# Patient Record
Sex: Male | Born: 1975 | Race: White | Hispanic: No | Marital: Married | Smoking: Former smoker
Health system: Southern US, Community
[De-identification: ages and names within clinical notes are randomized; demographics above are authoritative.]

## PROBLEM LIST (undated history)

## (undated) DIAGNOSIS — M7540 Impingement syndrome of unspecified shoulder: Secondary | ICD-10-CM

## (undated) DIAGNOSIS — K219 Gastro-esophageal reflux disease without esophagitis: Secondary | ICD-10-CM

## (undated) DIAGNOSIS — R519 Headache, unspecified: Secondary | ICD-10-CM

## (undated) DIAGNOSIS — Z8711 Personal history of peptic ulcer disease: Secondary | ICD-10-CM

## (undated) DIAGNOSIS — J342 Deviated nasal septum: Secondary | ICD-10-CM

## (undated) DIAGNOSIS — I10 Essential (primary) hypertension: Secondary | ICD-10-CM

## (undated) DIAGNOSIS — M19019 Primary osteoarthritis, unspecified shoulder: Secondary | ICD-10-CM

## (undated) DIAGNOSIS — M7521 Bicipital tendinitis, right shoulder: Secondary | ICD-10-CM

## (undated) DIAGNOSIS — Z8719 Personal history of other diseases of the digestive system: Secondary | ICD-10-CM

## (undated) DIAGNOSIS — J343 Hypertrophy of nasal turbinates: Secondary | ICD-10-CM

## (undated) HISTORY — PX: CHOLECYSTECTOMY: SHX55

---

## 2008-11-15 ENCOUNTER — Emergency Department: Payer: Self-pay | Admitting: Emergency Medicine

## 2009-08-21 ENCOUNTER — Emergency Department: Payer: Self-pay | Admitting: Emergency Medicine

## 2009-08-22 ENCOUNTER — Ambulatory Visit: Payer: Self-pay | Admitting: Emergency Medicine

## 2009-09-12 ENCOUNTER — Ambulatory Visit: Payer: Self-pay | Admitting: Surgery

## 2009-09-14 LAB — PATHOLOGY REPORT

## 2011-01-26 ENCOUNTER — Emergency Department: Payer: Self-pay | Admitting: Internal Medicine

## 2011-02-27 ENCOUNTER — Ambulatory Visit: Payer: Self-pay | Admitting: Orthopedic Surgery

## 2011-03-14 ENCOUNTER — Ambulatory Visit: Payer: Self-pay | Admitting: Neurology

## 2011-04-25 ENCOUNTER — Ambulatory Visit: Payer: Self-pay | Admitting: Rheumatology

## 2011-06-05 ENCOUNTER — Ambulatory Visit: Payer: Self-pay | Admitting: Neurology

## 2011-06-27 ENCOUNTER — Ambulatory Visit: Payer: Self-pay | Admitting: Physical Medicine and Rehabilitation

## 2011-09-04 ENCOUNTER — Encounter: Payer: Self-pay | Admitting: Physical Medicine and Rehabilitation

## 2011-09-19 ENCOUNTER — Encounter: Payer: Self-pay | Admitting: Physical Medicine and Rehabilitation

## 2011-10-28 ENCOUNTER — Ambulatory Visit: Payer: Self-pay | Admitting: Physical Medicine and Rehabilitation

## 2012-03-27 ENCOUNTER — Ambulatory Visit: Payer: Self-pay | Admitting: Family Medicine

## 2012-04-01 ENCOUNTER — Ambulatory Visit: Payer: Self-pay | Admitting: Family Medicine

## 2012-05-25 ENCOUNTER — Encounter: Payer: Self-pay | Admitting: Family Medicine

## 2012-05-25 ENCOUNTER — Other Ambulatory Visit: Payer: Self-pay | Admitting: *Deleted

## 2012-05-25 ENCOUNTER — Ambulatory Visit (INDEPENDENT_AMBULATORY_CARE_PROVIDER_SITE_OTHER): Payer: BC Managed Care – PPO | Admitting: Family Medicine

## 2012-05-25 VITALS — BP 140/98 | HR 83 | Temp 98.2°F | Ht 68.0 in | Wt 170.5 lb

## 2012-05-25 DIAGNOSIS — M542 Cervicalgia: Secondary | ICD-10-CM

## 2012-05-25 DIAGNOSIS — M797 Fibromyalgia: Secondary | ICD-10-CM

## 2012-05-25 DIAGNOSIS — M549 Dorsalgia, unspecified: Secondary | ICD-10-CM

## 2012-05-25 DIAGNOSIS — G43009 Migraine without aura, not intractable, without status migrainosus: Secondary | ICD-10-CM

## 2012-05-25 DIAGNOSIS — IMO0001 Reserved for inherently not codable concepts without codable children: Secondary | ICD-10-CM

## 2012-05-25 MED ORDER — AMITRIPTYLINE HCL 25 MG PO TABS: 25.0000 mg | ORAL_TABLET | Freq: Every day | ORAL | Status: DC

## 2012-05-25 MED ORDER — DICLOFENAC SODIUM 75 MG PO TBEC: 75.0000 mg | DELAYED_RELEASE_TABLET | Freq: Two times a day (BID) | ORAL | Status: DC

## 2012-05-25 NOTE — Patient Instructions (Addendum)
F/u 6 weeks 

## 2012-05-25 NOTE — Progress Notes (Signed)
Nature conservation officer at [facility] [address] [place] [address] Phone: [phone] Fax: [phone]  Date:  05/25/2012   Name:  Raymond [name]   DOB:  01-Apr-1975   MRN:  [id number] Gender: male Age: 37 y.o.  Primary Physician:  Hannah Beat, MD  Evaluating MD: Hannah Beat, MD   Chief Complaint: Establish Care   History of Present Illness:  Raymond [name] is a 37 y.o. pleasant patient who presents with the following:  Has been going to [facility].   Mom was in hospital with long QT syndrome, then heart in A Fib. Sister has long QT syndrome 1st cousin: long QT syndrome  He has an upcoming appointment with Dr. Graciela Husbands  Fibromyalgia / neck / back / diffuse muscle pains:  He has been through what sounds like a large workup of this. He said many x-rays, and actually has had cervical, thoracic, lumbar, and her RIGHT shoulder MRI. All these he reports to me are normal. He has not been given a formal diagnosis to his knowledge, but he is taking Naprosyn as well as gabapentin 1200 mg total daily. Gabapentin 300 mg 4 times a day --- But does not help  Naproxen -- does not help.   Neck, thoracic, lumbar, and R shoulder MRI - all ok.  Migraines --- gets one about every week. He is not on any prophylactic medicine.  He also has problems with insomnia.  Trigger point inj did not help. Dr. Burnett Sheng -- in [place].  There is no problem list on file for this patient.   Past Medical History  Diagnosis Date  . Migraine   . History of chicken pox     Past Surgical History  Procedure Laterality Date  . Cholecystectomy      History   Social History  . Marital Status: Married    Spouse Name: N/A    Number of Children: N/A  . Years of Education: N/A   Occupational History  . Not on file.   Social History Main Topics  . Smoking status: Current Every Day Smoker  . Smokeless tobacco: Not on file  . Alcohol Use: No  . Drug Use: No  . Sexually Active: Not  on file   Other Topics Concern  . Not on file   Social History Narrative  . No narrative on file    No family history on file.  Allergies  Allergen Reactions  . Azithromycin   . Percocet (Oxycodone-Acetaminophen)   . Vicodin (Hydrocodone-Acetaminophen)     Medication list has been reviewed and updated.  No outpatient prescriptions prior to visit.   No facility-administered medications prior to visit.    Review of Systems:   GEN: No acute illnesses, no fevers, chills. GI: No n/v/d, eating normally Pulm: No SOB Interactive and getting along well at home.  Otherwise, ROS is as per the HPI.   Physical Examination: BP 140/98  Pulse 83  Temp(Src) 98.2 F (36.8 C) (Oral)  Ht 5\' 8"  (1.727 m)  Wt 170 lb 8 oz (77.338 kg)  BMI 25.93 kg/m2  SpO2 97%  Ideal Body Weight: Weight in (lb) to have BMI = 25: 164.1   GEN: WDWN, NAD, Non-toxic, Alert & Oriented x 3 HEENT: Atraumatic, Normocephalic.  Ears and Nose: No external deformity. EXTR: No clubbing/cyanosis/edema NEURO: Normal gait.  PSYCH: Normally interactive. Conversant. Not depressed or anxious appearing.  Calm demeanor.   Shoulder: B Inspection: No muscle wasting or winging Ecchymosis/edema: neg  AC joint, scapula,  clavicle: NT Cervical spine: NT, full ROM Spurling's: neg Abduction: full, 5/5 Flexion: full, 5/5 IR, full, lift-off: 5/5 ER at neutral: full, 5/5 AC crossover and compression: neg Neer: neg Hawkins: neg Drop Test: neg Empty Can: neg Supraspinatus insertion: NT Bicipital groove: NT Speed's: neg Yergason's: neg Sulcus sign: neg Scapular dyskinesis: none C5-T1 intact Sensation intact Grip 5/5  CERVICAL SPINE EXAM Range of motion: Flexion, extension, lateral bending, and rotation: no significant limitation Pain with terminal motion: yes Spinous Processes: NT SCM: NT Upper paracervical muscles: ttp diffusely Upper traps: NT C5-T1 intact, sensation and motor   Assessment and  Plan:  Fibromyalgia  Back pain  Neck pain  Common migraine  Trial of Elavil for both fibromyalgia, migraine, and insomnia. Change from Naprosyn to Voltaren.  Obtain records from his prior physicians. Recheck in 6 weeks.  Orders Today:  No orders of the defined types were placed in this encounter.    Updated Medication List: (Includes new medications, updates to list, dose adjustments) Meds ordered this encounter  Medications  . DISCONTD: NAPROXEN DR 500 MG EC tablet    Sig: Take 1 tablet by mouth daily.  Marland Kitchen DISCONTD: gabapentin (NEURONTIN) 300 MG capsule    Sig: Take 300 mg by mouth 2 (two) times daily.  . diclofenac (VOLTAREN) 75 MG EC tablet    Sig: Take 1 tablet (75 mg total) by mouth 2 (two) times daily.    Dispense:  60 tablet    Refill:  3  . amitriptyline (ELAVIL) 25 MG tablet    Sig: Take 1 tablet (25 mg total) by mouth at bedtime.    Dispense:  30 tablet    Refill:  3    Medications Discontinued: Medications Discontinued During This Encounter  Medication Reason  . NAPROXEN DR 500 MG EC tablet   . gabapentin (NEURONTIN) 300 MG capsule       Signed, Cythia Bachtel T. Derion Kreiter, MD 05/25/2012 2:13 PM

## 2012-05-26 ENCOUNTER — Encounter: Payer: Self-pay | Admitting: Family Medicine

## 2012-05-26 DIAGNOSIS — G43009 Migraine without aura, not intractable, without status migrainosus: Secondary | ICD-10-CM | POA: Insufficient documentation

## 2012-05-26 DIAGNOSIS — M797 Fibromyalgia: Secondary | ICD-10-CM | POA: Insufficient documentation

## 2012-05-26 DIAGNOSIS — M542 Cervicalgia: Secondary | ICD-10-CM | POA: Insufficient documentation

## 2012-05-26 DIAGNOSIS — M549 Dorsalgia, unspecified: Secondary | ICD-10-CM | POA: Insufficient documentation

## 2012-05-28 ENCOUNTER — Telehealth: Payer: Self-pay

## 2012-05-28 NOTE — Telephone Encounter (Signed)
Raymond [name] pts wife said within the last year pt used voltaren gel for elbow pain and took voltaren tabs for back pain with no relief. [name] request substitution med be sent to [facility] instead of voltaren tab for back pain.Please advise.[name] request call back after med sent in.

## 2012-05-29 MED ORDER — MELOXICAM 15 MG PO TABS: 15.0000 mg | ORAL_TABLET | Freq: Every day | ORAL | Status: DC

## 2012-05-29 NOTE — Telephone Encounter (Signed)
PATIENT WIFE ADVISED AND MEDICATION SENT TO PHARMACY

## 2012-05-29 NOTE — Telephone Encounter (Signed)
Call  The treatment of fibromyalgia is complex and there is never 1 medication that will completely work.  Any help is of benefit.  D/c voltaren.  Start mobic 15 mg, 1 po daily, #30, 3 refills

## 2012-06-03 ENCOUNTER — Telehealth: Payer: Self-pay

## 2012-06-03 MED ORDER — TRAMADOL HCL 50 MG PO TABS: 50.0000 mg | ORAL_TABLET | Freq: Four times a day (QID) | ORAL | Status: DC | PRN

## 2012-06-03 MED ORDER — TIZANIDINE HCL 4 MG PO TABS: ORAL_TABLET | ORAL | Status: DC

## 2012-06-03 NOTE — Telephone Encounter (Signed)
Advised patient's wife, meds sent to [facility].

## 2012-06-03 NOTE — Telephone Encounter (Signed)
Call and call in  Zanaflex 4 mg tab, 1 po qhs, #30, 3 refills (muscle relaxant) Tramadol 50 mg, 1 po qid prn pain. #50, 3 refills

## 2012-06-03 NOTE — Telephone Encounter (Signed)
Pt's wife said Mobic is not helping pts upper back pain; tonya rubbed pts' upper back last night and felt thousands of knots in upper back; ? Muscle tension or spasms. Request pain med called to [facility].Please advise.

## 2012-06-12 ENCOUNTER — Ambulatory Visit (INDEPENDENT_AMBULATORY_CARE_PROVIDER_SITE_OTHER): Payer: BC Managed Care – PPO | Admitting: Internal Medicine

## 2012-06-12 ENCOUNTER — Encounter: Payer: Self-pay | Admitting: Internal Medicine

## 2012-06-12 VITALS — BP 150/98 | HR 74 | Ht 68.0 in | Wt 167.8 lb

## 2012-06-12 DIAGNOSIS — I469 Cardiac arrest, cause unspecified: Secondary | ICD-10-CM

## 2012-06-12 DIAGNOSIS — R002 Palpitations: Secondary | ICD-10-CM

## 2012-06-12 DIAGNOSIS — Z8249 Family history of ischemic heart disease and other diseases of the circulatory system: Secondary | ICD-10-CM

## 2012-06-12 DIAGNOSIS — F17200 Nicotine dependence, unspecified, uncomplicated: Secondary | ICD-10-CM

## 2012-06-12 DIAGNOSIS — R55 Syncope and collapse: Secondary | ICD-10-CM

## 2012-06-12 NOTE — Progress Notes (Signed)
ELECTROPHYSIOLOGY CONSULT NOTE  Patient ID: Raymond [name], MRN: [id number], DOB/AGE: 1975/03/08 37 y.o. Admit date: (Not on file) Date of Consult: 06/12/2012  Primary Physician: Hannah Beat, MD Primary Cardiologist: new  Chief Complaint: LONGqt   HPI Raymond [name] is a 37 y.o. male  A seen as part of the evaluation of [facility] with known long QT syndrome . Specifically his mother had polymorphic ventricular tachycardia and with his mother and his sister have QT intervals of greater than 500 ms. His mother is status post ICD implantation.  He had aborted sudden death as an infan t the details of which are unavailable  He also has a history of recurrence syncopeThese episodes occurred at work they're associated with diaphoresis nausea and flushing with residual orthostatic intoleranceshe   Past Medical History  Diagnosis Date  . Migraine   . Fibromyalgia       Surgical History:  Past Surgical History  Procedure Laterality Date  . Cholecystectomy       Home Meds: Prior to Admission medications   Medication Sig Start Date End Date Taking? Authorizing Provider  amitriptyline (ELAVIL) 25 MG tablet Take 1 tablet (25 mg total) by mouth at bedtime. 05/25/12  Yes Hannah Beat, MD  Cholecalciferol (VITAMIN D PO) Take 5,000 mg by mouth daily.   Yes Historical Provider, MD  meloxicam (MOBIC) 15 MG tablet Take 1 tablet (15 mg total) by mouth daily. 05/29/12  Yes [name], MD  tiZANidine (ZANAFLEX) 4 MG tablet Take one by mouth at bedtime. 06/03/12  Yes Hannah Beat, MD  traMADol (ULTRAM) 50 MG tablet Take 1 tablet (50 mg total) by mouth every 6 (six) hours as needed for pain. 06/03/12  Yes Hannah Beat, MD      Allergies:  Allergies  Allergen Reactions  . Azithromycin   . Percocet (Oxycodone-Acetaminophen)   . Vicodin (Hydrocodone-Acetaminophen)     History   Social History  . Marital Status: Married    Spouse Name: N/A    Number of Children: N/A  . Years  of Education: N/A   Occupational History  . Not on file.   Social History Main Topics  . Smoking status: Current Every Day Smoker    Types: Cigarettes  . Smokeless tobacco: Never Used  . Alcohol Use: Yes  . Drug Use: No  . Sexually Active: Yes -- Male partner(s)   Other Topics Concern  . Not on file   Social History Narrative   Mom was in hospital with long QT syndrome, then heart in A Fib.   Sister has long QT syndrome   1st cousin: long QT syndrome           No family history on file.   ROS:  Please see the history of present illness.   Negative except fibormalgia  All other systems reviewed and negative.    Physical Exam:   Blood pressure 150/98, pulse 74, height 5\' 8"  (1.727 m), weight 167 lb 12.8 oz (76.114 kg). General: Well developed, well nourished male in no acute distress. Head: Normocephalic, atraumatic, sclera non-icteric, no xanthomas, nares are without discharge. EENT: normal Lymph Nodes:  none Back: without scoliosis/kyphosis , no CVA tendersness Neck: Negative for carotid bruits. JVD not elevated. Lungs: Clear bilaterally to auscultation without wheezes, rales, or rhonchi. Breathing is unlabored. Heart: RRR with S1 S2. No murmur , rubs, or gallops appreciated. Abdomen: Soft, non-tender, non-distended with normoactive bowel sounds. No hepatomegaly. No rebound/guarding. No obvious abdominal masses. Msk:  Strength and  tone appear normal for age. Extremities: No clubbing or cyanosis. No edema.  Distal pedal pulses are 2+ and equal bilaterally. Skin: Warm and Dry Neuro: Alert and oriented X 3. CN III-XII intact Grossly normal sensory and motor function . Psych:  Responds to questions appropriately with a normal affect.       EKG: *   Assessment and Plan:    Sherryl Manges

## 2012-06-12 NOTE — Patient Instructions (Addendum)
Your physician recommends that you schedule a follow-up appointment in: 6 weeks with Dr Graciela Husbands to go over event monitor  Your physician has recommended that you wear an event monitor. Event monitors are medical devices that record the heart's electrical activity. Doctors most often [place] these monitors to diagnose arrhythmias. Arrhythmias are problems with the speed or rhythm of the heartbeat. The monitor is a small, portable device. You can wear one while you do your normal daily activities. This is usually used to diagnose what is causing palpitations/syncope (passing out).

## 2012-06-12 NOTE — Assessment & Plan Note (Addendum)
The patient apparently has a history of aborted cardiac arrest occurring one month. 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 I do not think his syncopal episodes represent polymorphic ventricular tachycardia given her duration. genetic testing is a delay in the [facility]. In the event that we identify  A probe, we will explore the rest of the family including this patient  Notably, they have 4 children none of whom has had syncope

## 2012-06-12 NOTE — Assessment & Plan Note (Signed)
As above.

## 2012-06-12 NOTE — Assessment & Plan Note (Signed)
We have discussed strategies to try to discontinue smoking including lozenges and patches. I've also given the number for the state smoking cessation program

## 2012-06-17 ENCOUNTER — Telehealth: Payer: Self-pay

## 2012-06-17 NOTE — Telephone Encounter (Signed)
pts wife, Archie Patten left v/m pt saw Dr Graciela Husbands and the Elavil was discontinued. Pts mother has long QT syndrome. Pt to stop Elavil until decide if pt has QT syndrome. Pt has f/u with Dr Patsy Lager on 07/08/12. Archie Patten does not require call back.

## 2012-06-17 NOTE — Telephone Encounter (Signed)
Noted   Hannah Beat, MD 06/17/2012, 5:05 PM

## 2012-06-19 ENCOUNTER — Ambulatory Visit (INDEPENDENT_AMBULATORY_CARE_PROVIDER_SITE_OTHER): Payer: BC Managed Care – PPO

## 2012-06-19 DIAGNOSIS — R002 Palpitations: Secondary | ICD-10-CM

## 2012-06-19 NOTE — Progress Notes (Signed)
Placed a 30 day event monitor on patient 

## 2012-06-22 ENCOUNTER — Telehealth: Payer: Self-pay

## 2012-06-22 NOTE — Telephone Encounter (Signed)
Call  This was used more for pain management and headaches - with added se of making sleepy.  Until given ok by Dr. Graciela Husbands, short term Remus Loffler use is reasonable.  Ambien 10 mg, 1/2-1 tab po 30 minutes before bed., #30, 1 refill

## 2012-06-22 NOTE — Telephone Encounter (Signed)
pts wife request med to help pt sleep that is not on Long QT list(per Dr Graciela Husbands). Pt could not take Elavil. [facility].

## 2012-06-23 NOTE — Telephone Encounter (Signed)
rx called to pharmacy. Patients wife advised

## 2012-06-30 ENCOUNTER — Telehealth: Payer: Self-pay | Admitting: Internal Medicine

## 2012-06-30 NOTE — Telephone Encounter (Signed)
New problem    Pt having muscle spasms and wants to know what he can take

## 2012-06-30 NOTE — Telephone Encounter (Signed)
Spoke with pt wife, she reports the muscle in his forearm is jumping and they wonder what he can take. Explained he could take a muscle relaxer but he would need to get that from his PCP. She voiced understanding and states he already has a scheduled appt with them.

## 2012-07-07 ENCOUNTER — Encounter: Payer: Self-pay | Admitting: Radiology

## 2012-07-08 ENCOUNTER — Encounter: Payer: Self-pay | Admitting: Family Medicine

## 2012-07-08 ENCOUNTER — Ambulatory Visit (INDEPENDENT_AMBULATORY_CARE_PROVIDER_SITE_OTHER): Payer: BC Managed Care – PPO | Admitting: Family Medicine

## 2012-07-08 VITALS — BP 130/80 | HR 72 | Temp 98.6°F | Ht 68.0 in | Wt 168.0 lb

## 2012-07-08 DIAGNOSIS — M797 Fibromyalgia: Secondary | ICD-10-CM

## 2012-07-08 DIAGNOSIS — IMO0001 Reserved for inherently not codable concepts without codable children: Secondary | ICD-10-CM

## 2012-07-08 MED ORDER — PREGABALIN 75 MG PO CAPS: 75.0000 mg | ORAL_CAPSULE | Freq: Every day | ORAL | Status: DC

## 2012-07-08 NOTE — Progress Notes (Signed)
Nature conservation officer at [facility] [address] [place] [address] Phone: [phone] Fax: [phone]  Date:  07/08/2012   Name:  Raymond [name]   DOB:  Jul 16, 1975   MRN:  [id number] Gender: male Age: 37 y.o.  Primary Physician:  Hannah Beat, MD  Evaluating MD: Hannah Beat, MD   Chief Complaint: Follow-up   History of Present Illness:  Raymond [name] is a 37 y.o. pleasant patient who presents with the following:  05/25/2012 OV: Extensive record review. The patient has been treated by orthopedics, rheumatology, physical medicine and rehabilitation, chiropractor, physical therapy, and none of these have really worked at all.  MRI reports scanned in the system and reviewed. Prior notes reviewed.  Recently, we tried the patient on some Zanaflex, Mobic, and initially we tried some Elavil, but we discontinued this  On Dr. Odessa Fleming recommendation while he is undergoing a workup for long QT syndrome.  The tramadol does help somewhat. Elavil made him feel agitated and stay up later than normal. He also recently gave him some Ambien, which did help somewhat with some sleep.  He has also previously been on high-dose gabapentin, which did not help at all.   Has been going to [facility].   Mom was in hospital with long QT syndrome, then heart in A Fib. Sister has long QT syndrome 1st cousin: long QT syndrome  He has an upcoming appointment with Dr. Graciela Husbands  Fibromyalgia / neck / back / diffuse muscle pains:   He has been through what sounds like a large workup of this. He said many x-rays, and actually has had cervical, thoracic, lumbar, and her RIGHT shoulder MRI. All these he reports to me are normal. He has not been given a formal diagnosis to his knowledge, but he is taking Naprosyn as well as gabapentin 1200 mg total daily. Gabapentin 300 mg 4 times a day --- But does not help  Naproxen -- does not help.   Neck, thoracic, lumbar, and R shoulder MRI - all  ok.  Migraines --- gets one about every week. He is not on any prophylactic medicine.  He also has problems with insomnia.  Trigger point inj did not help. Dr. Burnett Sheng -- in [place].   Patient Active Problem List   Diagnosis Date Noted  . Syncope 06/12/2012  . Family history of long QT syndrome 06/12/2012  . 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 Smoker 06/12/2012  . Common migraine 05/26/2012  . Neck pain 05/26/2012  . Back pain 05/26/2012  . Fibromyalgia 05/26/2012    Past Medical History  Diagnosis Date  . Migraine   . Fibromyalgia     Past Surgical History  Procedure Laterality Date  . Cholecystectomy      History   Social History  . Marital Status: Married    Spouse Name: N/A    Number of Children: N/A  . Years of Education: N/A   Occupational History  . Not on file.   Social History Main Topics  . Smoking status: Current Every Day Smoker    Types: Cigarettes  . Smokeless tobacco: Never Used  . Alcohol Use: Yes  . Drug Use: No  . Sexually Active: Yes -- Male partner(s)   Other Topics Concern  . Not on file   Social History Narrative   Mom was in hospital with long QT syndrome, then heart in A Fib.   Sister has long QT syndrome   1st cousin: long QT syndrome  No family history on file.  Allergies  Allergen Reactions  . Azithromycin   . Percocet (Oxycodone-Acetaminophen)   . Vicodin (Hydrocodone-Acetaminophen)     Medication list has been reviewed and updated.  Outpatient Prescriptions Prior to Visit  Medication Sig Dispense Refill  . Cholecalciferol (VITAMIN D PO) Take 5,000 mg by mouth daily.      . meloxicam (MOBIC) 15 MG tablet Take 1 tablet (15 mg total) by mouth daily.  30 tablet  3  . tiZANidine (ZANAFLEX) 4 MG tablet Take one by mouth at bedtime.  30 tablet  3  . traMADol (ULTRAM) 50 MG tablet Take 1 tablet (50 mg total) by mouth every 6 (six) hours as needed for pain.  50 tablet  3  . amitriptyline (ELAVIL) 25 MG tablet  Take 1 tablet (25 mg total) by mouth at bedtime.  30 tablet  3   No facility-administered medications prior to visit.    Review of Systems:   GEN: No fevers, chills. Nontoxic. Primarily MSK c/o today. MSK: Detailed in the HPI GI: tolerating PO intake without difficulty Neuro: No numbness, parasthesias, or tingling associated. Otherwise the pertinent positives of the ROS are noted above.    Physical Examination: BP 130/80  Pulse 72  Temp(Src) 98.6 F (37 C) (Oral)  Ht 5\' 8"  (1.727 m)  Wt 168 lb (76.204 kg)  BMI 25.55 kg/m2  SpO2 97%  Ideal Body Weight: Weight in (lb) to have BMI = 25: 164.1   GEN: WDWN, NAD, Non-toxic, Alert & Oriented x 3 HEENT: Atraumatic, Normocephalic.  Ears and Nose: No external deformity. EXTR: No clubbing/cyanosis/edema NEURO: Normal gait.  PSYCH: Normally interactive. Conversant. Not depressed or anxious appearing.  Calm demeanor.   Shoulder: R Inspection: No muscle wasting or winging Ecchymosis/edema: neg  AC joint, scapula, clavicle: NT Cervical spine: NT, full ROM Spurling's: neg Abduction: full, 5/5 Flexion: full, 5/5 IR, full, lift-off: 5/5 ER at neutral: full, 5/5 AC crossover and compression: neg Neer: neg Hawkins: neg Drop Test: neg Empty Can: neg Supraspinatus insertion: NT Bicipital groove: NT Speed's: neg Yergason's: neg Sulcus sign: neg Scapular dyskinesis: none C5-T1 intact Sensation intact Grip 5/5  Cervical spine and lumbar spine with full range of motion. Nontender throughout the spinous processes.  Assessment and Plan:  Fibromyalgia   >25 minutes spent in face to face time with patient, >50% spent in counselling or coordination of care: challenging case. He has undergone an extensive workup radiographically as well as a laboratory workup by multiple specialists. Both shoulders are incredibly strong, and clinically does not seem consistent with rotator cuff pathology based on examination.  Overarching pattern  seems to be most consistent with fibromyalgia. Emphasized importance of sleep, exercise, and we can try various neuropathic agents. I encouraged him the tramadol is safe and has a high success rate in fibromyalgia. Trial of Lyrica 75 mg at night, then increase to b.i.d. On the weekend and assess level of drowsiness. We can titrate this up to affect.  Followup in 6-8 weeks.  Orders Today:  No orders of the defined types were placed in this encounter.    Updated Medication List: (Includes new medications, updates to list, dose adjustments) Meds ordered this encounter  Medications  . zolpidem (AMBIEN) 10 MG tablet    Sig: Take 1 tablet by mouth daily.  . pregabalin (LYRICA) 75 MG capsule    Sig: Take 1 capsule (75 mg total) by mouth at bedtime.    Dispense:  30 capsule  Refill:  5    Medications Discontinued: Medications Discontinued During This Encounter  Medication Reason  . amitriptyline (ELAVIL) 25 MG tablet Error  . tiZANidine (ZANAFLEX) 4 MG tablet      Signed, Salaya Holtrop T. Fuquan Wilson, MD 07/08/2012 3:57 PM

## 2012-07-28 ENCOUNTER — Encounter: Payer: Self-pay | Admitting: Internal Medicine

## 2012-07-28 ENCOUNTER — Ambulatory Visit (INDEPENDENT_AMBULATORY_CARE_PROVIDER_SITE_OTHER): Payer: BC Managed Care – PPO | Admitting: Internal Medicine

## 2012-07-28 VITALS — BP 127/90 | HR 65 | Ht 68.0 in | Wt 166.2 lb

## 2012-07-28 DIAGNOSIS — I469 Cardiac arrest, cause unspecified: Secondary | ICD-10-CM

## 2012-07-28 DIAGNOSIS — Z8249 Family history of ischemic heart disease and other diseases of the circulatory system: Secondary | ICD-10-CM

## 2012-07-28 NOTE — Progress Notes (Signed)
Patient Care Team: Hannah Beat, MD as PCP - General (Family Medicine)   HPI  Raymond [name] is a 37 y.o. male Seen in followup for palpitations. As part of an evaluation of [facility] with known long QT syndrome. Patient himself had an aborted cardiac event as an infant the details of which are not  Await gene testing although phone calll?? Suggested negative  Past Medical History  Diagnosis Date  . Migraine   . Fibromyalgia     Past Surgical History  Procedure Laterality Date  . Cholecystectomy      Current Outpatient Prescriptions  Medication Sig Dispense Refill  . Cholecalciferol (VITAMIN D PO) Take 5,000 mg by mouth daily.      . meloxicam (MOBIC) 15 MG tablet Take 1 tablet (15 mg total) by mouth daily.  30 tablet  3  . pregabalin (LYRICA) 75 MG capsule Take 1 capsule (75 mg total) by mouth at bedtime.  30 capsule  5  . traMADol (ULTRAM) 50 MG tablet Take 1 tablet (50 mg total) by mouth every 6 (six) hours as needed for pain.  50 tablet  3  . zolpidem (AMBIEN) 10 MG tablet Take 1 tablet by mouth daily.       No current facility-administered medications for this visit.    Allergies  Allergen Reactions  . Azithromycin   . Percocet (Oxycodone-Acetaminophen)   . Vicodin (Hydrocodone-Acetaminophen)     Review of Systems negative except from HPI and PMH  Physical Exam BP 127/90  Pulse 65  Ht 5\' 8"  (1.727 m)  Wt 166 lb 3.2 oz (75.388 kg)  BMI 25.28 kg/m2 Well developed and nourished in no acute distress HENT normal Neck supple with JVP-flat Clear Regular rate and rhythm, no murmurs or gallops Abd-soft with active BS No Clubbing cyanosis edema Skin-warm and dry A & Oriented  Grossly normal sensory and motor function  Event recorder reviewed. Rare PVCs. Symptoms of chest pressure and palpitations are associated with sinus rhythm and occasionally with PVCs   Assessment and  Plan

## 2012-07-28 NOTE — Assessment & Plan Note (Signed)
Details still not available.

## 2012-07-28 NOTE — Assessment & Plan Note (Signed)
First word that the gene screen was negative. We'll need to consider the use of exercise testing to evaluate likelihood in the rest of the [facility]

## 2012-08-05 ENCOUNTER — Encounter: Payer: Self-pay | Admitting: Family Medicine

## 2012-08-05 ENCOUNTER — Ambulatory Visit (INDEPENDENT_AMBULATORY_CARE_PROVIDER_SITE_OTHER): Payer: BC Managed Care – PPO | Admitting: Family Medicine

## 2012-08-05 VITALS — BP 112/76 | HR 68 | Temp 98.1°F | Wt 168.5 lb

## 2012-08-05 DIAGNOSIS — M542 Cervicalgia: Secondary | ICD-10-CM

## 2012-08-05 MED ORDER — PREGABALIN 75 MG PO CAPS: 75.0000 mg | ORAL_CAPSULE | Freq: Two times a day (BID) | ORAL | Status: DC

## 2012-08-05 NOTE — Patient Instructions (Addendum)
[email or web address]

## 2012-08-05 NOTE — Progress Notes (Signed)
Nature conservation officer at [facility] [address] [place] [address] Phone: [phone] Fax: [phone]  Date:  08/05/2012   Name:  Raymond [name]   DOB:  Feb 10, 1976   MRN:  [id number] Gender: male Age: 37 y.o.  Primary Physician:  Hannah Beat, MD  Evaluating MD: Hannah Beat, MD   Chief Complaint: Neck Injury   History of Present Illness:  Raymond [name] is a 37 y.o. pleasant patient who presents with the following:  Last Thursday, couple days --- 6 days ago. Has been getting better, still has a burning.  Nice patient with a history of chronic pain, fibromyalgia, wrestling with son, caught in a guillotine, and now has posterior neck pain, mostly on the left. Has been improving. Taking some mobic.  Overall, pain is getting better since starting lyrica.  Cont with tramadol prn  Patient Active Problem List   Diagnosis Date Noted  . Syncope 06/12/2012  . Family history of long QT syndrome 06/12/2012  . 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 Smoker 06/12/2012  . Common migraine 05/26/2012  . Neck pain 05/26/2012  . Back pain 05/26/2012  . Fibromyalgia 05/26/2012    Past Medical History  Diagnosis Date  . Migraine   . Fibromyalgia     Past Surgical History  Procedure Laterality Date  . Cholecystectomy      History   Social History  . Marital Status: Married    Spouse Name: N/A    Number of Children: N/A  . Years of Education: N/A   Occupational History  . Not on file.   Social History Main Topics  . Smoking status: Former Smoker    Types: Cigarettes    Quit date: 06/12/2012  . Smokeless tobacco: Never Used  . Alcohol Use: No  . Drug Use: No  . Sexually Active: Yes -- Male partner(s)   Other Topics Concern  . Not on file   Social History Narrative   Mom was in hospital with long QT syndrome, then heart in A Fib.   Sister has long QT syndrome   1st cousin: long QT syndrome          No family history on file.  Allergies    Allergen Reactions  . Azithromycin   . Percocet (Oxycodone-Acetaminophen)   . Vicodin (Hydrocodone-Acetaminophen)     Medication list has been reviewed and updated.  Outpatient Prescriptions Prior to Visit  Medication Sig Dispense Refill  . Cholecalciferol (VITAMIN D PO) Take 5,000 mg by mouth daily.      . meloxicam (MOBIC) 15 MG tablet Take 1 tablet (15 mg total) by mouth daily.  30 tablet  3  . pregabalin (LYRICA) 75 MG capsule Take 1 capsule (75 mg total) by mouth at bedtime.  30 capsule  5  . traMADol (ULTRAM) 50 MG tablet Take 1 tablet (50 mg total) by mouth every 6 (six) hours as needed for pain.  50 tablet  3  . zolpidem (AMBIEN) 10 MG tablet Take 1 tablet by mouth daily.       No facility-administered medications prior to visit.    Review of Systems:   GEN: No fevers, chills. Nontoxic. Primarily MSK c/o today. MSK: Detailed in the HPI GI: tolerating PO intake without difficulty Neuro: No numbness, parasthesias, or tingling associated. Otherwise the pertinent positives of the ROS are noted above.    Physical Examination: BP 112/76  Pulse 68  Temp(Src) 98.1 F (36.7 C) (Oral)  Wt 168 lb 8 oz (76.431  kg)  BMI 25.63 kg/m2  Ideal Body Weight:     GEN: Well-developed,well-nourished,in no acute distress; alert,appropriate and cooperative throughout examination HEENT: Normocephalic and atraumatic without obvious abnormalities. Ears, externally no deformities PULM: Breathing comfortably in no respiratory distress EXT: No clubbing, cyanosis, or edema PSYCH: Normally interactive. Cooperative during the interview. Pleasant. Friendly and conversant. Not anxious or depressed appearing. Normal, full affect.  CERVICAL SPINE EXAM Range of motion: Flexion, extension, lateral bending, and rotation: mild forward flexion rest only Pain with terminal motion: mild lateral and flexion Spinous Processes: NT SCM: NT Upper paracervical muscles: ttp post Upper traps: NT C5-T1  intact, sensation and motor   Assessment and Plan:  Neck pain on left side  C/w moist heat, massage, mobic, tramadol Increase lyrica to bid  Orders Today:  No orders of the defined types were placed in this encounter.    Updated Medication List: (Includes new medications, updates to list, dose adjustments) Meds ordered this encounter  Medications  . pregabalin (LYRICA) 75 MG capsule    Sig: Take 1 capsule (75 mg total) by mouth 2 (two) times daily.    Dispense:  60 capsule    Refill:  5    Medications Discontinued: Medications Discontinued During This Encounter  Medication Reason  . pregabalin (LYRICA) 75 MG capsule Reorder      Signed, Karleen Hampshire T. Tasheba Henson, MD 08/05/2012 9:59 AM

## 2012-09-18 ENCOUNTER — Telehealth: Payer: Self-pay | Admitting: *Deleted

## 2012-09-18 NOTE — Telephone Encounter (Signed)
Pt's wife states pt is asking if his lyrica dose can be increased to two twice a day.  It seems to be helping him, but pt feels he needs a higher dose, pain is worse around lunch time.  Please advise.  [facility]  Pt has appt to see you on Monday.

## 2012-09-19 NOTE — Telephone Encounter (Signed)
Yes, i will discuss on Monday with him.

## 2012-09-21 ENCOUNTER — Encounter: Payer: Self-pay | Admitting: Family Medicine

## 2012-09-21 ENCOUNTER — Ambulatory Visit (INDEPENDENT_AMBULATORY_CARE_PROVIDER_SITE_OTHER): Payer: BC Managed Care – PPO | Admitting: Family Medicine

## 2012-09-21 VITALS — BP 100/64 | HR 80 | Temp 98.2°F | Ht 68.0 in | Wt 168.8 lb

## 2012-09-21 DIAGNOSIS — IMO0001 Reserved for inherently not codable concepts without codable children: Secondary | ICD-10-CM

## 2012-09-21 DIAGNOSIS — M797 Fibromyalgia: Secondary | ICD-10-CM

## 2012-09-21 DIAGNOSIS — M542 Cervicalgia: Secondary | ICD-10-CM

## 2012-09-21 DIAGNOSIS — M549 Dorsalgia, unspecified: Secondary | ICD-10-CM

## 2012-09-21 MED ORDER — SILDENAFIL CITRATE 100 MG PO TABS: 100.0000 mg | ORAL_TABLET | ORAL | Status: DC | PRN

## 2012-09-21 MED ORDER — PREGABALIN 100 MG PO CAPS: 100.0000 mg | ORAL_CAPSULE | Freq: Three times a day (TID) | ORAL | Status: DC

## 2012-09-21 NOTE — Progress Notes (Signed)
Nature conservation officer at [facility] [address] [place] [address] Phone: [phone] Fax: [phone]  Date:  09/21/2012   Name:  Raymond [name]   DOB:  24-Sep-1975   MRN:  [id number] Gender: male Age: 37 y.o.  Primary Physician:  Hannah Beat, MD  Evaluating MD: Hannah Beat, MD   Chief Complaint: Follow-up   History of Present Illness:  Raymond [name] is a 37 y.o. pleasant patient who presents with the following:  Last couple of days, has had some bad days, some swelling and felt like has been hard to swallow.  His been having more global pain and more neck pain.  He also wanted to Dr. about some erectile dysfunction. He is been having some problems over the last 2 or 3 years. Is the first time he is brought up to me. He is able to achieve erections sometimes, but he does lose his sometimes.   Patient Active Problem List   Diagnosis Date Noted  . Syncope 06/12/2012  . Family history of long QT syndrome 06/12/2012  . 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 Smoker 06/12/2012  . Common migraine 05/26/2012  . Neck pain 05/26/2012  . Back pain 05/26/2012  . Fibromyalgia 05/26/2012    Past Medical History  Diagnosis Date  . Migraine   . Fibromyalgia     Past Surgical History  Procedure Laterality Date  . Cholecystectomy      History   Social History  . Marital Status: Married    Spouse Name: N/A    Number of Children: N/A  . Years of Education: N/A   Occupational History  . Not on file.   Social History Main Topics  . Smoking status: Former Smoker    Types: Cigarettes    Quit date: 06/12/2012  . Smokeless tobacco: Never Used  . Alcohol Use: No  . Drug Use: No  . Sexually Active: Yes -- Male partner(s)   Other Topics Concern  . Not on file   Social History Narrative   Mom was in hospital with long QT syndrome, then heart in A Fib.   Sister has long QT syndrome   1st cousin: long QT syndrome          No family history on  file.  Allergies  Allergen Reactions  . Azithromycin   . Percocet (Oxycodone-Acetaminophen)   . Vicodin (Hydrocodone-Acetaminophen)     Medication list has been reviewed and updated.  Outpatient Prescriptions Prior to Visit  Medication Sig Dispense Refill  . Cholecalciferol (VITAMIN D PO) Take 5,000 mg by mouth daily.      . meloxicam (MOBIC) 15 MG tablet Take 1 tablet (15 mg total) by mouth daily.  30 tablet  3  . pregabalin (LYRICA) 75 MG capsule Take 1 capsule (75 mg total) by mouth 2 (two) times daily.  60 capsule  5  . traMADol (ULTRAM) 50 MG tablet Take 1 tablet (50 mg total) by mouth every 6 (six) hours as needed for pain.  50 tablet  3  . zolpidem (AMBIEN) 10 MG tablet Take 1 tablet by mouth daily.       No facility-administered medications prior to visit.    Review of Systems:  As above. No fevers, chills, sweats. No URI symptoms. No chest pain.  Physical Examination: BP 100/64  Pulse 80  Temp(Src) 98.2 F (36.8 C) (Oral)  Ht 5\' 8"  (1.727 m)  Wt 168 lb 12 oz (76.544 kg)  BMI 25.66 kg/m2  Ideal Body  Weight: Weight in (lb) to have BMI = 25: 164.1   GEN: WDWN, NAD, Non-toxic, Alert & Oriented x 3 HEENT: Atraumatic, Normocephalic.  Ears and Nose: No external deformity. EXTR: No clubbing/cyanosis/edema NEURO: Normal gait.  PSYCH: Normally interactive. Conversant. Not depressed or anxious appearing.  Calm demeanor.  Neck range of motion is full. Nontender no pain. Mild tenderness in the anterior aspect of the sternocleidomastoid. I do not appreciate any significant enlargement in the submental or parotid gland. No significant lymphadenopathy.  Assessment and Plan:  Neck pain  Back pain  Fibromyalgia  Titrate up the patient's dose of Lyrica. Trial of Viagra.  Orders Today:  No orders of the defined types were placed in this encounter.    Updated Medication List: (Includes new medications, updates to list, dose adjustments) Meds ordered this encounter   Medications  . pregabalin (LYRICA) 100 MG capsule    Sig: Take 1 capsule (100 mg total) by mouth 3 (three) times daily.    Dispense:  90 capsule    Refill:  5  . sildenafil (VIAGRA) 100 MG tablet    Sig: Take 1 tablet (100 mg total) by mouth as needed for erectile dysfunction.    Dispense:  10 tablet    Refill:  11    Medications Discontinued: Medications Discontinued During This Encounter  Medication Reason  . pregabalin (LYRICA) 75 MG capsule Reorder      Signed, Karleen Hampshire T. Alante Tolan, MD 09/21/2012 10:38 AM

## 2012-10-09 ENCOUNTER — Other Ambulatory Visit: Payer: Self-pay | Admitting: Family Medicine

## 2012-11-16 ENCOUNTER — Other Ambulatory Visit: Payer: Self-pay | Admitting: Family Medicine

## 2012-11-16 NOTE — Telephone Encounter (Signed)
#  50, 5 ref  ok

## 2012-11-16 NOTE — Telephone Encounter (Signed)
Called to pharmacy 

## 2012-11-16 NOTE — Telephone Encounter (Signed)
Last OV 09/21/2012.  Ok to refill?

## 2012-11-20 ENCOUNTER — Other Ambulatory Visit: Payer: Self-pay | Admitting: Family Medicine

## 2012-11-20 NOTE — Telephone Encounter (Signed)
Last office visit 09/21/2012.  Ok to refill? 

## 2012-11-20 NOTE — Telephone Encounter (Signed)
Called to [facility] 

## 2012-11-20 NOTE — Telephone Encounter (Signed)
Ok to refill #30, 3 refills

## 2012-12-24 ENCOUNTER — Other Ambulatory Visit: Payer: Self-pay

## 2013-01-18 ENCOUNTER — Encounter: Payer: Self-pay | Admitting: Family Medicine

## 2013-01-18 ENCOUNTER — Ambulatory Visit (INDEPENDENT_AMBULATORY_CARE_PROVIDER_SITE_OTHER): Payer: BC Managed Care – PPO | Admitting: Family Medicine

## 2013-01-18 VITALS — BP 140/100 | HR 69 | Temp 98.2°F | Ht 68.0 in | Wt 185.2 lb

## 2013-01-18 DIAGNOSIS — J309 Allergic rhinitis, unspecified: Secondary | ICD-10-CM

## 2013-01-18 DIAGNOSIS — M797 Fibromyalgia: Secondary | ICD-10-CM

## 2013-01-18 DIAGNOSIS — IMO0001 Reserved for inherently not codable concepts without codable children: Secondary | ICD-10-CM

## 2013-01-18 DIAGNOSIS — H6692 Otitis media, unspecified, left ear: Secondary | ICD-10-CM

## 2013-01-18 DIAGNOSIS — H6690 Otitis media, unspecified, unspecified ear: Secondary | ICD-10-CM

## 2013-01-18 MED ORDER — PREGABALIN 150 MG PO CAPS: 150.0000 mg | ORAL_CAPSULE | Freq: Three times a day (TID) | ORAL | Status: DC

## 2013-01-18 MED ORDER — FLUTICASONE PROPIONATE 50 MCG/ACT NA SUSP: 2.0000 | Freq: Every day | NASAL | Status: DC

## 2013-01-18 MED ORDER — AMOXICILLIN-POT CLAVULANATE 875-125 MG PO TABS: 1.0000 | ORAL_TABLET | Freq: Two times a day (BID) | ORAL | Status: DC

## 2013-01-18 NOTE — Progress Notes (Signed)
Date:  01/18/2013   Name:  Raymond [name]   DOB:  May 01, 1975   MRN:  [id number] Gender: male Age: 37 y.o.  Primary Physician:  Hannah Beat, MD   Chief Complaint: Otalgia   Subjective:   History of Present Illness:  Raymond [name] is a 37 y.o. very pleasant male patient who presents with the following:  Ears and sinuses are swollen and left ear is hurting. A lot of congestion, pressure, and pain behind his left ear. Some sinus congestion. Mild coughing.  L OM AR, swollen sinuses  BP Readings from Last 3 Encounters:  01/18/13 140/100  09/21/12 100/64  08/05/12 112/76   More neck pain / pain in general, but the lyrica is working better for him than anything else before.   Past Medical History, Surgical History, Social History, Family History, Problem List, Medications, and Allergies have been reviewed and updated if relevant.  Review of Systems: ROS: GEN: Acute illness details above GI: Tolerating PO intake GU: maintaining adequate hydration and urination Pulm: No SOB Interactive and getting along well at home.  Otherwise, ROS is as per the HPI.   Objective:   Physical Examination: BP 140/100  Pulse 69  Temp(Src) 98.2 F (36.8 C) (Oral)  Ht 5\' 8"  (1.727 m)  Wt 185 lb 4 oz (84.029 kg)  BMI 28.17 kg/m2   Gen: WDWN, NAD; A & O x3, cooperative. Pleasant.Globally Non-toxic HEENT: Normocephalic and atraumatic. Throat clear, w/o exudate, R TM clear, L TM - obscured landmarks, bulging. rhinnorhea.  MMM Frontal sinuses: NT Max sinuses: NT NECK: Anterior cervical  LAD is absent CV: RRR, No M/G/R, cap refill <2 sec PULM: Breathing comfortably in no respiratory distress. no wheezing, crackles, rhonchi EXT: No c/c/e PSYCH: Friendly, good eye contact MSK: Nml gait     Laboratory and Imaging Data:  Assessment & Plan:    Left otitis media  Fibromyalgia  Allergic rhinitis  Restart flonase Start augmentin Increase lyrica  There are no Patient  Instructions on file for this visit.  Orders Today:  No orders of the defined types were placed in this encounter.    New medications, updates to list, dose adjustments: Meds ordered this encounter  Medications  . amoxicillin-clavulanate (AUGMENTIN) 875-125 MG per tablet    Sig: Take 1 tablet by mouth 2 (two) times daily.    Dispense:  20 tablet    Refill:  0  . fluticasone (FLONASE) 50 MCG/ACT nasal spray    Sig: Place 2 sprays into both nostrils daily.    Dispense:  16 g    Refill:  12  . pregabalin (LYRICA) 150 MG capsule    Sig: Take 1 capsule (150 mg total) by mouth 3 (three) times daily.    Dispense:  90 capsule    Refill:  5    Signed,  Nolin Grell T. Ysabel Cowgill, MD, CAQ Sports Medicine  [facility] at [facility] [address] [place] [address] Phone: [phone] Fax: [phone]  Updated Complete Medication List:   Medication List       This list is accurate as of: 01/18/13  2:08 PM.  Always use your most recent med list.               amoxicillin-clavulanate 875-125 MG per tablet  Commonly known as:  AUGMENTIN  Take 1 tablet by mouth 2 (two) times daily.     fluticasone 50 MCG/ACT nasal spray  Commonly known as:  FLONASE  Place 2 sprays into  both nostrils daily.     meloxicam 15 MG tablet  Commonly known as:  MOBIC  TAKE 1 TABLET (15 MG TOTAL) BY MOUTH DAILY.     pregabalin 150 MG capsule  Commonly known as:  LYRICA  Take 1 capsule (150 mg total) by mouth 3 (three) times daily.     sildenafil 100 MG tablet  Commonly known as:  VIAGRA  Take 1 tablet (100 mg total) by mouth as needed for erectile dysfunction.     tiZANidine 4 MG tablet  Commonly known as:  ZANAFLEX  TAKE 1 TABLET AT BEDTIME AS NEEDED     traMADol 50 MG tablet  Commonly known as:  ULTRAM  TAKE 1 TABLET (50 MG TOTAL) BY MOUTH EVERY 6 (SIX) HOURS AS NEEDED FOR PAIN.     VITAMIN D PO  Take 5,000 mg by mouth daily.     zolpidem 10 MG tablet  Commonly known as:   AMBIEN  TAKE 1/2 TO 1 TABLET BY MOUTH 30 MINUTES BEFORE BEDTIME

## 2013-01-18 NOTE — Progress Notes (Signed)
Pre-visit discussion using our clinic review tool. No additional management support is needed unless otherwise documented below in the visit note.  

## 2013-02-18 ENCOUNTER — Other Ambulatory Visit: Payer: Self-pay | Admitting: Family Medicine

## 2013-02-19 NOTE — Telephone Encounter (Signed)
Ok to refill 

## 2013-02-21 ENCOUNTER — Encounter (HOSPITAL_COMMUNITY): Payer: Self-pay | Admitting: Emergency Medicine

## 2013-02-21 ENCOUNTER — Emergency Department (HOSPITAL_COMMUNITY)
Admission: EM | Admit: 2013-02-21 | Discharge: 2013-02-21 | Disposition: A | Discharge status: Home / Self Care | Payer: BC Managed Care – PPO | Source: Home / Self Care | Attending: Family Medicine | Admitting: Family Medicine

## 2013-02-21 DIAGNOSIS — J329 Chronic sinusitis, unspecified: Secondary | ICD-10-CM

## 2013-02-21 MED ORDER — IPRATROPIUM BROMIDE 0.06 % NA SOLN: 2.0000 | Freq: Four times a day (QID) | NASAL | Status: DC

## 2013-02-21 MED ORDER — AMOXICILLIN-POT CLAVULANATE 875-125 MG PO TABS: 1.0000 | ORAL_TABLET | Freq: Two times a day (BID) | ORAL | Status: DC

## 2013-02-21 NOTE — Discharge Instructions (Signed)
Thank you for coming in today. Take Augmentin twice daily. Use Atrovent nasal spray as needed. Followup with your Dr. if not getting better. Use up to 2 Aleve twice daily for pain. Call or go to the emergency room if you get worse, have trouble breathing, have chest pains, or palpitations. '  Sinusitis Sinusitis is redness, soreness, and swelling (inflammation) of the paranasal sinuses. Paranasal sinuses are air pockets within the bones of your face (beneath the eyes, the middle of the forehead, or above the eyes). In healthy paranasal sinuses, mucus is able to drain out, and air is able to circulate through them by way of your nose. However, when your paranasal sinuses are inflamed, mucus and air can become trapped. This can allow bacteria and other germs to grow and cause infection. Sinusitis can develop quickly and last only a short time (acute) or continue over a long period (chronic). Sinusitis that lasts for more than 12 weeks is considered chronic.  CAUSES  Causes of sinusitis include:  Allergies.  Structural abnormalities, such as displacement of the cartilage that separates your nostrils (deviated septum), which can decrease the air flow through your nose and sinuses and affect sinus drainage.  Functional abnormalities, such as when the small hairs (cilia) that line your sinuses and help remove mucus do not work properly or are not present. SYMPTOMS  Symptoms of acute and chronic sinusitis are the same. The primary symptoms are pain and pressure around the affected sinuses. Other symptoms include:  Upper toothache.  Earache.  Headache.  Bad breath.  Decreased sense of smell and taste.  A cough, which worsens when you are lying flat.  Fatigue.  Fever.  Thick drainage from your nose, which often is green and may contain pus (purulent).  Swelling and warmth over the affected sinuses. DIAGNOSIS  Your caregiver will perform a physical exam. During the exam, your caregiver  may:  Look in your nose for signs of abnormal growths in your nostrils (nasal polyps).  Tap over the affected sinus to check for signs of infection.  View the inside of your sinuses (endoscopy) with a special imaging device with a light attached (endoscope), which is inserted into your sinuses. If your caregiver suspects that you have chronic sinusitis, one or more of the following tests may be recommended:  Allergy tests.  Nasal culture A sample of mucus is taken from your nose and sent to a lab and screened for bacteria.  Nasal cytology A sample of mucus is taken from your nose and examined by your caregiver to determine if your sinusitis is related to an allergy. TREATMENT  Most cases of acute sinusitis are related to a viral infection and will resolve on their own within 10 days. Sometimes medicines are prescribed to help relieve symptoms (pain medicine, decongestants, nasal steroid sprays, or saline sprays).  However, for sinusitis related to a bacterial infection, your caregiver will prescribe antibiotic medicines. These are medicines that will help kill the bacteria causing the infection.  Rarely, sinusitis is caused by a fungal infection. In theses cases, your caregiver will prescribe antifungal medicine. For some cases of chronic sinusitis, surgery is needed. Generally, these are cases in which sinusitis recurs more than 3 times per year, despite other treatments. HOME CARE INSTRUCTIONS   Drink plenty of water. Water helps thin the mucus so your sinuses can drain more easily.  Use a humidifier.  Inhale steam 3 to 4 times a day (for example, sit in the bathroom with the shower  running).  Apply a warm, moist washcloth to your face 3 to 4 times a day, or as directed by your caregiver.  Use saline nasal sprays to help moisten and clean your sinuses.  Take over-the-counter or prescription medicines for pain, discomfort, or fever only as directed by your caregiver. SEEK IMMEDIATE  MEDICAL CARE IF:  You have increasing pain or severe headaches.  You have nausea, vomiting, or drowsiness.  You have swelling around your face.  You have vision problems.  You have a stiff neck.  You have difficulty breathing. MAKE SURE YOU:   Understand these instructions.  Will watch your condition.  Will get help right away if you are not doing well or get worse. Document Released: 02/04/2005 Document Revised: 04/29/2011 Document Reviewed: 02/19/2011 [facility] Patient Information 2014 [place], [place].

## 2013-02-21 NOTE — ED Notes (Signed)
C/O runny nose, nasal congestion, sinus pain, slight chest congestion, left earache sinc 12/29.  Denies n/v/d.  Has been taking Alka Seltzer and Advil Cold & Sinus without relief.

## 2013-02-21 NOTE — ED Provider Notes (Signed)
Raymond [name] is a 38 y.o. male who presents to Urgent Care today for 6 days of cough congestion runny nose sore throat and facial pressure. Patient has tried multiple over-the-counter medications which have not been very effective. He denies any significant shortness of breath nausea vomiting or diarrhea. He feels well otherwise.   Past Medical History  Diagnosis Date  . Migraine   . Fibromyalgia   . Heart palpitations    History  Substance Use Topics  . Smoking status: Current Every Day Smoker -- 1.00 packs/day    Types: Cigarettes  . Smokeless tobacco: Never Used  . Alcohol Use: No   ROS as above Medications reviewed. No current facility-administered medications for this encounter.   Current Outpatient Prescriptions  Medication Sig Dispense Refill  . meloxicam (MOBIC) 15 MG tablet TAKE 1 TABLET (15 MG TOTAL) BY MOUTH DAILY.  30 tablet  3  . pregabalin (LYRICA) 150 MG capsule Take 1 capsule (150 mg total) by mouth 3 (three) times daily.  90 capsule  5  . zolpidem (AMBIEN) 10 MG tablet TAKE 1/2 TO 1 TABLET BY MOUTH 30 MINUTES BEFORE BEDTIME  30 tablet  3  . amoxicillin-clavulanate (AUGMENTIN) 875-125 MG per tablet Take 1 tablet by mouth every 12 (twelve) hours.  14 tablet  0  . Cholecalciferol (VITAMIN D PO) Take 5,000 mg by mouth daily.      Marland Kitchen ipratropium (ATROVENT) 0.06 % nasal spray Place 2 sprays into both nostrils 4 (four) times daily.  15 mL  1  . sildenafil (VIAGRA) 100 MG tablet Take 1 tablet (100 mg total) by mouth as needed for erectile dysfunction.  10 tablet  11  . tiZANidine (ZANAFLEX) 4 MG tablet TAKE 1 TABLET AT BEDTIME AS NEEDED  30 tablet  5  . traMADol (ULTRAM) 50 MG tablet TAKE 1 TABLET (50 MG TOTAL) BY MOUTH EVERY 6 (SIX) HOURS AS NEEDED FOR PAIN.  50 tablet  5  . [DISCONTINUED] fluticasone (FLONASE) 50 MCG/ACT nasal spray Place 2 sprays into both nostrils daily.  16 g  12    Exam:  BP 126/87  Pulse 73  Temp(Src) 98 F (36.7 C) (Oral)  Resp 18  SpO2  98% Gen: Well NAD HEENT: EOMI,  MMM tender palpation left maxillary sinus. Posterior pharynx with cobblestoning. Tympanic membranes are normal appearing bilaterally. Lungs: Normal work of breathing. CTABL Heart: RRR no MRG Abd: NABS, Soft. NT, ND Exts: Non edematous BL  LE, warm and well perfused.    Assessment and Plan: 38 y.o. male with sinusitis. Plan to treat with Augmentin and Flonase nasal spray. Will use high-dose NSAIDs for pain control as needed. Followup with primary care provider for improving. Discussed warning signs or symptoms. Please see discharge instructions. Patient expresses understanding.      Gregor Hams, MD 02/21/13 [phone]

## 2013-02-22 ENCOUNTER — Telehealth: Payer: Self-pay | Admitting: Family Medicine

## 2013-02-22 ENCOUNTER — Other Ambulatory Visit: Payer: Self-pay | Admitting: Family Medicine

## 2013-02-22 NOTE — Telephone Encounter (Signed)
Confidential Office Message [address] [place], [place] [address] p. [phone] f. [phone] To: Virgel Manifold (After Hours Triage) Fax: [phone] From: [facility] Date/ Time: 02/20/2013 9:42 PM Taken By: Noemi Chapel, RN Caller: [place]: Not Collected Patient: Raymond [name], Raymond [name] DOB: 1975/10/15 Phone: [phone] Reason for Call: Caller was unable to be reached on callback - Left Message Regarding Appointment: No Appt Date: Appt Time: Unknown Provider: Reason: Details: Outcome: Confidential

## 2013-03-18 ENCOUNTER — Ambulatory Visit (INDEPENDENT_AMBULATORY_CARE_PROVIDER_SITE_OTHER): Payer: BC Managed Care – PPO | Admitting: Family Medicine

## 2013-03-18 ENCOUNTER — Encounter: Payer: Self-pay | Admitting: Family Medicine

## 2013-03-18 VITALS — BP 134/100 | HR 72 | Temp 98.3°F | Ht 68.0 in | Wt 180.5 lb

## 2013-03-18 DIAGNOSIS — G4733 Obstructive sleep apnea (adult) (pediatric): Secondary | ICD-10-CM

## 2013-03-18 DIAGNOSIS — R030 Elevated blood-pressure reading, without diagnosis of hypertension: Secondary | ICD-10-CM | POA: Insufficient documentation

## 2013-03-18 DIAGNOSIS — H6980 Other specified disorders of Eustachian tube, unspecified ear: Secondary | ICD-10-CM | POA: Insufficient documentation

## 2013-03-18 DIAGNOSIS — J309 Allergic rhinitis, unspecified: Secondary | ICD-10-CM

## 2013-03-18 DIAGNOSIS — G478 Other sleep disorders: Secondary | ICD-10-CM | POA: Insufficient documentation

## 2013-03-18 MED ORDER — MONTELUKAST SODIUM 10 MG PO TABS: 10.0000 mg | ORAL_TABLET | Freq: Every day | ORAL | Status: DC

## 2013-03-18 MED ORDER — PREDNISONE 10 MG PO TABS: ORAL_TABLET | ORAL | Status: DC

## 2013-03-18 NOTE — Progress Notes (Signed)
   Subjective:    Patient ID: Raymond [name], male    DOB: 28-Jan-1976, 38 y.o.   MRN: [id number]  HPI 38 year old male pt with fibromyalgia, migrane of Dr. Lillie Fragmin presents with new onset dizziness and sore throat in last 3 weeks. Some cough, congestion won't go away. Treated for sinus infection by urgent care 02/21/18-14... Given Augmentin and flonase. Sinus pressure resolved. Then had new onset dizziness describes as lightheaded.  No syncope. He has also noted pressure on windpipe since he has been sick. occ shortness of breath.  No chest pain with exertion.  Last night wife noted him stop breathing for a few seconds. He always snores. Has headache off and on.  BP Readings from Last 3 Encounters:  03/18/13 134/100  02/21/13 126/87  01/18/13 140/100    OTC meds: no decongestants  Mucinex has not helped in past.  Family history of long Qt syndrome.   Review of Systems  Constitutional: Positive for fatigue. Negative for fever.  HENT: Positive for rhinorrhea and sneezing. Negative for ear pain and sinus pressure.   Eyes: Positive for itching. Negative for pain.  Respiratory: Negative for shortness of breath and wheezing.   Cardiovascular: Negative for chest pain and leg swelling.  Gastrointestinal: Negative for abdominal pain.  Musculoskeletal: Positive for back pain, myalgias, neck pain and neck stiffness.       Objective:   Physical Exam  Constitutional: Vital signs are normal. He appears well-developed and well-nourished.  fatigued appearing in NAD  HENT:  Head: Normocephalic.  Right Ear: Hearing normal. Tympanic membrane is not erythematous. A middle ear effusion is present.  Left Ear: Hearing normal. Tympanic membrane is not erythematous. A middle ear effusion is present.  Nose: Mucosal edema and rhinorrhea present. Right sinus exhibits no maxillary sinus tenderness and no frontal sinus tenderness. Left sinus exhibits no maxillary sinus tenderness and no frontal sinus  tenderness.  Mouth/Throat: Oropharynx is clear and moist and mucous membranes are normal. No oropharyngeal exudate, posterior oropharyngeal edema or posterior oropharyngeal erythema.  Eyes: Right conjunctiva is injected. Left conjunctiva is injected.  Neck: Trachea normal. Carotid bruit is not present. No mass and no thyromegaly present.  Cardiovascular: Normal rate, regular rhythm and normal pulses.  Exam reveals no gallop, no distant heart sounds and no friction rub.   No murmur heard. No peripheral edema  Pulmonary/Chest: Effort normal and breath sounds normal. No respiratory distress.  Skin: Skin is warm, dry and intact. No rash noted.  Psychiatric: He has a normal mood and affect. His speech is normal and behavior is normal. Thought content normal.          Assessment & Plan:

## 2013-03-18 NOTE — Progress Notes (Signed)
Pre-visit discussion using our clinic review tool. No additional management support is needed unless otherwise documented below in the visit note.  

## 2013-03-18 NOTE — Assessment & Plan Note (Signed)
Follow at home on cuff, record and bring measurements in to follow up in 2 weeks.

## 2013-03-18 NOTE — Patient Instructions (Addendum)
Start singulair.  Continue flonase and zyrtec.  Prednisone taper to open eustacian tubes.  Follow BP at home, record, bring to next appt. Stop at front desk to schedule sleep study. Follow up with PCP in 2 weeks.  Go to ER if severe shortness of breath, chest pain.

## 2013-03-18 NOTE — Assessment & Plan Note (Signed)
Add singulair to regimen of flonase and zyrtec.

## 2013-03-18 NOTE — Assessment & Plan Note (Signed)
Nasal steroid not helping. Cannot take decongestant due to ? BP increased  Will try trial of prednsione.

## 2013-03-19 ENCOUNTER — Telehealth: Payer: Self-pay | Admitting: Family Medicine

## 2013-03-19 NOTE — Telephone Encounter (Signed)
Relevant patient education assigned to patient using Emmi. ° °

## 2013-03-22 ENCOUNTER — Telehealth: Payer: Self-pay | Admitting: Family Medicine

## 2013-03-22 NOTE — Telephone Encounter (Signed)
Pt's wife is calling for her husband and wants to know if he could be referred to [facility] sleep study because pt wants ot go to work after appointment. Please advise.

## 2013-04-05 ENCOUNTER — Encounter: Payer: Self-pay | Admitting: Family Medicine

## 2013-04-05 ENCOUNTER — Ambulatory Visit (INDEPENDENT_AMBULATORY_CARE_PROVIDER_SITE_OTHER): Payer: BC Managed Care – PPO | Admitting: Family Medicine

## 2013-04-05 VITALS — BP 136/90 | HR 84 | Temp 98.2°F | Ht 68.0 in | Wt 183.5 lb

## 2013-04-05 DIAGNOSIS — I10 Essential (primary) hypertension: Secondary | ICD-10-CM

## 2013-04-05 MED ORDER — HYDROCHLOROTHIAZIDE 12.5 MG PO TABS: 12.5000 mg | ORAL_TABLET | Freq: Every day | ORAL | Status: DC

## 2013-04-05 NOTE — Progress Notes (Signed)
Pre-visit discussion using our clinic review tool. No additional management support is needed unless otherwise documented below in the visit note.  

## 2013-04-05 NOTE — Progress Notes (Signed)
Date:  04/05/2013   Name:  Raymond [name]   DOB:  1975/04/13   MRN:  [id number] Gender: male Age: 38 y.o.  Primary Physician:  [name], MD   Chief Complaint: Follow-up   Subjective:   History of Present Illness:  Raymond [name] is a 38 y.o. pleasant patient who presents with the following:  New onset HTN, seen by my partner 2 weeks ago with acute illness, also noted continued elevated BP. His wife is in CNA school, so she has been checking his BP and it has been persistently elevated over 140/90. He has had weight gain with Lyrica, but it has been the only thing that has helped with his pain -- he has had extensive work-up by mutiple physicians of different specialties.   Patient Active Problem List   Diagnosis Date Noted  . Hypertension 04/06/2013  . Elevated blood-pressure reading without diagnosis of hypertension 03/18/2013  . Obstructive sleep apnea 03/18/2013  . Allergic rhinitis 01/18/2013  . Family history of long QT syndrome 06/12/2012  . 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 Smoker 06/12/2012  . Common migraine 05/26/2012  . Neck pain 05/26/2012  . Back pain 05/26/2012  . Fibromyalgia 05/26/2012    Past Medical History  Diagnosis Date  . Migraine   . Fibromyalgia   . Heart palpitations   . Hypertension 04/06/2013    Past Surgical History  Procedure Laterality Date  . Cholecystectomy      History   Social History  . Marital Status: Married    Spouse Name: N/A    Number of Children: N/A  . Years of Education: N/A   Occupational History  . Not on file.   Social History Main Topics  . Smoking status: Current Every Day Smoker -- 1.00 packs/day    Types: Cigarettes  . Smokeless tobacco: Never Used  . Alcohol Use: No  . Drug Use: No  . Sexual Activity: Not on file   Other Topics Concern  . Not on file   Social History Narrative   Mom was in hospital with long QT syndrome, then heart in A [facility].   Sister has long QT syndrome   1st cousin:  long QT syndrome          No family history on file.  Allergies  Allergen Reactions  . Azithromycin   . Percocet [Oxycodone-Acetaminophen] Hives  . Vicodin [Hydrocodone-Acetaminophen] Hives    Medication list has been reviewed and updated.  Review of Systems:   GEN: No acute illnesses, no fevers, chills. GI: No n/v/d, eating normally Pulm: No SOB Interactive and getting along well at home.  Otherwise, ROS is as per the HPI.  Objective:   Physical Examination: BP 136/90  Pulse 84  Temp(Src) 98.2 F (36.8 C) (Oral)  Ht 5\' 8"  (1.727 m)  Wt 183 lb 8 oz (83.235 kg)  BMI 27.91 kg/m2  Ideal Body Weight: Weight in (lb) to have BMI = 25: 164.1   GEN: WDWN, NAD, Non-toxic, A & O x 3 HEENT: Atraumatic, Normocephalic. Neck supple. No masses, No LAD. Ears and Nose: No external deformity. CV: RRR, No M/G/R. No JVD. No thrill. No extra heart sounds. PULM: CTA B, no wheezes, crackles, rhonchi. No retractions. No resp. distress. No accessory muscle use. EXTR: No c/c/e NEURO Normal gait.  PSYCH: Normally interactive. Conversant. Not depressed or anxious appearing.  Calm demeanor.   Laboratory and Imaging Data:  Assessment & Plan:    Hypertension  New onset. Start with HCTZ. Titrate up  if needed.  Patient Instructions  Check blood pressure twice a week, and if > 130/80 after a few weeks, call me and I will adjust meds over the phone.   If less than 130/80, everything is fine.    No orders of the defined types were placed in this encounter.    New medications, updates to list, dose adjustments: Meds ordered this encounter  Medications  . hydrochlorothiazide (HYDRODIURIL) 12.5 MG tablet    Sig: Take 1 tablet (12.5 mg total) by mouth daily.    Dispense:  30 tablet    Refill:  5    Signed,  Sueo Cullen T. Skye Plamondon, MD, [place] at [facility] [place] [address] Phone: [phone] Fax: [phone]      Medication List       This list is accurate as of: 04/05/13 11:59 PM.  Always use your most recent med list.               hydrochlorothiazide 12.5 MG tablet  Commonly known as:  HYDRODIURIL  Take 1 tablet (12.5 mg total) by mouth daily.     ipratropium 0.06 % nasal spray  Commonly known as:  ATROVENT  Place 2 sprays into both nostrils 4 (four) times daily.     meloxicam 15 MG tablet  Commonly known as:  MOBIC  TAKE 1 TABLET (15 MG TOTAL) BY MOUTH DAILY.     montelukast 10 MG tablet  Commonly known as:  SINGULAIR  Take 1 tablet (10 mg total) by mouth at bedtime.     pregabalin 150 MG capsule  Commonly known as:  LYRICA  Take 1 capsule (150 mg total) by mouth 3 (three) times daily.     sildenafil 100 MG tablet  Commonly known as:  VIAGRA  Take 1 tablet (100 mg total) by mouth as needed for erectile dysfunction.     tiZANidine 4 MG tablet  Commonly known as:  ZANAFLEX  TAKE 1 TABLET AT BEDTIME AS NEEDED     traMADol 50 MG tablet  Commonly known as:  ULTRAM  TAKE 1 TABLET (50 MG TOTAL) BY MOUTH EVERY 6 (SIX) HOURS AS NEEDED FOR PAIN.     VITAMIN D PO  Take 5,000 mg by mouth daily.     zolpidem 10 MG tablet  Commonly known as:  AMBIEN  TAKE 1/2 TO 1 TABLET BY MOUTH 30 MINUTES BEFORE BEDTIME

## 2013-04-05 NOTE — Patient Instructions (Signed)
Check blood pressure twice a week, and if > 130/80 after a few weeks, call me and I will adjust meds over the phone.   If less than 130/80, everything is fine.

## 2013-04-06 ENCOUNTER — Encounter: Payer: Self-pay | Admitting: Family Medicine

## 2013-04-06 DIAGNOSIS — I10 Essential (primary) hypertension: Secondary | ICD-10-CM | POA: Insufficient documentation

## 2013-04-07 ENCOUNTER — Telehealth: Payer: Self-pay | Admitting: Family Medicine

## 2013-04-07 NOTE — Telephone Encounter (Signed)
Relevant patient education assigned to patient using Emmi. ° °

## 2013-04-14 ENCOUNTER — Institutional Professional Consult (permissible substitution): Payer: BC Managed Care – PPO | Admitting: Pulmonary Disease

## 2013-04-29 ENCOUNTER — Other Ambulatory Visit (HOSPITAL_COMMUNITY): Payer: Self-pay | Admitting: Family Medicine

## 2013-05-10 ENCOUNTER — Encounter: Payer: Self-pay | Admitting: Pulmonary Disease

## 2013-05-10 ENCOUNTER — Ambulatory Visit (INDEPENDENT_AMBULATORY_CARE_PROVIDER_SITE_OTHER): Payer: BC Managed Care – PPO | Admitting: Pulmonary Disease

## 2013-05-10 VITALS — BP 144/96 | HR 81 | Temp 98.2°F | Ht 68.0 in | Wt 182.8 lb

## 2013-05-10 DIAGNOSIS — G4733 Obstructive sleep apnea (adult) (pediatric): Secondary | ICD-10-CM

## 2013-05-10 NOTE — Assessment & Plan Note (Signed)
He has snoring, sleep disruption, witnessed apnea, and daytime sleepiness.  He has history of hypertension and fibromyalgia.  I am concerned he could have sleep apnea.  We discussed how sleep apnea can affect various health problems including risks for hypertension, cardiovascular disease, and diabetes.  We also discussed how sleep disruption can increase risks for accident, such as while driving.  Weight loss as a means of improving sleep apnea was also reviewed.  Additional treatment options discussed were CPAP therapy, oral appliance, and surgical intervention.  To further assess will arrange for in lab sleep study.

## 2013-05-10 NOTE — Patient Instructions (Signed)
Will arrange for sleep study Will call to arrange for follow up after sleep study reviewed 

## 2013-05-10 NOTE — Progress Notes (Deleted)
   Subjective:    Patient ID: Raymond [name], male    DOB: Apr 12, 1975, 38 y.o.   MRN: [id number]  HPI    Review of Systems  Constitutional: Positive for unexpected weight change. Negative for fever.  HENT: Positive for congestion, ear pain and sinus pressure. Negative for dental problem, nosebleeds, postnasal drip, rhinorrhea, sneezing, sore throat and trouble swallowing.   Eyes: Positive for redness and itching.  Respiratory: Positive for chest tightness. Negative for cough, shortness of breath and wheezing.   Cardiovascular: Positive for palpitations. Negative for leg swelling.  Gastrointestinal: Negative for nausea and vomiting.  Genitourinary: Negative for dysuria.  Musculoskeletal: Positive for joint swelling.  Skin: Negative for rash.  Neurological: Negative for headaches.  Hematological: Does not bruise/bleed easily.  Psychiatric/Behavioral: Negative for dysphoric mood. The patient is not nervous/anxious.        Objective:   Physical Exam        Assessment & Plan:

## 2013-05-10 NOTE — Progress Notes (Signed)
Chief Complaint  Patient presents with  . Sleep Consult    Referred by Dr. Diona Browner for sleep apnea. Epworth= 10    History of Present Illness: Raymond [name] is a 38 y.o. male for evaluation of sleep problems.  His wife has been worried about his breathing while asleep.  He is a loud snorer, and will stop breathing while asleep.  He wakes up feeling choked.  His mouth gets dry at night also.  He goes to sleep at 11 pm.  He falls asleep after watching TV for 1 to 2 hours.  He wakes up 1 or 2 times to use the bathroom.  He gets out of bed at 630 am.  He feels tire in the morning.  He gets occasional morning headache.  He will sometimes use ambien to help sleep.  He takes lyrica and zanaflex at night for fibromyalgia and back spasms.  He sometimes drinks red bull >> caffeine doesn't help keep him awake.  He will fall asleep when watching TV or reading.  He will sometimes talk in his sleep and gets leg cramps at night.  He has gained 25 lbs over the past year.  He denies sleep walking, sleep talking, bruxism, or nightmares.  There is no history of restless legs.  He denies sleep hallucinations, sleep paralysis, or cataplexy.  The Epworth score is 10 out of 24.  Raymond [name]  has a past medical history of Migraine; Fibromyalgia; Heart palpitations; and Hypertension (04/06/2013).  Raymond [name]  has past surgical history that includes Cholecystectomy.  Prior to Admission medications   Medication Sig Start Date End Date Taking? Authorizing Provider  Cholecalciferol (VITAMIN D PO) Take 5,000 mg by mouth daily.   Yes Historical Provider, MD  hydrochlorothiazide (HYDRODIURIL) 12.5 MG tablet Take 1 tablet (12.5 mg total) by mouth daily. 04/05/13  Yes [name], MD  ipratropium (ATROVENT) 0.06 % nasal spray PLACE 2 SPRAYS INTO BOTH NOSTRILS 4 (FOUR) TIMES DAILY. 04/29/13  Yes [name], MD  meloxicam (MOBIC) 15 MG tablet TAKE 1 TABLET (15 MG TOTAL) BY MOUTH DAILY. 02/18/13  Yes [name],  MD  montelukast (SINGULAIR) 10 MG tablet Take 1 tablet (10 mg total) by mouth at bedtime. 03/18/13  Yes Amy Cletis Athens, MD  pregabalin (LYRICA) 150 MG capsule Take 1 capsule (150 mg total) by mouth 3 (three) times daily. 01/18/13  Yes [name], MD  sildenafil (VIAGRA) 100 MG tablet Take 1 tablet (100 mg total) by mouth as needed for erectile dysfunction. 09/21/12  Yes [name], MD  tiZANidine (ZANAFLEX) 4 MG tablet TAKE 1 TABLET AT BEDTIME AS NEEDED 02/18/13  Yes [name], MD  traMADol (ULTRAM) 50 MG tablet TAKE 1 TABLET (50 MG TOTAL) BY MOUTH EVERY 6 (SIX) HOURS AS NEEDED FOR PAIN. 11/16/12  Yes [name], MD  zolpidem (AMBIEN) 10 MG tablet TAKE 1/2 TO 1 TABLET BY MOUTH 30 MINUTES BEFORE BEDTIME 11/20/12  Yes [name], MD    Allergies  Allergen Reactions  . Azithromycin   . Percocet [Oxycodone-Acetaminophen] Hives  . Vicodin [Hydrocodone-Acetaminophen] Hives    His family history includes Diabetes in his father and mother; Heart disease in his mother; Lung disease in his father.  He  reports that he has been smoking Cigarettes.  He has been smoking about 1.00 pack per day. He has never used smokeless tobacco. He reports that he does not drink alcohol or use illicit drugs.  Review of Systems  Constitutional: Positive for unexpected weight change. Negative  for fever.  HENT: Positive for congestion, ear pain and sinus pressure. Negative for dental problem, nosebleeds, postnasal drip, rhinorrhea, sneezing, sore throat and trouble swallowing.   Eyes: Positive for redness and itching.  Respiratory: Positive for chest tightness. Negative for cough, shortness of breath and wheezing.   Cardiovascular: Positive for palpitations. Negative for leg swelling.  Gastrointestinal: Negative for nausea and vomiting.  Genitourinary: Negative for dysuria.  Musculoskeletal: Positive for joint swelling.  Skin: Negative for rash.  Neurological: Negative for headaches.  Hematological:  Does not bruise/bleed easily.  Psychiatric/Behavioral: Negative for dysphoric mood. The patient is not nervous/anxious.    Physical Exam:  General - No distress ENT - No sinus tenderness, no oral exudate, no LAN, no thyromegaly, TM clear, pupils equal/reactive, high arched palate, 2+ tonsils, enlarged tongue Cardiac - s1s2 regular, no murmur, pulses symmetric Chest - No wheeze/rales/dullness, good air entry, normal respiratory excursion Back - No focal tenderness Abd - Soft, non-tender, no organomegaly, + bowel sounds Ext - No edema Neuro - Normal strength, cranial nerves intact Skin - No rashes Psych - Normal mood, and behavior  Assessment/plan:  Chesley Mires, M.D. Pager [phone]

## 2013-05-11 ENCOUNTER — Telehealth: Payer: Self-pay

## 2013-05-11 NOTE — Telephone Encounter (Signed)
Left message for Raymond [name] to return my call.

## 2013-05-11 NOTE — Telephone Encounter (Signed)
Please call:  The patient doesn't take flexeril anymore. He cannot take this and zanaflex at the same time.   Dr. Llana Aliment thinks he likely has sleep apnea. I would not change any HTN meds until after sleep apnea is treated. If OSA is treated, very likely his BP will go down.  After gets CPAP or appliance for OSA, check BP once a week for a few weeks, then f/u with me.

## 2013-05-11 NOTE — Telephone Encounter (Signed)
Raymond [name] left v/m; pt was seen at Dr Cephus Slater office on 05/10/13 and BP was 144/96. Also request new prescription for flexeril for pain medication [facility].Please advise.

## 2013-05-11 NOTE — Telephone Encounter (Signed)
[name] (wife) notified as instructed by telephone. 

## 2013-05-13 ENCOUNTER — Other Ambulatory Visit: Payer: Self-pay | Admitting: Family Medicine

## 2013-05-14 NOTE — Telephone Encounter (Signed)
Last office visit 04/05/2013.  Ok to refill?

## 2013-05-20 ENCOUNTER — Telehealth: Payer: Self-pay | Admitting: Family Medicine

## 2013-05-20 NOTE — Telephone Encounter (Signed)
Patient Information:  Caller Name: Kenney Houseman  Phone: [phone]  Patient: Raymond [name], Raymond [name]  Gender: Male  DOB: 1975-06-10  Age: 38 Years  PCP: [name] (Family Practice)  Office Follow Up:  Does the office need to follow up with this patient?: No  Instructions For The Office: N/A  RN Note:  Instructions given for the pt. to try Cetaphil Cleanser and repeat the process twice in the next 2 weeks at one week and 2 weeks. Care Advice per Guidelines.  Symptoms  Reason For Call & Symptoms: Has head lice. Used Rid OTC and still seeing evidence of them. Has washed everything in hot water. Other things have been bagged.  Reviewed Health History In EMR: Yes  Reviewed Medications In EMR: Yes  Reviewed Allergies In EMR: Yes  Reviewed Surgeries / Procedures: Yes  Date of Onset of Symptoms: 05/15/2013  Treatments Tried: RID  Treatments Tried Worked: No  Guideline(s) Used:  Lice  Disposition Per Guideline:   Home Care  Reason For Disposition Reached:   Head lice  Advice Given:  Reassurance:  Head lice can be treated at home.  With careful treatment, all lice and nits (lice eggs) are usually killed.  There are no lasting problems from having head lice.  They do not carry any diseases.  They do not make your child feel sick.  Cleaning the House:   Lice that are off the body rarely cause reinfection. (Reason: lice can't live for over 24 hours off the human body.) Just vacuum your child's room.  Soak hair brushes for 1 hour in a solution containing some anti-lice shampoo.  Wash your child's sheets, blankets, pillow cases, and any clothes worn in the past 2 days in hot water (517 F kills lice and nits).  Optional step (probably not necessary): Items that can't be washed (e.g., hats or scarves) should be set aside in sealed plastic bags for 2 weeks (the longest period that nits can survive).  Call Back If:  New lice or nits appear in the hair  Extra Advice - Cetaphil Cleanser for Nix  Treatment Failures:  Go to your drugstore and buy Cetaphil cleanser (OTC) in the soap department.  It works by coating the lice and suffocating them.  Apply the Cetaphil cleanser throughout the scalp to dry hair.  After all the hair is wet, wait 2 minutes for Cetaphil to soak in.  Comb out as much excess cleanser as possible.  Blow dry your child's hair. It has to be thoroughly dry down to the scalp to suffocate the lice. Expect this to take 3 times longer than it would if the hair was just wet with water.  The dried Cetaphil will smother the lice. Leave it on your child's hair for at least 8 hours.  In the morning, wash off the Cetaphil with a regular shampoo.  To cure your child of lice, REPEAT this process twice in 1 and 2 weeks.  The cure rate can be 97%. Rip Harbour 2004)  Detailed instructions are available online: www.nuvoforheadlice.com  Patient Will Follow Care Advice:  YES

## 2013-06-03 ENCOUNTER — Telehealth: Payer: Self-pay | Admitting: Family Medicine

## 2013-06-03 MED ORDER — MALATHION 0.5 % EX LOTN: TOPICAL_LOTION | Freq: Once | CUTANEOUS | Status: DC

## 2013-06-03 NOTE — Telephone Encounter (Signed)
Pt's wife called and would like a stronger prescription to kill lice, her husband is continuing to have problems with lice.  She mentioned the prescription OVIDE or the generic brand.  Would also like to have 2 refills in case they are needed.  [facility] [address]  Best number to call Wife Raymond [name] is [phone], select "0" . /lt

## 2013-06-03 NOTE — Telephone Encounter (Signed)
Raymond [name] (wife) notified prescription has been sent to [facility] [address]   Durward Fortes to have [name] do a treatment and then repeat it in one week.

## 2013-06-03 NOTE — Telephone Encounter (Signed)
Sent in. i would do a treatment and then repeat it in 1 week.

## 2013-06-13 ENCOUNTER — Ambulatory Visit (HOSPITAL_BASED_OUTPATIENT_CLINIC_OR_DEPARTMENT_OTHER): Payer: BC Managed Care – PPO | Attending: Pulmonary Disease | Admitting: Radiology

## 2013-06-13 VITALS — Ht 69.0 in | Wt 182.0 lb

## 2013-06-13 DIAGNOSIS — G4733 Obstructive sleep apnea (adult) (pediatric): Secondary | ICD-10-CM

## 2013-06-17 ENCOUNTER — Telehealth: Payer: Self-pay | Admitting: Pulmonary Disease

## 2013-06-17 DIAGNOSIS — G4733 Obstructive sleep apnea (adult) (pediatric): Secondary | ICD-10-CM

## 2013-06-17 NOTE — Telephone Encounter (Signed)
PSG 06/13/13 >> AHI 1, SaO2 low 83%, UARS.  Will have my nurse schedule ROV to review sleep study results.

## 2013-06-17 NOTE — Sleep Study (Signed)
[place]  NAME: Kaston Faughn DATE OF BIRTH:  05-08-1975 MEDICAL RECORD NUMBER [id number]  LOCATION: [facility]  PHYSICIAN: Chesley Mires, M.D. DATE OF STUDY: 06/13/2013  SLEEP STUDY TYPE: Polysomnogram               REFERRING PHYSICIAN: Chesley Mires, MD  INDICATION FOR STUDY:  Raymond [name] is a 38 y.o. male who presents to the sleep lab for evaluation of hypersomnia with obstructive sleep apnea.  He reports snoring, sleep disruption, apnea, and daytime sleepiness.  He has a history of hypertension and fibromyalgia.  EPWORTH SLEEPINESS SCORE: 12. HEIGHT: 5\' 9"  (175.3 cm)  WEIGHT: 182 lb (82.555 kg)    Body mass index is 26.86 kg/(m^2).  NECK SIZE: 15 in.  MEDICATIONS:  Current Outpatient Prescriptions on File Prior to Visit  Medication Sig Dispense Refill  . Cholecalciferol (VITAMIN D PO) Take 5,000 mg by mouth daily.      . hydrochlorothiazide (HYDRODIURIL) 12.5 MG tablet Take 1 tablet (12.5 mg total) by mouth daily.  30 tablet  5  . ipratropium (ATROVENT) 0.06 % nasal spray PLACE 2 SPRAYS INTO BOTH NOSTRILS 4 (FOUR) TIMES DAILY.  15 mL  5  . malathion (OVIDE) 0.5 % lotion Apply topically once. Sprinkle lotion on dry hair and rub gently until the scalp is thoroughly moistened. Allow to dry naturally and leave uncovered.  59 mL  2  . meloxicam (MOBIC) 15 MG tablet TAKE 1 TABLET (15 MG TOTAL) BY MOUTH DAILY.  30 tablet  3  . meloxicam (MOBIC) 15 MG tablet TAKE 1 TABLET (15 MG TOTAL) BY MOUTH DAILY.  30 tablet  11  . montelukast (SINGULAIR) 10 MG tablet Take 1 tablet (10 mg total) by mouth at bedtime.  30 tablet  3  . pregabalin (LYRICA) 150 MG capsule Take 1 capsule (150 mg total) by mouth 3 (three) times daily.  90 capsule  5  . sildenafil (VIAGRA) 100 MG tablet Take 1 tablet (100 mg total) by mouth as needed for erectile dysfunction.  10 tablet  11  . tiZANidine (ZANAFLEX) 4 MG tablet TAKE 1 TABLET AT BEDTIME AS NEEDED  30 tablet  5  .  tiZANidine (ZANAFLEX) 4 MG tablet TAKE 1 TABLET AT BEDTIME AS NEEDED  30 tablet  11  . traMADol (ULTRAM) 50 MG tablet TAKE 1 TABLET (50 MG TOTAL) BY MOUTH EVERY 6 (SIX) HOURS AS NEEDED FOR PAIN.  50 tablet  5  . zolpidem (AMBIEN) 10 MG tablet TAKE 1/2 TO 1 TABLET BY MOUTH 30 MINUTES BEFORE BEDTIME  30 tablet  3  . [DISCONTINUED] fluticasone (FLONASE) 50 MCG/ACT nasal spray Place 2 sprays into both nostrils daily.  16 g  12   No current facility-administered medications on file prior to visit.    SLEEP ARCHITECTURE:  Total recording time: 414.5 minutes.  Total sleep time was: 369 minutes.  Sleep efficiency: 89%.  Sleep latency: 33.5 minutes.  REM latency: 132.5 minutes.  Stage N1: 6.1%.  Stage N2: 73.3%.  Stage N3: 0%.  Stage R:  20.6%.  Supine sleep: 152 minutes.  Non-supine sleep: 217 minutes.  CARDIAC DATA:  Average heart rate: 63 beats per minute. Rhythm strip: sinus rhythm with PAC's.  RESPIRATORY DATA: Average respiratory rate: 13. Snoring: moderate. Average AHI: 1.   Apnea index: 0.3.  Hypopnea index: 0.7. Obstructive apnea index: 0.2.  Central apnea index: 0.  Mixed apnea index: 0.2. REM AHI: 1.6.  NREM AHI: 0.8. Supine AHI:  1.2. Non-supine AHI: 0.5.  He demonstrated truncation of airflow.  MOVEMENT/PARASOMNIA:  Periodic limb movement: 0.  Period limb movements with arousals: 0. Restroom trips: none.  OXYGEN DATA:  Baseline oxygenation: 92%. Lowest SaO2: 83%. Time spent below SaO2 90%: 8.4 minutes. Supplemental oxygen used: none.  IMPRESSION/ RECOMMENDATION:   While he did have a few respiratory events the severity was not enough to qualify for diagnosis of obstructive sleep apnea.  He did however have truncation of airflow associated with arousals and this is suggestive of upper airway resistance syndrome.   Chesley Mires, M.D. Diplomate, [employer]  ELECTRONICALLY SIGNED ON:  06/17/2013, 7:57 AM [place] PH: [phone]  [phone]   FX: [phone] [place]

## 2013-06-17 NOTE — Telephone Encounter (Signed)
lmtcb x1 

## 2013-06-18 NOTE — Telephone Encounter (Signed)
lmtcb x2 

## 2013-06-18 NOTE — Telephone Encounter (Signed)
OV has been made for 06/23/13 at 3pm.

## 2013-06-23 ENCOUNTER — Encounter: Payer: Self-pay | Admitting: Family Medicine

## 2013-06-23 ENCOUNTER — Ambulatory Visit (INDEPENDENT_AMBULATORY_CARE_PROVIDER_SITE_OTHER): Payer: BC Managed Care – PPO | Admitting: Family Medicine

## 2013-06-23 ENCOUNTER — Ambulatory Visit (INDEPENDENT_AMBULATORY_CARE_PROVIDER_SITE_OTHER): Payer: BC Managed Care – PPO | Admitting: Pulmonary Disease

## 2013-06-23 ENCOUNTER — Encounter: Payer: Self-pay | Admitting: Pulmonary Disease

## 2013-06-23 ENCOUNTER — Ambulatory Visit (INDEPENDENT_AMBULATORY_CARE_PROVIDER_SITE_OTHER)
Admission: RE | Admit: 2013-06-23 | Discharge: 2013-06-23 | Disposition: A | Discharge status: Home / Self Care | Payer: BC Managed Care – PPO | Source: Ambulatory Visit | Attending: Family Medicine | Admitting: Family Medicine

## 2013-06-23 VITALS — BP 120/90 | HR 96 | Temp 98.2°F | Ht 68.0 in | Wt 174.0 lb

## 2013-06-23 VITALS — BP 122/90 | HR 92 | Ht 68.0 in | Wt 177.0 lb

## 2013-06-23 DIAGNOSIS — M542 Cervicalgia: Secondary | ICD-10-CM

## 2013-06-23 DIAGNOSIS — G478 Other sleep disorders: Secondary | ICD-10-CM

## 2013-06-23 MED ORDER — CELECOXIB 200 MG PO CAPS: 200.0000 mg | ORAL_CAPSULE | Freq: Every day | ORAL | Status: DC

## 2013-06-23 NOTE — Patient Instructions (Signed)
Follow up as needed

## 2013-06-23 NOTE — Progress Notes (Signed)
Pre visit review using our clinic review tool, if applicable. No additional management support is needed unless otherwise documented below in the visit note. 

## 2013-06-23 NOTE — Patient Instructions (Addendum)
Stop meloxicam and ibuprofen We will call with neck X-ray results.  Start celebrex for pain and inflammation.  Increase tramadol temporarily to 3 times a day  Follow up next 1-2 week with Dr. Lorelei Pont.

## 2013-06-23 NOTE — Progress Notes (Signed)
   Subjective:    Patient ID: Raymond [name], male    DOB: 18-May-1975, 38 y.o.   MRN: [id number]  HPI  38 year old male pt of Dr. Lillie Fragmin presents with new onset   Worsening ight shoulder pain after picking up heavy load. Always has some amount of pain in right shoulder. He has pain laterally in shoulder, radiates down right arm. Also pain in right neck and shoulder blade.  Pain  10/10 on pain scale.  No numbness in hand. No weakness.  He has history of fibromyalgia, neck and back pain: on chronic tramadol, zanaflex and lyrica ( MAX). THESE DID NOT HELP AT ALL WITH CURRENT PAIN. 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 No past surgery on neck or shoulder.   Has not tolerated prednisone in past ( caused joint swelling in past) Allergy to percocet and vicodin.  HAs had steroid inj in neck and shoulder without relief in apst.    Review of Systems  Constitutional: Positive for fatigue.  HENT: Negative for ear pain.   Eyes: Negative for pain.  Respiratory: Positive for cough and wheezing. Negative for shortness of breath.   Cardiovascular: Negative for chest pain, palpitations and leg swelling.       Objective:   Physical Exam  Constitutional: Vital signs are normal. He appears well-developed and well-nourished.  Non-toxic appearance. He does not appear ill. No distress.  HENT:  Head: Normocephalic and atraumatic.  Right Ear: Hearing, tympanic membrane, external ear and ear canal normal. No tenderness. No foreign bodies. Tympanic membrane is not retracted and not bulging.  Left Ear: Hearing, tympanic membrane, external ear and ear canal normal. No tenderness. No foreign bodies. Tympanic membrane is not retracted and not bulging.  Nose: Nose normal. No mucosal edema or rhinorrhea. Right sinus exhibits no maxillary sinus tenderness and no frontal sinus tenderness. Left sinus exhibits no maxillary sinus  tenderness and no frontal sinus tenderness.  Mouth/Throat: Uvula is midline, oropharynx is clear and moist and mucous membranes are normal. Normal dentition. No dental caries. No oropharyngeal exudate or tonsillar abscesses.  Eyes: Conjunctivae, EOM and lids are normal. Pupils are equal, round, and reactive to light. Lids are everted and swept, no foreign bodies found.  Neck: Trachea normal, normal range of motion and phonation normal. Neck supple. Carotid bruit is not present. No mass and no thyromegaly present.  Cardiovascular: Normal rate, regular rhythm, S1 normal, S2 normal, normal heart sounds, intact distal pulses and normal pulses.  Exam reveals no gallop.   No murmur heard. Pulmonary/Chest: Effort normal. No respiratory distress. He has wheezes. He has no rhonchi. He has no rales.  Scattered wheeze.  Abdominal: Soft. Normal appearance and bowel sounds are normal. There is no hepatosplenomegaly. There is no tenderness. There is no rebound, no guarding and no CVA tenderness. No hernia.  Neurological: He is alert. He has normal reflexes.  Skin: Skin is warm, dry and intact. No rash noted.  Psychiatric: He has a normal mood and affect. His speech is normal and behavior is normal. Judgment normal.          Assessment & Plan:

## 2013-06-23 NOTE — Assessment & Plan Note (Signed)
Sleep study showed UARS, but not OSA.  Reviewed results with him.  Explained difficulty with insurance coverage for therapy for UARS.  Advised him to try positional therapy.  Also advised that he could speak with his dentist about getting mouth guard for snoring.  Also discussed the importance of maintaining his weight.  He can return to pulmonary/sleep as needed if his sleep pattern gets worse.

## 2013-06-23 NOTE — Progress Notes (Signed)
No chief complaint on file.   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  He is here to review his sleep study.  This did not show sleep apnea, but did show UARS.  TESTS: PSG 06/13/13 >> AHI 1, SaO2 low 83%, UARS.  Raymond [name]  has a past medical history of Migraine; Fibromyalgia; Heart palpitations; and Hypertension (04/06/2013).  Raymond [name]  has past surgical history that includes Cholecystectomy.  Prior to Admission medications   Medication Sig Start Date End Date Taking? Authorizing Provider  Cholecalciferol (VITAMIN D PO) Take 5,000 mg by mouth daily.   Yes Historical Provider, MD  hydrochlorothiazide (HYDRODIURIL) 12.5 MG tablet Take 1 tablet (12.5 mg total) by mouth daily. 04/05/13  Yes [name], MD  ipratropium (ATROVENT) 0.06 % nasal spray PLACE 2 SPRAYS INTO BOTH NOSTRILS 4 (FOUR) TIMES DAILY. 04/29/13  Yes [name], MD  meloxicam (MOBIC) 15 MG tablet TAKE 1 TABLET (15 MG TOTAL) BY MOUTH DAILY. 02/18/13  Yes [name], MD  montelukast (SINGULAIR) 10 MG tablet Take 1 tablet (10 mg total) by mouth at bedtime. 03/18/13  Yes Amy Cletis [place], MD  pregabalin (LYRICA) 150 MG capsule Take 1 capsule (150 mg total) by mouth 3 (three) times daily. 01/18/13  Yes [name], MD  sildenafil (VIAGRA) 100 MG tablet Take 1 tablet (100 mg total) by mouth as needed for erectile dysfunction. 09/21/12  Yes [name], MD  tiZANidine (ZANAFLEX) 4 MG tablet Take 4 mg by mouth every 6 (six) hours as needed for muscle spasms.   Yes Historical Provider, MD  traMADol (ULTRAM) 50 MG tablet TAKE 1 TABLET (50 MG TOTAL) BY MOUTH EVERY 6 (SIX) HOURS AS NEEDED FOR PAIN. 11/16/12  Yes [name], MD  zolpidem (AMBIEN) 10 MG tablet TAKE 1/2 TO 1 TABLET BY MOUTH 30 MINUTES BEFORE BEDTIME 11/20/12  Yes [name], MD    Allergies  Allergen Reactions  . Azithromycin   . Percocet [Oxycodone-Acetaminophen] Hives  . Vicodin  [Hydrocodone-Acetaminophen] Hives     Physical Exam:  General - No distress ENT - No sinus tenderness, no oral exudate, no LAN, high arched palate, 2+ tonsils, enlarged tongue Cardiac - s1s2 regular, no murmur Chest - No wheeze/rales/dullness Back - No focal tenderness Abd - Soft, non-tender Ext - No edema Neuro - Normal strength Skin - No rashes Psych - normal mood, and behavior   Assessment/Plan:  Chesley Mires, MD [place] [facility] Pager:  [phone]

## 2013-06-24 ENCOUNTER — Telehealth: Payer: Self-pay | Admitting: *Deleted

## 2013-06-24 NOTE — Telephone Encounter (Signed)
See result note.  

## 2013-06-24 NOTE — Telephone Encounter (Signed)
Kenney Houseman (wife) left voicemail requesting x-ray results.  Please Advise.

## 2013-06-25 NOTE — Telephone Encounter (Signed)
[name] notified no fracture or arthritis seen on x-ray.  Continue as planned.  If not improving in the next 1-2 weeks then Raymond [name] will need to follow up with Dr. Lorelei Pont.

## 2013-07-29 ENCOUNTER — Other Ambulatory Visit: Payer: Self-pay | Admitting: Family Medicine

## 2013-07-29 NOTE — Telephone Encounter (Signed)
Last office visit  06/23/2013 with Dr. Diona Browner.  Celebrex not on current medication list.  Ok to refill?

## 2013-07-29 NOTE — Telephone Encounter (Signed)
Last office visit 06/23/2013 with Dr. Diona Browner.  Last refilled 11/16/2012 for #50 with 5 refills. Ok to refill?

## 2013-07-29 NOTE — Telephone Encounter (Signed)
Called to [facility] [address]

## 2013-07-29 NOTE — Telephone Encounter (Signed)
Ok, 50, 5 ref 

## 2013-07-30 ENCOUNTER — Telehealth: Payer: Self-pay

## 2013-07-30 NOTE — Telephone Encounter (Signed)
It appears to be he is on 150 mg tid.  This is a high dose.   Max is 600 total daily.  I would not put anyone on 300 mg tid.

## 2013-07-30 NOTE — Telephone Encounter (Signed)
Pt left v/m requesting status of tramadol. Spoke with Langley Gauss at [facility] and she did not receive refill call on 07/29/13. Medication phoned to [place] as instructed. Left v/m as instructed by Kenney Houseman that rx was called in and to ck with pharmacy.

## 2013-07-30 NOTE — Telephone Encounter (Signed)
Left message for Raymond [name] to return my call. 

## 2013-07-30 NOTE — Telephone Encounter (Signed)
[name] pts wife said pt thought Lyrica was going to be increased at some point to 300 mg taking three times a day. Med list has Lyrica 150 mg taking three times a day and that is how pt is presently taking med.Kenney Houseman said pt is doing well on that dosage but pt thought dosage was supposed to be 300 mg tid.Please advise.

## 2013-07-30 NOTE — Telephone Encounter (Signed)
[name] notified that [facility] should only be taking 150 mg three times a day per Dr. Lorelei Pont.

## 2013-09-11 ENCOUNTER — Encounter: Payer: Self-pay | Admitting: Internal Medicine

## 2013-09-11 ENCOUNTER — Ambulatory Visit (INDEPENDENT_AMBULATORY_CARE_PROVIDER_SITE_OTHER): Payer: BC Managed Care – PPO | Admitting: Internal Medicine

## 2013-09-11 VITALS — BP 120/100 | Temp 97.7°F | Wt 170.0 lb

## 2013-09-11 DIAGNOSIS — R1013 Epigastric pain: Secondary | ICD-10-CM | POA: Insufficient documentation

## 2013-09-11 MED ORDER — OMEPRAZOLE 20 MG PO CPDR: 20.0000 mg | DELAYED_RELEASE_CAPSULE | Freq: Every day | ORAL | Status: DC

## 2013-09-11 NOTE — Assessment & Plan Note (Addendum)
Fairly classic story for gastric ulcer Will have him stop the celebrex (use acetaminophen instead for now) Start omeprazole bid  Check labs for H pylori  Follow up Dr Lorelei Pont in 2 weeks or so

## 2013-09-11 NOTE — Patient Instructions (Addendum)
Please stop the celebrex. Use the omeprazole prescription twice a day on an empty stomach. Please set up blood work in the next couple of days. (CBC, met C, H pylori) Set up an appointment to see Dr Lorelei Pont in about 2 weeks  Peptic Ulcer A peptic ulcer is a sore in the lining of your esophagus (esophageal ulcer), stomach (gastric ulcer), or in the first part of your small intestine (duodenal ulcer). The ulcer causes erosion into the deeper tissue. CAUSES  Normally, the lining of the stomach and the small intestine protects itself from the acid that digests food. The protective lining can be damaged by:  An infection caused by a bacterium called Helicobacter pylori (H. pylori).  Regular use of nonsteroidal anti-inflammatory drugs (NSAIDs), such as ibuprofen or aspirin.  Smoking tobacco. Other risk factors include being older than 61, drinking alcohol excessively, and having a family history of ulcer disease.  SYMPTOMS   Burning pain or gnawing in the area between the chest and the belly button.  Heartburn.  Nausea and vomiting.  Bloating. The pain can be worse on an empty stomach and at night. If the ulcer results in bleeding, it can cause:  Black, tarry stools.  Vomiting of bright red blood.  Vomiting of coffee-ground-looking materials. DIAGNOSIS  A diagnosis is usually made based upon your history and an exam. Other tests and procedures may be performed to find the cause of the ulcer. Finding a cause will help determine the best treatment. Tests and procedures may include:  Blood tests, stool tests, or breath tests to check for the bacterium H. pylori.  An upper gastrointestinal (GI) series of the esophagus, stomach, and small intestine.  An endoscopy to examine the esophagus, stomach, and small intestine.  A biopsy. TREATMENT  Treatment may include:  Eliminating the cause of the ulcer, such as smoking, NSAIDs, or alcohol.  Medicines to reduce the amount of acid in  your digestive tract.  Antibiotic medicines if the ulcer is caused by the H. pylori bacterium.  An upper endoscopy to treat a bleeding ulcer.  Surgery if the bleeding is severe or if the ulcer created a hole somewhere in the digestive system. HOME CARE INSTRUCTIONS   Avoid tobacco, alcohol, and caffeine. Smoking can increase the acid in the stomach, and continued smoking will impair the healing of ulcers.  Avoid foods and drinks that seem to cause discomfort or aggravate your ulcer.  Only take medicines as directed by your caregiver. Do not substitute over-the-counter medicines for prescription medicines without talking to your caregiver.  Keep any follow-up appointments and tests as directed. SEEK MEDICAL CARE IF:   Your do not improve within 7 days of starting treatment.  You have ongoing indigestion or heartburn. SEEK IMMEDIATE MEDICAL CARE IF:   You have sudden, sharp, or persistent abdominal pain.  You have bloody or dark black, tarry stools.  You vomit blood or vomit that looks like coffee grounds.  You become light-headed, weak, or feel faint.  You become sweaty or clammy. MAKE SURE YOU:   Understand these instructions.  Will watch your condition.  Will get help right away if you are not doing well or get worse. Document Released: 02/02/2000 Document Revised: 06/21/2013 Document Reviewed: 09/04/2011 [facility] Patient Information 2015 [place], [place]. This information is not intended to replace advice given to you by your health care provider. Make sure you discuss any questions you have with your health care provider.

## 2013-09-11 NOTE — Progress Notes (Signed)
Subjective:    Patient ID: Raymond [name], male    DOB: Jul 05, 1975, 38 y.o.   MRN: [id number]  HPI Here with wife and kids  Having epigastric stabbing pain all this week Starts with "sticking" sensation--which then worsens to burning sensation that radiates down  Some better with eating  Appetite is off some No vomiting--though some nausea Loose stools since gallbladder surgery-- 3-4 times a day. No recent change  On celecoxib daily No OTC NSAIDs Rare alcohol No coffee--- usually water, may have as much as 2 sodas a day  Current Outpatient Prescriptions on File Prior to Visit  Medication Sig Dispense Refill  . celecoxib (CELEBREX) 200 MG capsule TAKE 1 CAPSULE BY MOUTH DAILY.  30 capsule  5  . Cholecalciferol (VITAMIN D PO) Take 5,000 mg by mouth daily.      . hydrochlorothiazide (HYDRODIURIL) 12.5 MG tablet Take 1 tablet (12.5 mg total) by mouth daily.  30 tablet  5  . ipratropium (ATROVENT) 0.06 % nasal spray PLACE 2 SPRAYS INTO BOTH NOSTRILS 4 (FOUR) TIMES DAILY.  15 mL  5  . meloxicam (MOBIC) 15 MG tablet TAKE 1 TABLET (15 MG TOTAL) BY MOUTH DAILY.  30 tablet  3  . montelukast (SINGULAIR) 10 MG tablet Take 1 tablet (10 mg total) by mouth at bedtime.  30 tablet  3  . pregabalin (LYRICA) 150 MG capsule Take 1 capsule (150 mg total) by mouth 3 (three) times daily.  90 capsule  5  . sildenafil (VIAGRA) 100 MG tablet Take 1 tablet (100 mg total) by mouth as needed for erectile dysfunction.  10 tablet  11  . tiZANidine (ZANAFLEX) 4 MG tablet Take 4 mg by mouth every 6 (six) hours as needed for muscle spasms.      . traMADol (ULTRAM) 50 MG tablet TAKE 1 TABLET (50 MG TOTAL) BY MOUTH EVERY 6 (SIX) HOURS AS NEEDED FOR PAIN.  50 tablet  5  . zolpidem (AMBIEN) 10 MG tablet TAKE 1/2 TO 1 TABLET BY MOUTH 30 MINUTES BEFORE BEDTIME  30 tablet  3  . [DISCONTINUED] fluticasone (FLONASE) 50 MCG/ACT nasal spray Place 2 sprays into both nostrils daily.  16 g  12   No current  facility-administered medications on file prior to visit.    Allergies  Allergen Reactions  . Azithromycin   . Percocet [Oxycodone-Acetaminophen] Hives  . Vicodin [Hydrocodone-Acetaminophen] Hives    Past Medical History  Diagnosis Date  . Migraine   . Fibromyalgia   . Heart palpitations   . Hypertension 04/06/2013    Past Surgical History  Procedure Laterality Date  . Cholecystectomy      Family History  Problem Relation Age of Onset  . Lung disease Father   . Diabetes Father   . Diabetes Mother   . Heart disease Mother     History   Social History  . Marital Status: Married    Spouse Name: N/A    Number of Children: N/A  . Years of Education: N/A   Occupational History  . electrician    Social History Main Topics  . Smoking status: Current Every Day Smoker -- 1.00 packs/day    Types: Cigarettes  . Smokeless tobacco: Never Used  . Alcohol Use: No  . Drug Use: No  . Sexual Activity: Not on file   Other Topics Concern  . Not on file   Social History Narrative   Mom was in hospital with long QT syndrome, then heart in A Fib.  Sister has long QT syndrome   1st cousin: long QT syndrome         Review of Systems No fever No cough but will have sensation of chest tightness when the pain is bad Weight is stable or up some----stomach seems bloated No blood in stool but has been dark for some time    Objective:   Physical Exam  Constitutional: He appears well-developed and well-nourished. No distress.  Neck: Normal range of motion. Neck supple. No thyromegaly present.  Pulmonary/Chest: Effort normal and breath sounds normal. No respiratory distress. He has no wheezes. He has no rales.  Abdominal: Soft. Bowel sounds are normal. He exhibits no distension and no mass. There is no rebound and no guarding.  Moderate epigastric tenderness  Musculoskeletal: He exhibits no edema and no tenderness.  Lymphadenopathy:    He has no cervical adenopathy.           Assessment & Plan:

## 2013-09-11 NOTE — Progress Notes (Signed)
Pre visit review using our clinic review tool, if applicable. No additional management support is needed unless otherwise documented below in the visit note. 

## 2013-09-13 ENCOUNTER — Telehealth: Payer: Self-pay | Admitting: Radiology

## 2013-09-13 DIAGNOSIS — R1013 Epigastric pain: Secondary | ICD-10-CM

## 2013-09-13 NOTE — Addendum Note (Signed)
Addended by: Ellamae Sia on: 09/13/2013 10:36 AM   Modules accepted: Orders

## 2013-09-13 NOTE — Telephone Encounter (Signed)
lmom for patient to call and schedule a lab appt

## 2013-09-13 NOTE — Telephone Encounter (Signed)
Message copied by Ellamae Sia on Mon Sep 13, 2013 10:33 AM ------      Message from: Viviana Simpler I      Created: Sat Sep 11, 2013 11:59 AM       Please set him up for labs--- H pylori, met C, CBC (epigastric pain) ------

## 2013-09-15 ENCOUNTER — Other Ambulatory Visit (INDEPENDENT_AMBULATORY_CARE_PROVIDER_SITE_OTHER): Payer: BC Managed Care – PPO

## 2013-09-15 DIAGNOSIS — R1013 Epigastric pain: Secondary | ICD-10-CM

## 2013-09-15 LAB — COMPREHENSIVE METABOLIC PANEL
ALT: 42 U/L (ref 0–53)
AST: 26 U/L (ref 0–37)
Albumin: 4.6 g/dL (ref 3.5–5.2)
Alkaline Phosphatase: 62 U/L (ref 39–117)
BUN: 14 mg/dL (ref 6–23)
CO2: 28 mEq/L (ref 19–32)
Calcium: 9.6 mg/dL (ref 8.4–10.5)
Chloride: 102 mEq/L (ref 96–112)
Creatinine, Ser: 1.1 mg/dL (ref 0.4–1.5)
GFR: 84.99 mL/min (ref >60.00)
Glucose, Bld: 97 mg/dL (ref 70–99)
Potassium: 3.8 mEq/L (ref 3.5–5.1)
Sodium: 137 mEq/L (ref 135–145)
Total Bilirubin: 0.8 mg/dL (ref 0.2–1.2)
Total Protein: 7.9 g/dL (ref 6.0–8.3)

## 2013-09-15 LAB — H. PYLORI ANTIBODY, IGG: H Pylori IgG: NEGATIVE

## 2013-09-20 ENCOUNTER — Ambulatory Visit: Payer: BC Managed Care – PPO | Admitting: Family Medicine

## 2013-09-20 DIAGNOSIS — Z0289 Encounter for other administrative examinations: Secondary | ICD-10-CM

## 2013-09-23 ENCOUNTER — Telehealth: Payer: Self-pay | Admitting: Family Medicine

## 2013-09-23 NOTE — Telephone Encounter (Signed)
tonya called wanting to know if Raymond [name] can be referred dr Joya Salm @ [facility].

## 2013-09-24 ENCOUNTER — Other Ambulatory Visit: Payer: Self-pay | Admitting: Family Medicine

## 2013-09-24 DIAGNOSIS — M542 Cervicalgia: Principal | ICD-10-CM

## 2013-09-24 DIAGNOSIS — G8929 Other chronic pain: Secondary | ICD-10-CM

## 2013-09-24 DIAGNOSIS — M549 Dorsalgia, unspecified: Secondary | ICD-10-CM

## 2013-09-24 NOTE — Telephone Encounter (Signed)
done

## 2013-09-29 ENCOUNTER — Ambulatory Visit (INDEPENDENT_AMBULATORY_CARE_PROVIDER_SITE_OTHER)
Admission: RE | Admit: 2013-09-29 | Discharge: 2013-09-29 | Disposition: A | Discharge status: Home / Self Care | Payer: BC Managed Care – PPO | Source: Ambulatory Visit | Attending: Family Medicine | Admitting: Family Medicine

## 2013-09-29 ENCOUNTER — Encounter: Payer: Self-pay | Admitting: Family Medicine

## 2013-09-29 ENCOUNTER — Ambulatory Visit (INDEPENDENT_AMBULATORY_CARE_PROVIDER_SITE_OTHER): Payer: BC Managed Care – PPO | Admitting: Family Medicine

## 2013-09-29 VITALS — BP 110/78 | HR 70 | Temp 97.8°F | Ht 68.0 in | Wt 170.8 lb

## 2013-09-29 DIAGNOSIS — M5412 Radiculopathy, cervical region: Secondary | ICD-10-CM

## 2013-09-29 DIAGNOSIS — M5010 Cervical disc disorder with radiculopathy, unspecified cervical region: Secondary | ICD-10-CM

## 2013-09-29 DIAGNOSIS — R1013 Epigastric pain: Secondary | ICD-10-CM

## 2013-09-29 DIAGNOSIS — R1084 Generalized abdominal pain: Secondary | ICD-10-CM

## 2013-09-29 LAB — CBC WITH DIFFERENTIAL/PLATELET
Basophils Absolute: 0 10*3/uL (ref 0.0–0.1)
Basophils Relative: 0.3 % (ref 0.0–3.0)
Eosinophils Absolute: 0.1 10*3/uL (ref 0.0–0.7)
Eosinophils Relative: 0.9 % (ref 0.0–5.0)
HCT: 46.5 % (ref 39.0–52.0)
Hemoglobin: 16 g/dL (ref 13.0–17.0)
Lymphocytes Relative: 21.3 % (ref 12.0–46.0)
Lymphs Abs: 2.9 10*3/uL (ref 0.7–4.0)
MCHC: 34.3 g/dL (ref 30.0–36.0)
MCV: 90.3 fl (ref 78.0–100.0)
Monocytes Absolute: 0.7 10*3/uL (ref 0.1–1.0)
Monocytes Relative: 4.8 % (ref 3.0–12.0)
Neutro Abs: 10 10*3/uL — ABNORMAL HIGH (ref 1.4–7.7)
Neutrophils Relative %: 72.7 % (ref 43.0–77.0)
Platelets: 225 10*3/uL (ref 150.0–400.0)
RBC: 5.15 Mil/uL (ref 4.22–5.81)
RDW: 12.8 % (ref 11.5–15.5)
WBC: 13.8 10*3/uL — ABNORMAL HIGH (ref 4.0–10.5)

## 2013-09-29 LAB — LIPASE: Lipase: 24 U/L (ref 11.0–59.0)

## 2013-09-29 MED ORDER — IOHEXOL 300 MG/ML  SOLN
100.0000 mL | Freq: Once | INTRAMUSCULAR | Status: AC | PRN
  Administered 2013-09-29: 100 mL via INTRAVENOUS

## 2013-09-29 NOTE — Progress Notes (Signed)
[place] [address] Phone: [phone] Fax: [phone]  Patient ID: Raymond [name] MRN: [id number], DOB: 1975/10/14, 38 y.o. Date of Encounter: 09/29/2013  Primary Physician:  [name], MD   Chief Complaint: Follow-up and Neck Pain   Subjective:   History of Present Illness:  Raymond [name] is a 38 y.o. very pleasant male patient who presents with the following:  F/u OV with Dr. Silvio Pate for abdominal pain - he felt like he was having an ulcer and placed on Prilosec and recommended d/c NSAIDS.   Epigastric tenderness. Not always worse with eating. Tender throughout abdomen. His tenderness is the most in the abdomen in the epigastric region, but he is also having some relatively diffuse abdominal pain as well. He has not been found in her having any vomiting with blood in it. He has not had any bloody stool or melanotic stools.Overall he is just tender and uncomfortable in his abdomen. He is not really able to save this isparticularly worse with foods or certain foods.  Neck bothering him a lot. This is a been a chronic ongoing issue with the patient, he is seeing multiple specialists, and has had multiple MRIs. Multiple people and felt that this was nonoperative in nature. At this point he is being managed with pain management taking Lyrica 150 mg 3 times daily, Zanaflex,UltramCelebrex. He had been doing relatively well, but his fibromyalgia pain has been under good control, but he is now having acute pain in the neck and having pain is acute on chronic with radiculopathy.  Past Medical History, Surgical History, Social History, Family History, Problem List, Medications, and Allergies have been reviewed and updated if relevant.  Outpatient Encounter Prescriptions as of 09/29/2013  Medication Sig  . Cholecalciferol (VITAMIN D PO) Take 5,000 mg by mouth daily.  . hydrochlorothiazide (HYDRODIURIL) 12.5 MG tablet Take 1 tablet (12.5 mg total) by mouth daily.  Marland Kitchen  ipratropium (ATROVENT) 0.06 % nasal spray PLACE 2 SPRAYS INTO BOTH NOSTRILS 4 (FOUR) TIMES DAILY.  . montelukast (SINGULAIR) 10 MG tablet Take 1 tablet (10 mg total) by mouth at bedtime.  Marland Kitchen omeprazole (PRILOSEC) 20 MG capsule Take 1 capsule (20 mg total) by mouth daily.  . pregabalin (LYRICA) 150 MG capsule Take 1 capsule (150 mg total) by mouth 3 (three) times daily.  . sildenafil (VIAGRA) 100 MG tablet Take 1 tablet (100 mg total) by mouth as needed for erectile dysfunction.  Marland Kitchen tiZANidine (ZANAFLEX) 4 MG tablet Take 4 mg by mouth every 6 (six) hours as needed for muscle spasms.  . traMADol (ULTRAM) 50 MG tablet TAKE 1 TABLET (50 MG TOTAL) BY MOUTH EVERY 6 (SIX) HOURS AS NEEDED FOR PAIN.  Marland Kitchen zolpidem (AMBIEN) 10 MG tablet TAKE 1/2 TO 1 TABLET BY MOUTH 30 MINUTES BEFORE BEDTIME  . celecoxib (CELEBREX) 200 MG capsule TAKE 1 CAPSULE BY MOUTH DAILY.     Review of Systems: ROS: GEN: Acute illness details above GI: Tolerating PO intake GU: maintaining adequate hydration and urination Pulm: No SOB Neuro as above Interactive and getting along well at home.  Otherwise, ROS is as per the HPI.   Objective:   Physical Examination: BP 110/78  Pulse 70  Temp(Src) 97.8 F (36.6 C) (Oral)  Ht 5\' 8"  (1.727 m)  Wt 170 lb 12 oz (77.452 kg)  BMI 25.97 kg/m2  GEN: WDWN, NAD, Non-toxic, A & O x 3 HEENT: Atraumatic, Normocephalic. Neck supple. No masses, No LAD. Ears and Nose: No external  deformity. CV: RRR, No M/G/R. No JVD. No thrill. No extra heart sounds. PULM: CTA B, no wheezes, crackles, rhonchi. No retractions. No resp. distress. No accessory muscle use. ABD: S, moderate tenderness diffusely, more in the epigastric region, ND, +BS. No rebound. No HSM. EXTR: No c/c/e NEURO Normal gait.  PSYCH: Normally interactive. Conversant. Not depressed or anxious appearing.  Calm demeanor.     Laboratory and Imaging Data: Recent Results (from the past 2160 hour(s))  COMPREHENSIVE METABOLIC PANEL      Status: None   Collection Time    09/15/13  9:24 AM      Result Value Ref Range   Sodium 137  135 - 145 mEq/L   Potassium 3.8  3.5 - 5.1 mEq/L   Chloride 102  96 - 112 mEq/L   CO2 28  19 - 32 mEq/L   Glucose, Bld 97  70 - 99 mg/dL   BUN 14  6 - 23 mg/dL   Creatinine, Ser 1.1  0.4 - 1.5 mg/dL   Total Bilirubin 0.8  0.2 - 1.2 mg/dL   Alkaline Phosphatase 62  39 - 117 U/L   AST 26  0 - 37 U/L   ALT 42  0 - 53 U/L   Total Protein 7.9  6.0 - 8.3 g/dL   Albumin 4.6  3.5 - 5.2 g/dL   Calcium 9.6  8.4 - 10.5 mg/dL   GFR 84.99  >60.00 mL/min  H. PYLORI ANTIBODY, IGG     Status: None   Collection Time    09/15/13  9:24 AM      Result Value Ref Range   H Pylori IgG Negative  Negative  CBC WITH DIFFERENTIAL     Status: Abnormal   Collection Time    09/29/13 11:36 AM      Result Value Ref Range   WBC 13.8 (*) 4.0 - 10.5 K/uL   RBC 5.15  4.22 - 5.81 Mil/uL   Hemoglobin 16.0  13.0 - 17.0 g/dL   HCT 46.5  39.0 - 52.0 %   MCV 90.3  78.0 - 100.0 fl   MCHC 34.3  30.0 - 36.0 g/dL   RDW 12.8  11.5 - 15.5 %   Platelets 225.0  150.0 - 400.0 K/uL   Neutrophils Relative % 72.7  43.0 - 77.0 %   Lymphocytes Relative 21.3  12.0 - 46.0 %   Monocytes Relative 4.8  3.0 - 12.0 %   Eosinophils Relative 0.9  0.0 - 5.0 %   Basophils Relative 0.3  0.0 - 3.0 %   Neutro Abs 10.0 (*) 1.4 - 7.7 K/uL   Lymphs Abs 2.9  0.7 - 4.0 K/uL   Monocytes Absolute 0.7  0.1 - 1.0 K/uL   Eosinophils Absolute 0.1  0.0 - 0.7 K/uL   Basophils Absolute 0.0  0.0 - 0.1 K/uL  LIPASE     Status: None   Collection Time    09/29/13 11:36 AM      Result Value Ref Range   Lipase 24.0  11.0 - 59.0 U/L   Ct Abdomen Pelvis W Contrast  09/29/2013   CLINICAL DATA:  Persistent right lower quadrant pain for several weeks.  EXAM: CT ABDOMEN AND PELVIS WITH CONTRAST  TECHNIQUE: Multidetector CT imaging of the abdomen and pelvis was performed using the standard protocol following bolus administration of intravenous contrast.  CONTRAST:   176mL OMNIPAQUE IOHEXOL 300 MG/ML  SOLN  COMPARISON:  None.  FINDINGS: Lung bases are clear. No pleural  or pericardial fluid. The liver has a normal appearance without focal lesions or biliary ductal dilatation. Previous cholecystectomy. The spleen is normal. The pancreas is normal. The adrenal glands are normal. The kidneys are normal. The aorta and IVC ear normal. No retroperitoneal mass or adenopathy. The appendix is normal. No other bowel pathology. No hernia. The bladder is normal. Prostate gland and seminal vesicles appear unremarkable. No bony abnormality seen.  IMPRESSION: Normal study. No abnormality seen to explain right lower quadrant pain. Normal appendix.   Electronically Signed   By: Nelson Chimes M.D.   On: 09/29/2013 16:19    Assessment & Plan:   Generalized abdominal pain - Plan: CBC with Differential, Lipase, CT Abdomen Pelvis W Contrast  Abdominal pain, epigastric  Cervical disc disorder with radiculopathy of cervical region  Level of Medical Decision-Making in this case is Moderate.  Generalized abdominal pain, more in the epigastric region, obtain a CT of the abdomen and pelvis to rule out potential appendicitis, necrotic pancreatitis, diverticulitis, or other process it could explain this patient's ongoing abdominal pain.  At this time the patient CT of his abdomen and pelvis does come back and is normal. He has a normal lipase. His Helicobacter pylori is negative, and all of his other laboratories are stable with the exception of a mildly elevated white count. At this point it is very likely the patient does have peptic ulcer disease. I think keeping him on his PPI is the best choice.  Follow-up: No Follow-up on file.  New Prescriptions   No medications on file   Orders Placed This Encounter  Procedures  . CT Abdomen Pelvis W Contrast  . CBC with Differential  . Lipase    Signed,  Romel Dumond T. Katlen Seyer, MD, [facility] [place], [address] Phone: [phone] Fax: [phone]

## 2013-09-29 NOTE — Patient Instructions (Signed)

## 2013-09-29 NOTE — Progress Notes (Signed)
Pre visit review using our clinic review tool, if applicable. No additional management support is needed unless otherwise documented below in the visit note. 

## 2013-10-06 ENCOUNTER — Telehealth: Payer: Self-pay

## 2013-10-06 DIAGNOSIS — R1084 Generalized abdominal pain: Secondary | ICD-10-CM

## 2013-10-06 NOTE — Telephone Encounter (Signed)
He needs to stop NSAIDS.  Omeprazole if ulcer will not work in a week. Take 1 po bid. Expect 4 weeks plus for effect.   I am going to get him in to see GI for their opinion about his stomach.

## 2013-10-06 NOTE — Telephone Encounter (Signed)
Mrs Cannata said pt still having sharp pain on and off at belly button especially when stands up. omeprazole not effective and wants substitute med for omeprazole.Celebrex was helping neck pain but pt was advised to stop Celebrex; wants to know if different med could prescribe for neck pain. [facility]. Mrs Casebeer request cb.

## 2013-10-06 NOTE — Telephone Encounter (Signed)
Left message for [name] to return my call. 

## 2013-10-07 NOTE — Telephone Encounter (Signed)
Raymond [name] (wife) notified as instructed by telephone.  She states Raymond [name] is already taking the omeprazole two times a day.  Advised to expect 4 weeks plus for it to take effect.  Also advised that Raymond [name] or Raymond [name] would be calling them once they have his appointment scheduled with the GI doctor.

## 2013-10-11 ENCOUNTER — Encounter: Payer: Self-pay | Admitting: Internal Medicine

## 2013-11-05 ENCOUNTER — Telehealth: Payer: Self-pay

## 2013-11-05 DIAGNOSIS — R41840 Attention and concentration deficit: Secondary | ICD-10-CM

## 2013-11-05 NOTE — Telephone Encounter (Signed)
[name] pts wife left v/m; pt is going to be starting back to school and pt wants to start med for concentration.Nicole Kindred request cb.

## 2013-11-07 NOTE — Telephone Encounter (Signed)
i don't start anyone on stimulants without an extensive psychological evaluation. I don't recall discussing with him in the past. 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 If they have concerns, we can get psychiatry or psychology involved for further testing.  Electronically Signed  By: [name], MD On: 11/07/2013 4:48 PM

## 2013-11-08 NOTE — Telephone Encounter (Signed)
Left message for [name] to return my call. 

## 2013-11-08 NOTE — Telephone Encounter (Addendum)
Raymond [name] notified as instructed by telephone.  She wants to check with [employer] to find out his work schedule and will call us back to let us know when they are ready for the referral.

## 2013-11-23 ENCOUNTER — Telehealth: Payer: Self-pay

## 2013-11-23 NOTE — Telephone Encounter (Signed)
Pt's wife, Kenney Houseman said pt is out of town today; started on 11/21/13 with non prod cough,both ears ache and low grade fever. Scheduled appt with Dr Lorelei Pont on 11/24/13 at 9:45 AM.if pt condition changes or worsens prior to appt pt will cb.

## 2013-11-24 ENCOUNTER — Encounter: Payer: Self-pay | Admitting: Family Medicine

## 2013-11-24 ENCOUNTER — Ambulatory Visit (INDEPENDENT_AMBULATORY_CARE_PROVIDER_SITE_OTHER): Payer: BC Managed Care – PPO | Admitting: Family Medicine

## 2013-11-24 VITALS — BP 124/88 | HR 83 | Temp 98.1°F | Ht 68.0 in | Wt 174.8 lb

## 2013-11-24 DIAGNOSIS — H65111 Acute and subacute allergic otitis media (mucoid) (sanguinous) (serous), right ear: Secondary | ICD-10-CM

## 2013-11-24 DIAGNOSIS — J0100 Acute maxillary sinusitis, unspecified: Secondary | ICD-10-CM

## 2013-11-24 MED ORDER — AMOXICILLIN-POT CLAVULANATE 875-125 MG PO TABS: 1.0000 | ORAL_TABLET | Freq: Two times a day (BID) | ORAL | Status: DC

## 2013-11-24 NOTE — Progress Notes (Signed)
Dr. Frederico Hamman T. Temple Sporer, MD, [facility] [place], [address] Phone: [phone] Fax: [phone]  11/24/2013  Patient: Raymond [name], MRN: [id number], DOB: 04-23-1975, 38 y.o.  Primary Physician:  [name], MD  Chief Complaint: Cough, Otalgia and Fever  Subjective:   Raymond [name] is a 38 y.o. very pleasant male patient who presents with the following:  Bodyaches, throat has been itchy and scratchy. Fever to 100. Has a lot of facial pain in max sinuses.  He has been feeling bad all week. Right ear started to feel worse and worse and now it has a lot of pain.  Past Medical History, Surgical History, Social History, Family History, Problem List, Medications, and Allergies have been reviewed and updated if relevant.  ROS: GEN: Acute illness details above GI: Tolerating PO intake GU: maintaining adequate hydration and urination Pulm: No SOB Interactive and getting along well at home.  Otherwise, ROS is as per the HPI.   Objective:   BP 124/88  Pulse 83  Temp(Src) 98.1 F (36.7 C) (Oral)  Ht 5\' 8"  (1.727 m)  Wt 174 lb 12 oz (79.266 kg)  BMI 26.58 kg/m2  SpO2 97%   Gen: WDWN, NAD; A & O x3, cooperative. Pleasant.Globally Non-toxic HEENT: Normocephalic and atraumatic. Throat clear, w/o exudate, R TM bulging and red, L TM - good landmarks, + fluid present. rhinnorhea.  MMM Frontal sinuses: NT Max sinuses: mild t NECK: Anterior cervical  LAD is absent CV: RRR, No M/G/R, cap refill <2 sec PULM: Breathing comfortably in no respiratory distress. no wheezing, crackles, rhonchi EXT: No c/c/e PSYCH: Friendly, good eye contact MSK: Nml gait     Laboratory and Imaging Data:  Assessment and Plan:   Acute mucoid otitis media of right ear  Acute maxillary sinusitis, recurrence not specified  OM + URI vs. Early sinusitis  Follow-up: No Follow-up on file.  New Prescriptions   AMOXICILLIN-CLAVULANATE (AUGMENTIN) 875-125 MG PER TABLET    Take 1 tablet by mouth 2 (two) times daily.   No orders of the defined types were placed in this encounter.    Signed,  Maud Deed. Levester Waldridge, MD   Patient's Medications  New Prescriptions   AMOXICILLIN-CLAVULANATE (AUGMENTIN) 875-125 MG PER TABLET    Take 1 tablet by mouth 2 (two) times daily.  Previous Medications   CELECOXIB (CELEBREX) 200 MG CAPSULE    TAKE 1 CAPSULE BY MOUTH DAILY.   CHOLECALCIFEROL (VITAMIN D PO)    Take 5,000 mg by mouth daily.   HYDROCHLOROTHIAZIDE (HYDRODIURIL) 12.5 MG TABLET    Take 1 tablet (12.5 mg total) by mouth daily.   IPRATROPIUM (ATROVENT) 0.06 % NASAL SPRAY    PLACE 2 SPRAYS INTO BOTH NOSTRILS 4 (FOUR) TIMES DAILY.   MONTELUKAST (SINGULAIR) 10 MG TABLET    Take 1 tablet (10 mg total) by mouth at bedtime.   OMEPRAZOLE (PRILOSEC) 20 MG CAPSULE    Take 1 capsule (20 mg total) by mouth daily.   PREGABALIN (LYRICA) 150 MG CAPSULE    Take 1 capsule (150 mg total) by mouth 3 (three) times daily.   SILDENAFIL (VIAGRA) 100 MG TABLET    Take 1 tablet (100 mg total) by mouth as needed for erectile dysfunction.   TIZANIDINE (ZANAFLEX) 4 MG TABLET    Take 4 mg by mouth every 6 (six) hours as needed for muscle spasms.   TRAMADOL (ULTRAM) 50 MG TABLET    TAKE 1 TABLET (  50 MG TOTAL) BY MOUTH EVERY 6 (SIX) HOURS AS NEEDED FOR PAIN.   ZOLPIDEM (AMBIEN) 10 MG TABLET    TAKE 1/2 TO 1 TABLET BY MOUTH 30 MINUTES BEFORE BEDTIME  Modified Medications   No medications on file  Discontinued Medications   No medications on file

## 2013-11-24 NOTE — Progress Notes (Signed)
Pre visit review using our clinic review tool, if applicable. No additional management support is needed unless otherwise documented below in the visit note. 

## 2013-12-01 ENCOUNTER — Ambulatory Visit: Payer: BC Managed Care – PPO | Admitting: Internal Medicine

## 2013-12-03 ENCOUNTER — Telehealth: Payer: Self-pay

## 2013-12-03 MED ORDER — MOXIFLOXACIN HCL 400 MG PO TABS: 400.0000 mg | ORAL_TABLET | Freq: Every day | ORAL | Status: DC

## 2013-12-03 NOTE — Telephone Encounter (Signed)
Raymond [name] called back and pt does want to get referral for testing or psychological evaluation. Raymond [name] request cb.

## 2013-12-03 NOTE — Telephone Encounter (Signed)
Kenney Houseman (wife) notified as instructed by telephone.

## 2013-12-03 NOTE — Telephone Encounter (Signed)
[name] pts wife said pt is presently taking augmentin and pt cannot see improvement in lt earache and pressure; now pain level 8-9. Pt was seen 11/24/13; no fever now but still has non productive cough and scratchy throat.pt using Zyrtec and nasal rinse.pt does not want to schedule appt but does want different antibiotic.[facility]. [name] request cb.

## 2013-12-03 NOTE — Telephone Encounter (Signed)
done

## 2013-12-03 NOTE — Addendum Note (Signed)
Addended by: [name] on: 12/03/2013 01:46 PM   Modules accepted: Orders

## 2013-12-03 NOTE — Telephone Encounter (Signed)
Stop augmentin , change to  avelox x 7 days.

## 2014-01-18 DIAGNOSIS — M19019 Primary osteoarthritis, unspecified shoulder: Secondary | ICD-10-CM

## 2014-01-18 DIAGNOSIS — M7521 Bicipital tendinitis, right shoulder: Secondary | ICD-10-CM

## 2014-01-18 DIAGNOSIS — M7540 Impingement syndrome of unspecified shoulder: Secondary | ICD-10-CM

## 2014-01-18 HISTORY — DX: Impingement syndrome of unspecified shoulder: M75.40

## 2014-01-18 HISTORY — DX: Primary osteoarthritis, unspecified shoulder: M19.019

## 2014-01-18 HISTORY — DX: Bicipital tendinitis, right shoulder: M75.21

## 2014-01-26 ENCOUNTER — Ambulatory Visit (INDEPENDENT_AMBULATORY_CARE_PROVIDER_SITE_OTHER): Payer: BC Managed Care – PPO | Admitting: Family Medicine

## 2014-01-26 ENCOUNTER — Encounter: Payer: Self-pay | Admitting: Family Medicine

## 2014-01-26 VITALS — BP 130/82 | HR 65 | Temp 97.6°F | Ht 68.0 in | Wt 169.0 lb

## 2014-01-26 DIAGNOSIS — M2391 Unspecified internal derangement of right knee: Secondary | ICD-10-CM

## 2014-01-26 DIAGNOSIS — R5383 Other fatigue: Secondary | ICD-10-CM

## 2014-01-26 DIAGNOSIS — R6882 Decreased libido: Secondary | ICD-10-CM

## 2014-01-26 LAB — TSH: TSH: 1.09 u[IU]/mL (ref 0.35–4.50)

## 2014-01-26 NOTE — Progress Notes (Signed)
Dr. Frederico Hamman T. Sherisa Gilvin, MD, [facility] [place], [address] Phone: [phone] Fax: [phone]  01/26/2014  Patient: Raymond [name], MRN: [id number], DOB: 10-Oct-1975, 38 y.o.  Primary Physician:  [name], MD  Chief Complaint: Knee Pain and Libido  Subjective:   Raymond [name] is a 38 y.o. very pleasant male patient who presents with the following:  Not wanting to have sex at all now. He was able to get an erection. He is not having any libido at all right now, and is not having intercourse at all right now.  Testosterone. ? tsh?  Gone back teaching   Patient presents with 1 month h/o R sided knee pain after no clear injury. No audible pop was heard. The patient has not had an effusion. + occ symptomatic giving-way. No mechanical clicking. Joint has not locked up. Patient has been able to walk but is limping. The patient does not have pain going up and down stairs or rising from a seated position.   Pain location: medial Current physical activity: teaching martial arts Prior Knee Surgery: none Current pain meds: per ehr Bracing: none  Past Medical History, Surgical History, Social History, Family History, Problem List, Medications, and Allergies have been reviewed and updated if relevant.   GEN: No acute illnesses, no fevers, chills. GI: No n/v/d, eating normally Pulm: No SOB Interactive and getting along well at home.  Otherwise, ROS is as per the HPI.  Objective:   BP 130/82 mmHg  Pulse 65  Temp(Src) 97.6 F (36.4 C) (Oral)  Ht 5\' 8"  (1.727 m)  Wt 169 lb (76.658 kg)  BMI 25.70 kg/m2  SpO2 99%  GEN: WDWN, NAD, Non-toxic, A & O x 3 HEENT: Atraumatic, Normocephalic. Neck supple. No masses, No LAD. Ears and Nose: No external deformity. CV: RRR, No M/G/R. No JVD. No thrill. No extra heart sounds. PULM: CTA B, no wheezes, crackles, rhonchi. No retractions. No resp. distress. No accessory  muscle use. EXTR: No c/c/e NEURO Normal gait.  PSYCH: Normally interactive. Conversant. Not depressed or anxious appearing.  Calm demeanor.   Knee:  RIGHT Gait: Normal heel toe pattern ROM: 0-130 Effusion: neg Echymosis or edema: none Patellar tendon NT Painful PLICA: neg Patellar grind: negative Medial and lateral patellar facet loading: negative medial and lateral joint lines: medially Mcmurray's pain Flexion-pinch neg Varus and valgus stress: stable Lachman: neg Ant and Post drawer: neg Hip abduction, IR, ER: WNL Hip flexion str: 5/5 Hip abd: 5/5 Quad: 5/5 VMO atrophy:No Hamstring concentric and eccentric: 5/5   Laboratory and Imaging Data:  Assessment and Plan:   Derangement, knee internal, right  Decreased libido - Plan: Testosterone, free, total, TSH  Other fatigue - Plan: Testosterone, free, total, TSH  I suspicious that the patient has a medial meniscal tear. Discussed options including an MRI if positive surgical consultation, but right now he wants to be conservative and try taking some anti-inflammatories and icing his knee for the next 2 or 3 months.  Libido is always challenging. Will check his testosterone level and a thyroid level. We discussed that if his testosterone is low, we will get a repeat a.m. Testosterone.  Follow-up: No Follow-up on file.  New Prescriptions   No medications on file   Orders Placed This Encounter  Procedures  . Testosterone, free, total  . TSH    Signed,  Andersen Iorio T. Farhiya Rosten, MD   Patient's Medications  New Prescriptions  No medications on file  Previous Medications   CHOLECALCIFEROL (VITAMIN D PO)    Take 5,000 mg by mouth daily.   CYCLOBENZAPRINE (FLEXERIL) 5 MG TABLET       FLUTICASONE (FLONASE) 50 MCG/ACT NASAL SPRAY       HYDROCHLOROTHIAZIDE (HYDRODIURIL) 12.5 MG TABLET    Take 1 tablet (12.5 mg total) by mouth daily.   ZOLPIDEM (AMBIEN) 10 MG TABLET    TAKE 1/2 TO 1 TABLET BY MOUTH 30 MINUTES BEFORE  BEDTIME  Modified Medications   No medications on file  Discontinued Medications   CELECOXIB (CELEBREX) 200 MG CAPSULE    TAKE 1 CAPSULE BY MOUTH DAILY.   IPRATROPIUM (ATROVENT) 0.06 % NASAL SPRAY    PLACE 2 SPRAYS INTO BOTH NOSTRILS 4 (FOUR) TIMES DAILY.   MONTELUKAST (SINGULAIR) 10 MG TABLET    Take 1 tablet (10 mg total) by mouth at bedtime.   MOXIFLOXACIN (AVELOX) 400 MG TABLET    Take 1 tablet (400 mg total) by mouth daily.   OMEPRAZOLE (PRILOSEC) 20 MG CAPSULE    Take 1 capsule (20 mg total) by mouth daily.   PREGABALIN (LYRICA) 150 MG CAPSULE    Take 1 capsule (150 mg total) by mouth 3 (three) times daily.   SILDENAFIL (VIAGRA) 100 MG TABLET    Take 1 tablet (100 mg total) by mouth as needed for erectile dysfunction.   TIZANIDINE (ZANAFLEX) 4 MG TABLET    Take 4 mg by mouth every 6 (six) hours as needed for muscle spasms.   TRAMADOL (ULTRAM) 50 MG TABLET    TAKE 1 TABLET (50 MG TOTAL) BY MOUTH EVERY 6 (SIX) HOURS AS NEEDED FOR PAIN.

## 2014-01-26 NOTE — Progress Notes (Signed)
Pre visit review using our clinic review tool, if applicable. No additional management support is needed unless otherwise documented below in the visit note. 

## 2014-01-27 LAB — TESTOSTERONE, FREE, TOTAL, SHBG
Sex Hormone Binding: 34 nmol/L (ref 13–71)
Testosterone, Free: 67 pg/mL (ref 47.0–244.0)
Testosterone-% Free: 2 % (ref 1.6–2.9)
Testosterone: 342 ng/dL (ref 300–890)

## 2014-02-17 ENCOUNTER — Other Ambulatory Visit: Payer: Self-pay | Admitting: Family Medicine

## 2014-02-17 ENCOUNTER — Other Ambulatory Visit: Payer: Self-pay | Admitting: Orthopedic Surgery

## 2014-02-17 ENCOUNTER — Encounter (HOSPITAL_BASED_OUTPATIENT_CLINIC_OR_DEPARTMENT_OTHER)
Admission: RE | Admit: 2014-02-17 | Discharge: 2014-02-17 | Disposition: A | Discharge status: Home / Self Care | Payer: BLUE CROSS/BLUE SHIELD | Source: Ambulatory Visit | Attending: Orthopedic Surgery | Admitting: Orthopedic Surgery

## 2014-02-17 ENCOUNTER — Encounter (HOSPITAL_BASED_OUTPATIENT_CLINIC_OR_DEPARTMENT_OTHER): Payer: Self-pay | Admitting: *Deleted

## 2014-02-17 DIAGNOSIS — Z87891 Personal history of nicotine dependence: Secondary | ICD-10-CM | POA: Diagnosis not present

## 2014-02-17 DIAGNOSIS — M25811 Other specified joint disorders, right shoulder: Secondary | ICD-10-CM | POA: Diagnosis not present

## 2014-02-17 DIAGNOSIS — Y999 Unspecified external cause status: Secondary | ICD-10-CM | POA: Diagnosis not present

## 2014-02-17 DIAGNOSIS — Z01818 Encounter for other preprocedural examination: Secondary | ICD-10-CM | POA: Insufficient documentation

## 2014-02-17 DIAGNOSIS — M7541 Impingement syndrome of right shoulder: Secondary | ICD-10-CM | POA: Diagnosis not present

## 2014-02-17 DIAGNOSIS — Y929 Unspecified place or not applicable: Secondary | ICD-10-CM | POA: Diagnosis not present

## 2014-02-17 DIAGNOSIS — M7551 Bursitis of right shoulder: Secondary | ICD-10-CM | POA: Diagnosis not present

## 2014-02-17 DIAGNOSIS — M19011 Primary osteoarthritis, right shoulder: Secondary | ICD-10-CM | POA: Diagnosis not present

## 2014-02-17 DIAGNOSIS — S43431A Superior glenoid labrum lesion of right shoulder, initial encounter: Secondary | ICD-10-CM | POA: Diagnosis not present

## 2014-02-17 DIAGNOSIS — X58XXXA Exposure to other specified factors, initial encounter: Secondary | ICD-10-CM | POA: Diagnosis not present

## 2014-02-17 DIAGNOSIS — Z79899 Other long term (current) drug therapy: Secondary | ICD-10-CM | POA: Diagnosis not present

## 2014-02-17 DIAGNOSIS — M25511 Pain in right shoulder: Secondary | ICD-10-CM | POA: Diagnosis present

## 2014-02-17 DIAGNOSIS — I10 Essential (primary) hypertension: Secondary | ICD-10-CM | POA: Diagnosis not present

## 2014-02-17 DIAGNOSIS — Y939 Activity, unspecified: Secondary | ICD-10-CM | POA: Diagnosis not present

## 2014-02-17 LAB — BASIC METABOLIC PANEL
Anion gap: 8 (ref 5–15)
BUN: 21 mg/dL (ref 6–23)
CO2: 28 mmol/L (ref 19–32)
Calcium: 9.2 mg/dL (ref 8.4–10.5)
Chloride: 103 mEq/L (ref 96–112)
Creatinine, Ser: 1.02 mg/dL (ref 0.50–1.35)
GFR calc Af Amer: >90 mL/min (ref >90)
GFR calc non Af Amer: >90 mL/min (ref >90)
Glucose, Bld: 64 mg/dL — ABNORMAL LOW (ref 70–99)
Potassium: 4 mmol/L (ref 3.5–5.1)
Sodium: 139 mmol/L (ref 135–145)

## 2014-02-17 NOTE — Pre-Procedure Instructions (Signed)
To come for BMET and EKG 

## 2014-02-21 ENCOUNTER — Ambulatory Visit (HOSPITAL_BASED_OUTPATIENT_CLINIC_OR_DEPARTMENT_OTHER)
Admission: RE | Admit: 2014-02-21 | Discharge: 2014-02-21 | Disposition: A | Discharge status: Home / Self Care | Payer: BLUE CROSS/BLUE SHIELD | Source: Ambulatory Visit | Attending: Orthopedic Surgery | Admitting: Orthopedic Surgery

## 2014-02-21 ENCOUNTER — Encounter (HOSPITAL_BASED_OUTPATIENT_CLINIC_OR_DEPARTMENT_OTHER): Payer: Self-pay | Admitting: *Deleted

## 2014-02-21 ENCOUNTER — Ambulatory Visit (HOSPITAL_BASED_OUTPATIENT_CLINIC_OR_DEPARTMENT_OTHER): Payer: BLUE CROSS/BLUE SHIELD | Admitting: Anesthesiology

## 2014-02-21 ENCOUNTER — Encounter (HOSPITAL_BASED_OUTPATIENT_CLINIC_OR_DEPARTMENT_OTHER)
Admission: RE | Disposition: A | Discharge status: Home / Self Care | Payer: Self-pay | Source: Ambulatory Visit | Attending: Orthopedic Surgery

## 2014-02-21 DIAGNOSIS — Z87891 Personal history of nicotine dependence: Secondary | ICD-10-CM | POA: Insufficient documentation

## 2014-02-21 DIAGNOSIS — M7551 Bursitis of right shoulder: Secondary | ICD-10-CM | POA: Insufficient documentation

## 2014-02-21 DIAGNOSIS — M7541 Impingement syndrome of right shoulder: Secondary | ICD-10-CM | POA: Insufficient documentation

## 2014-02-21 DIAGNOSIS — S43431A Superior glenoid labrum lesion of right shoulder, initial encounter: Secondary | ICD-10-CM | POA: Diagnosis not present

## 2014-02-21 DIAGNOSIS — Z79899 Other long term (current) drug therapy: Secondary | ICD-10-CM | POA: Insufficient documentation

## 2014-02-21 DIAGNOSIS — Y999 Unspecified external cause status: Secondary | ICD-10-CM | POA: Insufficient documentation

## 2014-02-21 DIAGNOSIS — I10 Essential (primary) hypertension: Secondary | ICD-10-CM | POA: Insufficient documentation

## 2014-02-21 DIAGNOSIS — M19011 Primary osteoarthritis, right shoulder: Secondary | ICD-10-CM | POA: Insufficient documentation

## 2014-02-21 DIAGNOSIS — Y929 Unspecified place or not applicable: Secondary | ICD-10-CM | POA: Insufficient documentation

## 2014-02-21 DIAGNOSIS — X58XXXA Exposure to other specified factors, initial encounter: Secondary | ICD-10-CM | POA: Insufficient documentation

## 2014-02-21 DIAGNOSIS — Y939 Activity, unspecified: Secondary | ICD-10-CM | POA: Insufficient documentation

## 2014-02-21 DIAGNOSIS — M25811 Other specified joint disorders, right shoulder: Secondary | ICD-10-CM | POA: Insufficient documentation

## 2014-02-21 HISTORY — DX: Primary osteoarthritis, unspecified shoulder: M19.019

## 2014-02-21 HISTORY — DX: Bicipital tendinitis, right shoulder: M75.21

## 2014-02-21 HISTORY — PX: SHOULDER ARTHROSCOPY WITH SUBACROMIAL DECOMPRESSION: SHX5684

## 2014-02-21 HISTORY — DX: Essential (primary) hypertension: I10

## 2014-02-21 HISTORY — PX: RESECTION DISTAL CLAVICAL: SHX5053

## 2014-02-21 HISTORY — DX: Personal history of peptic ulcer disease: Z87.11

## 2014-02-21 HISTORY — DX: Personal history of other diseases of the digestive system: Z87.19

## 2014-02-21 HISTORY — DX: Impingement syndrome of unspecified shoulder: M75.40

## 2014-02-21 LAB — POCT HEMOGLOBIN-HEMACUE: Hemoglobin: 17.4 g/dL — ABNORMAL HIGH (ref 13.0–17.0)

## 2014-02-21 SURGERY — SHOULDER ARTHROSCOPY WITH SUBACROMIAL DECOMPRESSION
Anesthesia: General | Site: Shoulder | Laterality: Right

## 2014-02-21 MED ORDER — TRAMADOL HCL 50 MG PO TABS: 50.0000 mg | ORAL_TABLET | ORAL | Status: DC | PRN

## 2014-02-21 MED ORDER — LACTATED RINGERS IV SOLN
INTRAVENOUS | Status: DC
  Administered 2014-02-21 (×2): via INTRAVENOUS

## 2014-02-21 MED ORDER — FENTANYL CITRATE 0.05 MG/ML IJ SOLN
INTRAMUSCULAR | Status: AC
  Filled 2014-02-21: qty 2

## 2014-02-21 MED ORDER — LIDOCAINE HCL 4 % MT SOLN
OROMUCOSAL | Status: DC | PRN
Start: 2014-02-21 — End: 2014-02-21
  Administered 2014-02-21: 3 mL via TOPICAL

## 2014-02-21 MED ORDER — MIDAZOLAM HCL 2 MG/2ML IJ SOLN
1.0000 mg | INTRAMUSCULAR | Status: DC | PRN
Start: 2014-02-21 — End: 2014-02-21
  Administered 2014-02-21: 2 mg via INTRAVENOUS

## 2014-02-21 MED ORDER — HYDROMORPHONE HCL 1 MG/ML IJ SOLN: 0.2500 mg | INTRAMUSCULAR | Status: DC | PRN

## 2014-02-21 MED ORDER — FENTANYL CITRATE 0.05 MG/ML IJ SOLN
50.0000 ug | INTRAMUSCULAR | Status: DC | PRN
  Administered 2014-02-21: 100 ug via INTRAVENOUS

## 2014-02-21 MED ORDER — PROPOFOL 10 MG/ML IV BOLUS
INTRAVENOUS | Status: DC | PRN
  Administered 2014-02-21: 200 mg via INTRAVENOUS
  Administered 2014-02-21: 30 mg via INTRAVENOUS

## 2014-02-21 MED ORDER — SUCCINYLCHOLINE CHLORIDE 20 MG/ML IJ SOLN
INTRAMUSCULAR | Status: DC | PRN
  Administered 2014-02-21: 100 mg via INTRAVENOUS

## 2014-02-21 MED ORDER — HYDROMORPHONE HCL 2 MG PO TABS: 2.0000 mg | ORAL_TABLET | ORAL | Status: DC | PRN

## 2014-02-21 MED ORDER — CEFAZOLIN SODIUM-DEXTROSE 2-3 GM-% IV SOLR
INTRAVENOUS | Status: DC | PRN
  Administered 2014-02-21: 2 g via INTRAVENOUS

## 2014-02-21 MED ORDER — MIDAZOLAM HCL 2 MG/2ML IJ SOLN
INTRAMUSCULAR | Status: AC
  Filled 2014-02-21: qty 2

## 2014-02-21 MED ORDER — SODIUM CHLORIDE 0.9 % IR SOLN
Status: DC | PRN
  Administered 2014-02-21: 4500 mL

## 2014-02-21 MED ORDER — CEFAZOLIN SODIUM-DEXTROSE 2-3 GM-% IV SOLR
INTRAVENOUS | Status: AC
  Filled 2014-02-21: qty 50

## 2014-02-21 MED ORDER — DEXAMETHASONE SODIUM PHOSPHATE 4 MG/ML IJ SOLN
INTRAMUSCULAR | Status: DC | PRN
  Administered 2014-02-21: 10 mg via INTRAVENOUS

## 2014-02-21 MED ORDER — ONDANSETRON HCL 4 MG/2ML IJ SOLN
INTRAMUSCULAR | Status: DC | PRN
  Administered 2014-02-21: 4 mg via INTRAVENOUS

## 2014-02-21 MED ORDER — MIDAZOLAM HCL 2 MG/ML PO SYRP: 12.0000 mg | ORAL_SOLUTION | Freq: Once | ORAL | Status: DC | PRN

## 2014-02-21 MED ORDER — PROMETHAZINE HCL 25 MG/ML IJ SOLN: 6.2500 mg | INTRAMUSCULAR | Status: DC | PRN

## 2014-02-21 SURGICAL SUPPLY — 82 items
BENZOIN TINCTURE PRP APPL 2/3 (GAUZE/BANDAGES/DRESSINGS) IMPLANT
BLADE CLIPPER SURG (BLADE) IMPLANT
BLADE SURG 15 STRL LF DISP TIS (BLADE) IMPLANT
BLADE SURG 15 STRL SS (BLADE)
BLADE VORTEX 6.0 (BLADE) IMPLANT
BUR OVAL 4.0 (BURR) ×2 IMPLANT
CANISTER SUCT 3000ML (MISCELLANEOUS) IMPLANT
CANNULA 5.75X71 LONG (CANNULA) ×2 IMPLANT
CANNULA TWIST IN 8.25X7CM (CANNULA) IMPLANT
CHLORAPREP W/TINT 26ML (MISCELLANEOUS) ×2 IMPLANT
DECANTER SPIKE VIAL GLASS SM (MISCELLANEOUS) IMPLANT
DRAPE INCISE IOBAN 66X45 STRL (DRAPES) ×2 IMPLANT
DRAPE STERI 35X30 U-POUCH (DRAPES) ×2 IMPLANT
DRAPE SURG 17X23 STRL (DRAPES) ×2 IMPLANT
DRAPE U 20/CS (DRAPES) ×2 IMPLANT
DRAPE U-SHAPE 47X51 STRL (DRAPES) ×2 IMPLANT
DRAPE U-SHAPE 76X120 STRL (DRAPES) ×4 IMPLANT
DRSG PAD ABDOMINAL 8X10 ST (GAUZE/BANDAGES/DRESSINGS) ×2 IMPLANT
ELECT REM PT RETURN 9FT ADLT (ELECTROSURGICAL) ×2
ELECTRODE REM PT RTRN 9FT ADLT (ELECTROSURGICAL) ×1 IMPLANT
GAUZE SPONGE 4X4 12PLY STRL (GAUZE/BANDAGES/DRESSINGS) ×2 IMPLANT
GAUZE SPONGE 4X4 16PLY XRAY LF (GAUZE/BANDAGES/DRESSINGS) IMPLANT
GAUZE XEROFORM 1X8 LF (GAUZE/BANDAGES/DRESSINGS) ×2 IMPLANT
GLOVE BIO SURGEON STRL SZ7 (GLOVE) ×2 IMPLANT
GLOVE BIO SURGEON STRL SZ7.5 (GLOVE) ×2 IMPLANT
GLOVE BIOGEL PI IND STRL 7.0 (GLOVE) ×2 IMPLANT
GLOVE BIOGEL PI IND STRL 8 (GLOVE) ×1 IMPLANT
GLOVE BIOGEL PI INDICATOR 7.0 (GLOVE) ×2
GLOVE BIOGEL PI INDICATOR 8 (GLOVE) ×1
GLOVE ECLIPSE 6.5 STRL STRAW (GLOVE) ×2 IMPLANT
GLOVE EXAM NITRILE LRG STRL (GLOVE) ×2 IMPLANT
GOWN STRL REUS W/ TWL LRG LVL3 (GOWN DISPOSABLE) ×2 IMPLANT
GOWN STRL REUS W/TWL LRG LVL3 (GOWN DISPOSABLE) ×2
GOWN STRL REUS W/TWL XL LVL4 (GOWN DISPOSABLE) ×2 IMPLANT
IV NS IRRIG 3000ML ARTHROMATIC (IV SOLUTION) ×4 IMPLANT
LASSO CRESCENT QUICKPASS (SUTURE) IMPLANT
LIQUID BAND (GAUZE/BANDAGES/DRESSINGS) IMPLANT
MANIFOLD NEPTUNE II (INSTRUMENTS) ×2 IMPLANT
NDL SUT 6 .5 CRC .975X.05 MAYO (NEEDLE) IMPLANT
NEEDLE 1/2 CIR CATGUT .05X1.09 (NEEDLE) IMPLANT
NEEDLE MAYO TAPER (NEEDLE)
NEEDLE SCORPION MULTI FIRE (NEEDLE) IMPLANT
NS IRRIG 1000ML POUR BTL (IV SOLUTION) IMPLANT
PACK ARTHROSCOPY DSU (CUSTOM PROCEDURE TRAY) ×2 IMPLANT
PACK BASIN DAY SURGERY FS (CUSTOM PROCEDURE TRAY) ×2 IMPLANT
PENCIL BUTTON HOLSTER BLD 10FT (ELECTRODE) IMPLANT
RESECTOR FULL RADIUS 4.2MM (BLADE) ×2 IMPLANT
SLEEVE SCD COMPRESS KNEE MED (MISCELLANEOUS) ×2 IMPLANT
SLING ARM IMMOBILIZER MED (SOFTGOODS) IMPLANT
SLING ARM LRG ADULT FOAM STRAP (SOFTGOODS) ×2 IMPLANT
SLING ARM MED ADULT FOAM STRAP (SOFTGOODS) IMPLANT
SLING ARM XL FOAM STRAP (SOFTGOODS) IMPLANT
SPONGE LAP 4X18 X RAY DECT (DISPOSABLE) IMPLANT
STRIP CLOSURE SKIN 1/2X4 (GAUZE/BANDAGES/DRESSINGS) IMPLANT
SUCTION FRAZIER TIP 10 FR DISP (SUCTIONS) IMPLANT
SUPPORT WRAP ARM LG (MISCELLANEOUS) IMPLANT
SUT 2 FIBERLOOP 20 STRT BLUE (SUTURE)
SUT BONE WAX W31G (SUTURE) IMPLANT
SUT ETHIBOND 2 OS 4 DA (SUTURE) IMPLANT
SUT ETHILON 3 0 PS 1 (SUTURE) ×2 IMPLANT
SUT ETHILON 4 0 PS 2 18 (SUTURE) IMPLANT
SUT FIBERWIRE #2 38 T-5 BLUE (SUTURE)
SUT MNCRL AB 3-0 PS2 18 (SUTURE) IMPLANT
SUT MNCRL AB 4-0 PS2 18 (SUTURE) IMPLANT
SUT PDS AB 0 CT 36 (SUTURE) IMPLANT
SUT PROLENE 3 0 PS 2 (SUTURE) IMPLANT
SUT TIGER TAPE 7 IN WHITE (SUTURE) IMPLANT
SUT VIC AB 0 CT1 27 (SUTURE)
SUT VIC AB 0 CT1 27XBRD ANBCTR (SUTURE) IMPLANT
SUT VIC AB 2-0 SH 27 (SUTURE)
SUT VIC AB 2-0 SH 27XBRD (SUTURE) IMPLANT
SUTURE 2 FIBERLOOP 20 STRT BLU (SUTURE) IMPLANT
SUTURE FIBERWR #2 38 T-5 BLUE (SUTURE) IMPLANT
SYR BULB 3OZ (MISCELLANEOUS) IMPLANT
TAPE FIBER 2MM 7IN #2 BLUE (SUTURE) IMPLANT
TOWEL OR 17X24 6PK STRL BLUE (TOWEL DISPOSABLE) ×2 IMPLANT
TOWEL OR NON WOVEN STRL DISP B (DISPOSABLE) ×2 IMPLANT
TUBE CONNECTING 20X1/4 (TUBING) ×2 IMPLANT
TUBING ARTHROSCOPY IRRIG 16FT (MISCELLANEOUS) ×2 IMPLANT
WAND STAR VAC 90 (SURGICAL WAND) ×2 IMPLANT
WATER STERILE IRR 1000ML POUR (IV SOLUTION) ×2 IMPLANT
YANKAUER SUCT BULB TIP NO VENT (SUCTIONS) IMPLANT

## 2014-02-21 NOTE — Op Note (Signed)
Procedure(s): Procedure Note  Raymond [name] male 39 y.o. 02/21/2014  Procedure(s) and Anesthesia Type:   #1 right shoulder arthroscopic debridement of partial-thickness rotator cuff tear, type I SLAP tear and extensive bursitis   #2 right shoulder arthroscopic subacromial decompression   #3 right shoulder arthroscopic distal clavicle excision  Surgeon(s) and Role:    * Nita Sells, MD - Primary     Surgeon: Nita Sells   Assistants: Jeanmarie Hubert PA-C [facility] was present and scrubbed throughout the procedure and was essential in positioning, assisting with the camera and instrumentation,, and closure)  Anesthesia: General endotracheal anesthesia with preoperative interscalene block given by the attending anesthesiologist    Procedure Detail    Estimated Blood Loss: Min         Drains: none  Blood Given: none         Specimens: none        Complications:  * No complications entered in OR log *         Disposition: PACU - hemodynamically stable.         Condition: stable    Procedure:   INDICATIONS FOR SURGERY: The patient is 39 y.o. male who has had a long history of right shoulder pain and failed extensive conservative management. He had MRI and x-ray findings consistent with chronic impingement and bursitis. He also had symptomatically before meals joint disease and possible SLAP pathology. He wished to go forward with surgery to try and decrease pain and restore function.  OPERATIVE FINDINGS: Examination under anesthesia: No stiffness or instability   DESCRIPTION OF PROCEDURE: The patient was identified in preoperative  holding area where I personally marked the operative site after  verifying site, side, and procedure with the patient. An interscalene block was given by the attending anesthesiologist the holding area.  The patient was taken back to the operating room where general anesthesia was induced without complication and  was placed in the beach-chair position with the back  elevated about 60 degrees and all extremities and head and neck carefully padded and  positioned.   The right upper extremity was then prepped and  draped in a standard sterile fashion. The appropriate time-out  procedure was carried out. The patient did receive IV antibiotics  within 30 minutes of incision.   A small posterior portal incision was made and the arthroscope was introduced into the joint. An anterior portal was then established above the subscapularis using needle localization. Small cannula was placed anteriorly. Diagnostic arthroscopy was then carried out.  He was noted to have partial-thickness tearing of the inner surface of the upper border the subscapularis involving about 10% of the thickness of the insertion. This was debrided but the shaver back to good healthy tendon. No repair was necessary. The superior labrum was examined and found to have significant fraying of the inner border which was debrided with shaver back to good healthy labrum. The probe was used to demonstrate that the biceps origin was intact without significant type II SLAP pathology. The biceps tendon itself was intact and had no evidence for significant Tina synovitis or partial tearing. Just posterior to the biceps the supraspinatus was noted to have partial-thickness articular sided tearing involving about 1 cm anterior to posterior involving the inner 10% of the tendon at this level. This was debrided extensively back to good healthy tendon which was bleeding. Infraspinatus was intact. Glenohumeral joint surfaces were intact without chondromalacia. No loose bodies.  The arthroscope was then introduced into  the subacromial space a standard lateral portal was established with needle localization. The shaver was used through the lateral portal to perform extensive bursectomy. He was noted to have extensive hypertrophied bursa.  The bursal surface of the  rotator cuff was carefully examined and found to be completely intact.  The coracoacromial ligament was taken down off the anterior acromion with the ArthroCare exposing a moderate hooked anterior acromial spur. A high-speed bur was then used through the lateral portal to take down the anterior acromial spur from lateral to medial in a standard acromioplasty.  The acromioplasty was also viewed from the lateral portal and the bur was used as necessary to ensure that the acromion was completely flat from posterior to anterior.  The distal clavicle was exposed arthroscopically and the bur was used to take off the undersurface for approximately 8 mm from the lateral portal. The bur was then moved to an anterior portal position to complete the distal clavicle excision resecting about 8 mm of the distal clavicle and a smooth even fashion. This was viewed from anterior and lateral portals and felt to be complete.  The arthroscopic equipment was removed from the joint and the portals were closed with 3-0 nylon in an interrupted fashion. Sterile dressings were then applied including Xeroform 4 x 4's ABDs and tape. The patient was then allowed to awaken from general anesthesia, placed in a sling, transferred to the stretcher and taken to the recovery room in stable condition.   POSTOPERATIVE PLAN: The patient will be discharged home today and will followup in one week for suture removal and wound check.  He can get into therapy right away.

## 2014-02-21 NOTE — H&P (Signed)
Raymond [name] is an 39 y.o. male.   Chief Complaint: R shoulder pain HPI: R shoulder pain, impingement, AC DJD failed nonoperative management.  Past Medical History  Diagnosis Date  . History of gastric ulcer     summer 2015  . Hypertension     has not been taking his medication; advised to start back on med. today  . 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 Shoulder impingement 01/2014    right  . Biceps tendonitis on right 01/2014    Past Surgical History  Procedure Laterality Date  . Cholecystectomy      Family History  Problem Relation Age of Onset  . Lung disease Father   . Diabetes Father   . Diabetes Mother   . Heart disease Mother    Social History:  reports that he quit smoking about 4 months ago. He has never used smokeless tobacco. He reports that he does not drink alcohol or use illicit drugs.  Allergies:  Allergies  Allergen Reactions  . Azithromycin Nausea And Vomiting  . Percocet [Oxycodone-Acetaminophen] Hives  . Vicodin [Hydrocodone-Acetaminophen] Hives  . Adhesive [Tape] Other (See Comments)    SKIN IRRITATION    Medications Prior to Admission  Medication Sig Dispense Refill  . cetirizine-pseudoephedrine (ZYRTEC-D) 5-120 MG per tablet Take 1 tablet by mouth 2 (two) times daily.    . Cholecalciferol (VITAMIN D PO) Take 5,000 mg by mouth daily.    . fluticasone (FLONASE) 50 MCG/ACT nasal spray USE 2 SPRAYS IN EACH NOSTRIL EVERY DAY 16 g 5  . hydrochlorothiazide (HYDRODIURIL) 12.5 MG tablet Take 1 tablet (12.5 mg total) by mouth daily. 30 tablet 5  . traMADol (ULTRAM) 50 MG tablet Take 50 mg by mouth every 6 (six) hours as needed.    . cyclobenzaprine (FLEXERIL) 5 MG tablet   0    Results for orders placed or performed during the hospital encounter of 02/21/14 (from the past 48 hour(s))  Hemoglobin-hemacue, POC     Status: Abnormal   Collection Time: 02/21/14  1:15 PM  Result Value Ref Range   Hemoglobin 17.4 (H) 13.0 - 17.0 g/dL   No  results found.  Review of Systems  All other systems reviewed and are negative.   Blood pressure 129/79, pulse 71, temperature 97.8 F (36.6 C), temperature source Oral, resp. rate 18, height 5\' 8"  (1.727 m), weight 73.483 kg (162 lb), SpO2 100 %. Physical Exam  Constitutional: He is oriented to person, place, and time. He appears well-developed and well-nourished.  HENT:  Head: Atraumatic.  Eyes: EOM are normal.  Cardiovascular: Intact distal pulses.   Respiratory: Effort normal.  Musculoskeletal:  R shoulder pain with impingement testing, TTP at [facility]  Neurological: He is alert and oriented to person, place, and time.  Skin: Skin is warm and dry.  Psychiatric: He has a normal mood and affect.     Assessment/Plan Symptomatic impingement, AC DJD, possible superior labral pathology Plan arth SAD, DCR, possible SLAP debride vs tenodesis. Risks / benefits of surgery discussed Consent on chart  NPO for OR Preop antibiotics   Shawnetta Lein WILLIAM 02/21/2014, 1:58 PM

## 2014-02-21 NOTE — Discharge Instructions (Signed)
Discharge Instructions after Arthroscopic Shoulder Surgery   A sling has been provided for you. You may remove the sling after 72 hours. The sling may be worn for your protection, if you are in a crowd.  Use ice on the shoulder intermittently over the first 48 hours after surgery.  Pain medication has been prescribed for you.  Use your medication liberally over the first 48 hours, and then begin to taper your use. You may take Extra Strength Tylenol or Tylenol only in place of the pain pills. DO NOT take ANY nonsteroidal anti-inflammatory pain medications: Advil, Motrin, Ibuprofen, Aleve, Naproxen, or Naprosyn.  You may remove your dressing after two days.  You may shower 5 days after surgery. The incision CANNOT get wet prior to 5 days. Simply allow the water to wash over the site and then pat dry. Do not rub the incision. Make sure your axilla (armpit) is completely dry after showering.  Take one aspirin a day for 2 weeks after surgery, unless you have an aspirin sensitivity/allergy or asthma.  Three to 5 times each day you should perform assisted overhead reaching and external rotation (outward turning) exercises with the operative arm. Both exercises should be done with the non-operative arm used as the "therapist arm" while the operative arm remains relaxed. Ten of each exercise should be done three to five times each day.    Overhead reach is helping to lift your stiff arm up as high as it will go. To stretch your overhead reach, lie flat on your back, relax, and grasp the wrist of the tight shoulder with your opposite hand. Using the power in your opposite arm, bring the stiff arm up as far as it is comfortable. Start holding it for ten seconds and then work up to where you can hold it for a count of 30. Breathe slowly and deeply while the arm is moved. Repeat this stretch ten times, trying to help the arm up a little higher each time.       External rotation is turning the arm out to  the side while your elbow stays close to your body. External rotation is best stretched while you are lying on your back. Hold a cane, yardstick, broom handle, or dowel in both hands. Bend both elbows to a right angle. Use steady, gentle force from your normal arm to rotate the hand of the stiff shoulder out away from your body. Continue the rotation as far as it will go comfortably, holding it there for a count of 10. Repeat this exercise ten times.     Please call [phone] during normal business hours or [phone] after hours for any problems. Including the following:  - excessive redness of the incisions - drainage for more than 4 days - fever of more than 101.5 F  *Please note that pain medications will not be refilled after hours or on weekends.    Regional Anesthesia Blocks  1. Numbness or the inability to move the "blocked" extremity may last from 3-48 hours after placement. The length of time depends on the medication injected and your individual response to the medication. If the numbness is not going away after 48 hours, call your surgeon.  2. The extremity that is blocked will need to be protected until the numbness is gone and the  Strength has returned. Because you cannot feel it, you will need to take extra care to avoid injury. Because it may be weak, you may have difficulty moving it or  using it. You may not know what position it is in without looking at it while the block is in effect.  3. For blocks in the legs and feet, returning to weight bearing and walking needs to be done carefully. You will need to wait until the numbness is entirely gone and the strength has returned. You should be able to move your leg and foot normally before you try and bear weight or walk. You will need someone to be with you when you first try to ensure you do not fall and possibly risk injury.  4. Bruising and tenderness at the needle site are common side effects and will resolve in a few  days.  5. Persistent numbness or new problems with movement should be communicated to the surgeon or the [place] [phone] [place] [phone]).    Post Anesthesia Home Care Instructions  Activity: Get plenty of rest for the remainder of the day. A responsible adult should stay with you for 24 hours following the procedure.  For the next 24 hours, DO NOT: -Drive a car -Paediatric nurse -Drink alcoholic beverages -Take any medication unless instructed by your physician -Make any legal decisions or sign important papers.  Meals: Start with liquid foods such as gelatin or soup. Progress to regular foods as tolerated. Avoid greasy, spicy, heavy foods. If nausea and/or vomiting occur, drink only clear liquids until the nausea and/or vomiting subsides. Call your physician if vomiting continues.  Special Instructions/Symptoms: Your throat may feel dry or sore from the anesthesia or the breathing tube placed in your throat during surgery. If this causes discomfort, gargle with warm salt water. The discomfort should disappear within 24 hours.

## 2014-02-21 NOTE — Progress Notes (Signed)
Assisted Dr. Singer with right, ultrasound guided, interscalene  block. Side rails up, monitors on throughout procedure. See vital signs in flow sheet. Tolerated Procedure well. 

## 2014-02-21 NOTE — Anesthesia Procedure Notes (Signed)
Procedure Name: Intubation Date/Time: 02/21/2014 2:34 PM Performed by: Maryella Shivers Pre-anesthesia Checklist: Patient identified, Emergency Drugs available, Suction available and Patient being monitored Patient Re-evaluated:Patient Re-evaluated prior to inductionOxygen Delivery Method: Circle System Utilized Preoxygenation: Pre-oxygenation with 100% oxygen Intubation Type: IV induction Ventilation: Mask ventilation without difficulty Laryngoscope Size: Mac and 3 Grade View: Grade I Tube type: Oral Tube size: 8.0 mm Number of attempts: 1 Airway Equipment and Method: stylet Placement Confirmation: ETT inserted through vocal cords under direct vision,  positive ETCO2 and breath sounds checked- equal and bilateral Secured at: 22 cm Tube secured with: Tape Dental Injury: Teeth and Oropharynx as per pre-operative assessment

## 2014-02-21 NOTE — Anesthesia Preprocedure Evaluation (Signed)
Anesthesia Evaluation  Patient identified by MRN, date of birth, ID band Patient awake    Reviewed: Allergy & Precautions, NPO status , Patient's Chart, lab work & pertinent test results  Airway Mallampati: II  TM Distance: >3 FB     Dental  (+) Teeth Intact, Dental Advidsory Given   Pulmonary former smoker,    Pulmonary exam normal       Cardiovascular hypertension,     Neuro/Psych  Headaches,    GI/Hepatic negative GI ROS, Neg liver ROS,   Endo/Other    Renal/GU negative Renal ROS     Musculoskeletal   Abdominal Normal abdominal exam  (+)   Peds  Hematology negative hematology ROS (+)   Anesthesia Other Findings   Reproductive/Obstetrics negative OB ROS                             Anesthesia Physical Anesthesia Plan  ASA: II  Anesthesia Plan: General ETT   Post-op Pain Management: MAC Combined w/ Regional for Post-op pain   Induction:   Airway Management Planned:   Additional Equipment:   Intra-op Plan:   Post-operative Plan:   Informed Consent: I have reviewed the patients History and Physical, chart, labs and discussed the procedure including the risks, benefits and alternatives for the proposed anesthesia with the patient or authorized representative who has indicated his/her understanding and acceptance.   Dental Advisory Given  Plan Discussed with: Anesthesiologist, CRNA and Surgeon  Anesthesia Plan Comments:         Anesthesia Quick Evaluation

## 2014-02-21 NOTE — Transfer of Care (Signed)
Immediate Anesthesia Transfer of Care Note  Patient: Raymond [name]  Procedure(s) Performed: Procedure(s) with comments: SHOULDER ARTHROSCOPY WITH SUBACROMIAL DECOMPRESSION, DISTAL CLAVICAL EXCISION, DEBRIDIMENT OF PARTIAL ROTATOR CUFF TEAR (Right) - Right shoulder arthroscopy subacromial decompression, distal clavical excision RESECTION DISTAL CLAVICAL (Right)  Patient Location: PACU  Anesthesia Type:GA combined with regional for post-op pain  Level of Consciousness: sedated  Airway & Oxygen Therapy: Patient Spontanous Breathing and Patient connected to face mask oxygen  Post-op Assessment: Report given to PACU RN and Post -op Vital signs reviewed and stable  Post vital signs: Reviewed and stable  Complications: No apparent anesthesia complications

## 2014-02-22 ENCOUNTER — Encounter (HOSPITAL_BASED_OUTPATIENT_CLINIC_OR_DEPARTMENT_OTHER): Payer: Self-pay | Admitting: Orthopedic Surgery

## 2014-02-22 NOTE — Anesthesia Postprocedure Evaluation (Signed)
  Anesthesia Post Note  Patient: Raymond [name]  Procedure(s) Performed: Procedure(s) (LRB): SHOULDER ARTHROSCOPY WITH SUBACROMIAL DECOMPRESSION, DISTAL CLAVICAL EXCISION, DEBRIDIMENT OF PARTIAL ROTATOR CUFF TEAR (Right) RESECTION DISTAL CLAVICAL (Right)  Anesthesia type: general  Patient location: PACU  Post pain: Pain level controlled  Post assessment: Patient's Cardiovascular Status Stable  Last Vitals:  Filed Vitals:   02/21/14 1745  BP: 122/86  Pulse: 63  Temp: 36.5 C  Resp: 20    Post vital signs: Reviewed and stable  Level of consciousness: sedated  Complications: No apparent anesthesia complications

## 2014-03-16 ENCOUNTER — Ambulatory Visit (INDEPENDENT_AMBULATORY_CARE_PROVIDER_SITE_OTHER): Payer: BLUE CROSS/BLUE SHIELD | Admitting: Psychology

## 2014-03-16 DIAGNOSIS — F900 Attention-deficit hyperactivity disorder, predominantly inattentive type: Secondary | ICD-10-CM

## 2014-04-01 ENCOUNTER — Ambulatory Visit (INDEPENDENT_AMBULATORY_CARE_PROVIDER_SITE_OTHER): Payer: BLUE CROSS/BLUE SHIELD | Admitting: Psychology

## 2014-04-01 DIAGNOSIS — F900 Attention-deficit hyperactivity disorder, predominantly inattentive type: Secondary | ICD-10-CM

## 2014-04-13 ENCOUNTER — Other Ambulatory Visit: Payer: Self-pay | Admitting: Family Medicine

## 2014-04-13 NOTE — Telephone Encounter (Signed)
Last office visit 01/26/2014 for knee pain.  Do not see where patient has had a physical since establishing care 05/24/2012.  Ok to refill?

## 2014-04-13 NOTE — Telephone Encounter (Signed)
pts wife left v/m requesting refill BP med to [facility]. Please advise.

## 2014-05-11 ENCOUNTER — Ambulatory Visit: Payer: Self-pay | Admitting: Family Medicine

## 2014-05-18 ENCOUNTER — Encounter: Payer: Self-pay | Admitting: Family Medicine

## 2014-05-18 ENCOUNTER — Ambulatory Visit (INDEPENDENT_AMBULATORY_CARE_PROVIDER_SITE_OTHER): Payer: BLUE CROSS/BLUE SHIELD | Admitting: Family Medicine

## 2014-05-18 VITALS — BP 122/82 | HR 74 | Temp 97.6°F | Ht 68.0 in | Wt 170.8 lb

## 2014-05-18 DIAGNOSIS — F900 Attention-deficit hyperactivity disorder, predominantly inattentive type: Secondary | ICD-10-CM | POA: Diagnosis not present

## 2014-05-18 MED ORDER — AMPHETAMINE-DEXTROAMPHETAMINE 10 MG PO TABS: 10.0000 mg | ORAL_TABLET | Freq: Two times a day (BID) | ORAL | Status: DC

## 2014-05-18 NOTE — Progress Notes (Signed)
Pre visit review using our clinic review tool, if applicable. No additional management support is needed unless otherwise documented below in the visit note. 

## 2014-05-18 NOTE — Patient Instructions (Signed)
1. Get a clarification on verbal instructions if needed.  2. Try to get this done ASAP, or write down instructions if needed.  3. Break down longer instructions or piece of information into smaller chunks to help with remembering.  4. Link verbal information with a visual image or mnemonic device to help memory through association.

## 2014-05-18 NOTE — Progress Notes (Signed)
Dr. Frederico Hamman T. Tziporah Knoke, MD, [facility] [place], [address] Phone: [phone] Fax: [phone]  05/18/2014  Patient: Raymond [name], MRN: [id number], DOB: 1975/04/05, 39 y.o.  Primary Physician:  [name], MD  Chief Complaint: Follow-up  Subjective:   Raymond [name] is a 39 y.o. very pleasant male patient who presents with the following:  Wt Readings from Last 3 Encounters:  05/18/14 170 lb 12 oz (77.452 kg)  02/21/14 162 lb (73.483 kg)  01/26/14 169 lb (76.658 kg)     Past Medical History, Surgical History, Social History, Family History, Problem List, Medications, and Allergies have been reviewed and updated if relevant.   GEN: No acute illnesses, no fevers, chills. GI: No n/v/d, eating normally Pulm: No SOB Interactive and getting along well at home.  Otherwise, ROS is as per the HPI.  Objective:   BP 122/82 mmHg  Pulse 74  Temp(Src) 97.6 F (36.4 C) (Oral)  Ht 5\' 8"  (1.727 m)  Wt 170 lb 12 oz (77.452 kg)  BMI 25.97 kg/m2  GEN: WDWN, NAD, Non-toxic, A & O x 3 HEENT: Atraumatic, Normocephalic. Neck supple. No masses, No LAD. Ears and Nose: No external deformity. EXTR: No c/c/e NEURO Normal gait.  PSYCH: Normally interactive. Conversant. Not depressed or anxious appearing.  Calm demeanor.   Laboratory and Imaging Data:  Assessment and Plan:   Attention deficit hyperactivity disorder (ADHD), predominantly inattentive type  >25 minutes spent in face to face time with patient, >50% spent in counselling or coordination of care: I reviewed Dr. Daron Offer evaluation with the patient in detail. We went over everything including his ADD evaluation as well as some of his IQ testing. We discussed various things that he can do at work and that he can do from an educational and testing standpoint to help with his concentration. We are going to start with Adderall at 10 mg twice a day and recheck in one  month.  Also gave him a letter for accommodations in his educational testing.  Follow-up: 1 mo  New Prescriptions   AMPHETAMINE-DEXTROAMPHETAMINE (ADDERALL) 10 MG TABLET    Take 1 tablet (10 mg total) by mouth 2 (two) times daily with a meal.   Patient Instructions  1. Get a clarification on verbal instructions if needed.  2. Try to get this done ASAP, or write down instructions if needed.  3. Break down longer instructions or piece of information into smaller chunks to help with remembering.  4. Link verbal information with a visual image or mnemonic device to help memory through association.      Signed,  Maud Deed. Chrishauna Mee, MD   Patient's Medications  New Prescriptions   AMPHETAMINE-DEXTROAMPHETAMINE (ADDERALL) 10 MG TABLET    Take 1 tablet (10 mg total) by mouth 2 (two) times daily with a meal.  Previous Medications   CETIRIZINE-PSEUDOEPHEDRINE (ZYRTEC-D) 5-120 MG PER TABLET    Take 1 tablet by mouth 2 (two) times daily.   CHOLECALCIFEROL (VITAMIN D PO)    Take 5,000 mg by mouth daily.   CYCLOBENZAPRINE (FLEXERIL) 5 MG TABLET    Take 5 mg by mouth daily as needed.    FLUTICASONE (FLONASE) 50 MCG/ACT NASAL SPRAY    USE 2 SPRAYS IN EACH NOSTRIL EVERY DAY   HYDROCHLOROTHIAZIDE (MICROZIDE) 12.5 MG CAPSULE    TAKE ONE CAPSULE BY MOUTH EVERY DAY   IBUPROFEN (ADVIL,MOTRIN) 800 MG TABLET    Take 800 mg by mouth  3 (three) times daily.  Modified Medications   No medications on file  Discontinued Medications   HYDROMORPHONE (DILAUDID) 2 MG TABLET    Take 1 tablet (2 mg total) by mouth every 4 (four) hours as needed for severe pain.

## 2014-05-19 DIAGNOSIS — F900 Attention-deficit hyperactivity disorder, predominantly inattentive type: Secondary | ICD-10-CM | POA: Insufficient documentation

## 2014-05-19 HISTORY — DX: Attention-deficit hyperactivity disorder, predominantly inattentive type: F90.0

## 2014-08-03 ENCOUNTER — Encounter: Payer: Self-pay | Admitting: Family Medicine

## 2014-08-03 ENCOUNTER — Ambulatory Visit (INDEPENDENT_AMBULATORY_CARE_PROVIDER_SITE_OTHER): Payer: BLUE CROSS/BLUE SHIELD | Admitting: Family Medicine

## 2014-08-03 VITALS — BP 130/92 | HR 78 | Temp 98.3°F | Ht 68.0 in | Wt 176.2 lb

## 2014-08-03 DIAGNOSIS — F900 Attention-deficit hyperactivity disorder, predominantly inattentive type: Secondary | ICD-10-CM

## 2014-08-03 DIAGNOSIS — R1013 Epigastric pain: Secondary | ICD-10-CM

## 2014-08-03 LAB — H. PYLORI ANTIBODY, IGG: H Pylori IgG: NEGATIVE

## 2014-08-03 LAB — LIPASE: Lipase: 22 U/L (ref 11.0–59.0)

## 2014-08-03 MED ORDER — AMPHETAMINE-DEXTROAMPHETAMINE 15 MG PO TABS: 15.0000 mg | ORAL_TABLET | Freq: Two times a day (BID) | ORAL | Status: DC

## 2014-08-03 MED ORDER — OMEPRAZOLE 20 MG PO CPDR: 20.0000 mg | DELAYED_RELEASE_CAPSULE | Freq: Two times a day (BID) | ORAL | Status: DC

## 2014-08-03 NOTE — Progress Notes (Signed)
Dr. Frederico Hamman T. Deryl Ports, MD, [facility] [place], [address] Phone: [phone] Fax: [phone]  08/03/2014  Patient: Raymond [name], MRN: [id number], DOB: 1975/09/08, 39 y.o.  Primary Physician:  [name], MD  Chief Complaint: Follow-up and Fibromyalgia  Subjective:   Raymond [name] is a 39 y.o. very pleasant male patient who presents with the following:  ADHD - still having issues concentrating. Improved with Adderrall 10 mg BID, but still having some issues.  Wt Readings from Last 3 Encounters:  08/03/14 176 lb 4 oz (79.946 kg)  05/18/14 170 lb 12 oz (77.452 kg)  02/21/14 162 lb (73.483 kg)    Having a bad nerve pain. From breathing in chest - referred to shoulder.  Stll happens Eating will make it.  He is also having epigastric pain.  Past Medical History, Surgical History, Social History, Family History, Problem List, Medications, and Allergies have been reviewed and updated if relevant.  Patient Active Problem List   Diagnosis Date Noted  . 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 Hypertension 04/06/2013  . Allergic rhinitis 01/18/2013  . Family history of long QT syndrome 06/12/2012  . 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 Smoker 06/12/2012  . Common migraine 05/26/2012  . Neck pain 05/26/2012  . Back pain 05/26/2012  . Fibromyalgia 05/26/2012    Past Medical History  Diagnosis Date  . History of gastric ulcer     summer 2015  . Hypertension     has not been taking his medication; advised to start back on med. today  . 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 Shoulder impingement 01/2014    right  . Biceps tendonitis on right 01/2014    Past Surgical History  Procedure Laterality Date  . Cholecystectomy    . Shoulder arthroscopy with subacromial decompression Right 02/21/2014    Procedure: SHOULDER ARTHROSCOPY WITH  SUBACROMIAL DECOMPRESSION, DISTAL CLAVICAL EXCISION, DEBRIDIMENT OF PARTIAL ROTATOR CUFF TEAR;  Surgeon: Nita Sells, MD;  Location: [facility];  Service: Orthopedics;  Laterality: Right;  Right shoulder arthroscopy subacromial decompression, distal clavical excision  . Resection distal clavical Right 02/21/2014    Procedure: RESECTION DISTAL CLAVICAL;  Surgeon: Nita Sells, MD;  Location: [facility];  Service: Orthopedics;  Laterality: Right;    History   Social History  . Marital Status: Married    Spouse Name: N/A  . Number of Children: N/A  . Years of Education: N/A   Occupational History  . electrician    Social History Main Topics  . Smoking status: Former Smoker    Quit date: 09/24/2013  . Smokeless tobacco: Never Used  . Alcohol Use: No  . Drug Use: No  . Sexual Activity: Not on file   Other Topics Concern  . Not on file   Social History Narrative   Mom was in hospital with long QT syndrome, then heart in A Fib.   Sister has long QT syndrome   1st cousin: long QT syndrome          Family History  Problem Relation Age of Onset  . Lung disease Father   . Diabetes Father   . Diabetes Mother   . Heart disease Mother     Allergies  Allergen Reactions  . Azithromycin Nausea And Vomiting  . Percocet [Oxycodone-Acetaminophen] Hives  . Vicodin [Hydrocodone-Acetaminophen] Hives  . Adhesive [Tape] Other (See Comments)  SKIN IRRITATION    Medication list reviewed and updated in full in [place].  GEN: no acute illness or fever CV: No chest pain or shortness of breath MSK: detailed above Neuro: neurological signs are described above ROS O/w per HPI  Objective:   BP 130/92 mmHg  Pulse 78  Temp(Src) 98.3 F (36.8 C) (Oral)  Ht 5\' 8"  (1.727 m)  Wt 176 lb 4 oz (79.946 kg)  BMI 26.80 kg/m2  GEN: WDWN, NAD, Non-toxic, A & O x 3 HEENT: Atraumatic, Normocephalic. Neck supple. No masses, No  LAD. Ears and Nose: No external deformity. CV: RRR, No M/G/R. No JVD. No thrill. No extra heart sounds. PULM: CTA B, no wheezes, crackles, rhonchi. No retractions. No resp. distress. No accessory muscle use. ABD: S, epigastric tenderness, ND, +BS. No rebound. No HSM. EXTR: No c/c/e NEURO Normal gait.  PSYCH: Normally interactive. Conversant. Not depressed or anxious appearing.  Calm demeanor.    Radiology: Results for orders placed or performed in visit on 08/03/14  Lipase  Result Value Ref Range   Lipase 22.0 11.0 - 59.0 U/L  H. pylori antibody, IgG  Result Value Ref Range   H Pylori IgG Negative Negative     Assessment and Plan:   Abdominal pain, epigastric - Plan: Lipase, H. pylori antibody, IgG  Attention deficit hyperactivity disorder (ADHD), predominantly inattentive type  Probable ulcer.  Treat as such.  Lipase negative.  H. Pylori negative.  ADD, partially treated.  Increase dosing to 15 mg twice a day of Adderall.  Follow-up: Return in about 6 weeks (around 09/14/2014).  New Prescriptions   OMEPRAZOLE (PRILOSEC) 20 MG CAPSULE    Take 1 capsule (20 mg total) by mouth 2 (two) times daily before a meal.   Orders Placed This Encounter  Procedures  . Lipase  . H. pylori antibody, IgG    Signed,  Neo Yepiz T. Vinia Jemmott, MD   Patient's Medications  New Prescriptions   OMEPRAZOLE (PRILOSEC) 20 MG CAPSULE    Take 1 capsule (20 mg total) by mouth 2 (two) times daily before a meal.  Previous Medications   CETIRIZINE-PSEUDOEPHEDRINE (ZYRTEC-D) 5-120 MG PER TABLET    Take 1 tablet by mouth 2 (two) times daily.   CHOLECALCIFEROL (VITAMIN D PO)    Take 5,000 mg by mouth daily.   CYCLOBENZAPRINE (FLEXERIL) 5 MG TABLET    Take 5 mg by mouth daily as needed.    FLUTICASONE (FLONASE) 50 MCG/ACT NASAL SPRAY    USE 2 SPRAYS IN EACH NOSTRIL EVERY DAY   HYDROCHLOROTHIAZIDE (MICROZIDE) 12.5 MG CAPSULE    TAKE ONE CAPSULE BY MOUTH EVERY DAY   IBUPROFEN (ADVIL,MOTRIN) 800 MG TABLET     Take 800 mg by mouth 3 (three) times daily.  Modified Medications   Modified Medication Previous Medication   AMPHETAMINE-DEXTROAMPHETAMINE (ADDERALL) 15 MG TABLET amphetamine-dextroamphetamine (ADDERALL) 10 MG tablet      Take 1 tablet by mouth 2 (two) times daily with a meal.    Take 1 tablet (10 mg total) by mouth 2 (two) times daily with a meal.  Discontinued Medications   No medications on file

## 2014-08-03 NOTE — Progress Notes (Signed)
Pre visit review using our clinic review tool, if applicable. No additional management support is needed unless otherwise documented below in the visit note. 

## 2014-08-29 ENCOUNTER — Encounter: Payer: Self-pay | Admitting: Family Medicine

## 2014-08-29 ENCOUNTER — Ambulatory Visit (INDEPENDENT_AMBULATORY_CARE_PROVIDER_SITE_OTHER): Payer: BLUE CROSS/BLUE SHIELD | Admitting: Family Medicine

## 2014-08-29 ENCOUNTER — Other Ambulatory Visit: Payer: Self-pay | Admitting: Family Medicine

## 2014-08-29 VITALS — BP 106/80 | HR 62 | Temp 97.9°F | Ht 68.0 in | Wt 169.2 lb

## 2014-08-29 DIAGNOSIS — G4700 Insomnia, unspecified: Secondary | ICD-10-CM

## 2014-08-29 DIAGNOSIS — F900 Attention-deficit hyperactivity disorder, predominantly inattentive type: Secondary | ICD-10-CM | POA: Diagnosis not present

## 2014-08-29 MED ORDER — ZOLPIDEM TARTRATE 10 MG PO TABS: 10.0000 mg | ORAL_TABLET | Freq: Every evening | ORAL | Status: DC | PRN

## 2014-08-29 MED ORDER — VARENICLINE TARTRATE 1 MG PO TABS: 1.0000 mg | ORAL_TABLET | Freq: Two times a day (BID) | ORAL | Status: DC

## 2014-08-29 MED ORDER — AMPHETAMINE-DEXTROAMPHETAMINE 15 MG PO TABS: 15.0000 mg | ORAL_TABLET | Freq: Two times a day (BID) | ORAL | Status: DC

## 2014-08-29 MED ORDER — VARENICLINE TARTRATE 0.5 MG X 11 & 1 MG X 42 PO MISC: ORAL | Status: DC

## 2014-08-29 NOTE — Telephone Encounter (Signed)
Last office visit today 08/29/2014.  Celebrex not on current medication list.  Refill?

## 2014-08-29 NOTE — Progress Notes (Signed)
Pre visit review using our clinic review tool, if applicable. No additional management support is needed unless otherwise documented below in the visit note. 

## 2014-08-29 NOTE — Progress Notes (Signed)
Dr. Frederico Hamman T. Joanann Mies, MD, [facility] [place], [address] Phone: [phone] Fax: [phone]  08/29/2014  Patient: Raymond [name], MRN: [id number], DOB: 01-15-1976, 39 y.o.  Primary Physician:  [name], MD  Chief Complaint: Follow-up and Insomnia  Subjective:   Raymond [name] is a 39 y.o. very pleasant male patient who presents with the following:  F/u probable ulcer and ADHD:  Little bit of pain in the upper epigastric region. Overall, better  Concentration is better on 15 mg. Adderrall  A little insomnia - not new, not associated with adderrall  08/03/2014 Last OV with [name], MD  ADHD - still having issues concentrating. Improved with Adderrall 10 mg BID, but still having some issues.  Wt Readings from Last 3 Encounters:  08/29/14 169 lb 4 oz (76.771 kg)  08/03/14 176 lb 4 oz (79.946 kg)  05/18/14 170 lb 12 oz (77.452 kg)    Having a bad nerve pain. From breathing in chest - referred to shoulder.  Stll happens Eating will make it.  He is also having epigastric pain.  Past Medical History, Surgical History, Social History, Family History, Problem List, Medications, and Allergies have been reviewed and updated if relevant.  Patient Active Problem List   Diagnosis Date Noted  . 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 Hypertension 04/06/2013  . Allergic rhinitis 01/18/2013  . Family history of long QT syndrome 06/12/2012  . 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 Smoker 06/12/2012  . Common migraine 05/26/2012  . Neck pain 05/26/2012  . Back pain 05/26/2012  . Fibromyalgia 05/26/2012    Past Medical History  Diagnosis Date  . History of gastric ulcer     summer 2015  . Hypertension     has not been taking his medication; advised to start back on med. today  . 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 Shoulder  impingement 01/2014    right  . Biceps tendonitis on right 01/2014    Past Surgical History  Procedure Laterality Date  . Cholecystectomy    . Shoulder arthroscopy with subacromial decompression Right 02/21/2014    Procedure: SHOULDER ARTHROSCOPY WITH SUBACROMIAL DECOMPRESSION, DISTAL CLAVICAL EXCISION, DEBRIDIMENT OF PARTIAL ROTATOR CUFF TEAR;  Surgeon: Nita Sells, MD;  Location: [facility];  Service: Orthopedics;  Laterality: Right;  Right shoulder arthroscopy subacromial decompression, distal clavical excision  . Resection distal clavical Right 02/21/2014    Procedure: RESECTION DISTAL CLAVICAL;  Surgeon: Nita Sells, MD;  Location: [facility];  Service: Orthopedics;  Laterality: Right;    History   Social History  . Marital Status: Married    Spouse Name: N/A  . Number of Children: N/A  . Years of Education: N/A   Occupational History  . electrician    Social History Main Topics  . Smoking status: Former Smoker    Quit date: 09/24/2013  . Smokeless tobacco: Never Used  . Alcohol Use: No  . Drug Use: No  . Sexual Activity: Not on file   Other Topics Concern  . Not on file   Social History Narrative   Mom was in hospital with long QT syndrome, then heart in A Fib.   Sister has long QT syndrome   1st cousin: long QT syndrome          Family History  Problem Relation Age of Onset  . Lung disease Father   .  Diabetes Father   . Diabetes Mother   . Heart disease Mother     Allergies  Allergen Reactions  . Azithromycin Nausea And Vomiting  . Percocet [Oxycodone-Acetaminophen] Hives  . Vicodin [Hydrocodone-Acetaminophen] Hives  . Adhesive [Tape] Other (See Comments)    SKIN IRRITATION    Medication list reviewed and updated in full in [place].  GEN: no acute illness or fever CV: No chest pain or shortness of breath MSK: detailed above Neuro: neurological signs are described above ROS O/w per  HPI  Objective:   BP 106/80 mmHg  Pulse 62  Temp(Src) 97.9 F (36.6 C) (Oral)  Ht 5\' 8"  (1.727 m)  Wt 169 lb 4 oz (76.771 kg)  BMI 25.74 kg/m2  GEN: WDWN, NAD, Non-toxic, A & O x 3 HEENT: Atraumatic, Normocephalic. Neck supple. No masses, No LAD. Ears and Nose: No external deformity. CV: RRR, No M/G/R. No JVD. No thrill. No extra heart sounds. PULM: CTA B, no wheezes, crackles, rhonchi. No retractions. No resp. distress. No accessory muscle use. ABD: S, epigastric tenderness, ND, +BS. No rebound. No HSM. EXTR: No c/c/e NEURO Normal gait.  PSYCH: Normally interactive. Conversant. Not depressed or anxious appearing.  Calm demeanor.    Radiology: Results for orders placed or performed in visit on 08/03/14  Lipase  Result Value Ref Range   Lipase 22.0 11.0 - 59.0 U/L  H. pylori antibody, IgG  Result Value Ref Range   H Pylori IgG Negative Negative     Assessment and Plan:   Attention deficit hyperactivity disorder (ADHD), predominantly inattentive type  Insomnia  ADD meds the same Restarted smoking ambien prn  Follow-up: No Follow-up on file.  New Prescriptions   CELECOXIB (CELEBREX) 200 MG CAPSULE    TAKE 1 CAPSULE BY MOUTH DAILY.   VARENICLINE (CHANTIX STARTING MONTH PAK) 0.5 MG X 11 & 1 MG X 42 TABLET    Take one 0.5mg  tablet PO daily for 3 days, then increase to one 0.5mg  tablet BID for 3 days, then increase to one 1mg  tab BID.   VARENICLINE (CHANTIX) 1 MG TABLET    Take 1 tablet (1 mg total) by mouth 2 (two) times daily. refills   ZOLPIDEM (AMBIEN) 10 MG TABLET    Take 1 tablet (10 mg total) by mouth at bedtime as needed for sleep.   No orders of the defined types were placed in this encounter.    Signed,  Maud Deed. Arwin Bisceglia, MD   Patient's Medications  New Prescriptions   CELECOXIB (CELEBREX) 200 MG CAPSULE    TAKE 1 CAPSULE BY MOUTH DAILY.   VARENICLINE (CHANTIX STARTING MONTH PAK) 0.5 MG X 11 & 1 MG X 42 TABLET    Take one 0.5mg  tablet PO daily for 3  days, then increase to one 0.5mg  tablet BID for 3 days, then increase to one 1mg  tab BID.   VARENICLINE (CHANTIX) 1 MG TABLET    Take 1 tablet (1 mg total) by mouth 2 (two) times daily. refills   ZOLPIDEM (AMBIEN) 10 MG TABLET    Take 1 tablet (10 mg total) by mouth at bedtime as needed for sleep.  Previous Medications   CETIRIZINE-PSEUDOEPHEDRINE (ZYRTEC-D) 5-120 MG PER TABLET    Take 1 tablet by mouth 2 (two) times daily.   CHOLECALCIFEROL (VITAMIN D PO)    Take 5,000 mg by mouth daily.   CYCLOBENZAPRINE (FLEXERIL) 5 MG TABLET    Take 5 mg by mouth daily as needed.    FLUTICASONE (  FLONASE) 50 MCG/ACT NASAL SPRAY    USE 2 SPRAYS IN EACH NOSTRIL EVERY DAY   HYDROCHLOROTHIAZIDE (MICROZIDE) 12.5 MG CAPSULE    TAKE ONE CAPSULE BY MOUTH EVERY DAY   IBUPROFEN (ADVIL,MOTRIN) 800 MG TABLET    Take 800 mg by mouth 3 (three) times daily.   OMEPRAZOLE (PRILOSEC) 20 MG CAPSULE    Take 1 capsule (20 mg total) by mouth 2 (two) times daily before a meal.  Modified Medications   Modified Medication Previous Medication   AMPHETAMINE-DEXTROAMPHETAMINE (ADDERALL) 15 MG TABLET amphetamine-dextroamphetamine (ADDERALL) 15 MG tablet      Take 1 tablet by mouth 2 (two) times daily with a meal.    Take 1 tablet by mouth 2 (two) times daily with a meal.  Discontinued Medications   No medications on file

## 2014-09-17 ENCOUNTER — Encounter: Payer: Self-pay | Admitting: Family Medicine

## 2014-09-17 ENCOUNTER — Ambulatory Visit (INDEPENDENT_AMBULATORY_CARE_PROVIDER_SITE_OTHER): Payer: BLUE CROSS/BLUE SHIELD | Admitting: Family Medicine

## 2014-09-17 VITALS — BP 130/90 | HR 87 | Temp 97.4°F | Resp 20 | Ht 68.0 in | Wt 169.0 lb

## 2014-09-17 DIAGNOSIS — M25521 Pain in right elbow: Secondary | ICD-10-CM | POA: Diagnosis not present

## 2014-09-17 NOTE — Progress Notes (Signed)
HPI:  R arm pain: -started yesterday after hitting arm near elbow - immediately felt pain down forearm and into fingers and felt like 4th and 5th fingers were numb at time of injury now resolved -now better but still having pain at site of injury -denies: weakness, numbness, radiation today  Elevated BP: -better on recheck -reports "it is always high"  -on BP meds, but reports did not take tody  ROS: See pertinent positives and negatives per HPI.  Past Medical History  Diagnosis Date  . History of gastric ulcer     summer 2015  . Hypertension     has not been taking his medication; advised to start back on med. today  . 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 Shoulder impingement 01/2014    right  . Biceps tendonitis on right 01/2014    Past Surgical History  Procedure Laterality Date  . Cholecystectomy    . Shoulder arthroscopy with subacromial decompression Right 02/21/2014    Procedure: SHOULDER ARTHROSCOPY WITH SUBACROMIAL DECOMPRESSION, DISTAL CLAVICAL EXCISION, DEBRIDIMENT OF PARTIAL ROTATOR CUFF TEAR;  Surgeon: Nita Sells, MD;  Location: [facility];  Service: Orthopedics;  Laterality: Right;  Right shoulder arthroscopy subacromial decompression, distal clavical excision  . Resection distal clavical Right 02/21/2014    Procedure: RESECTION DISTAL CLAVICAL;  Surgeon: Nita Sells, MD;  Location: [facility];  Service: Orthopedics;  Laterality: Right;    Family History  Problem Relation Age of Onset  . Lung disease Father   . Diabetes Father   . Diabetes Mother   . Heart disease Mother     History   Social History  . Marital Status: Married    Spouse Name: N/A  . Number of Children: N/A  . Years of Education: N/A   Occupational History  . electrician    Social History Main Topics  . Smoking status: Former Smoker    Quit date: 09/24/2013  . Smokeless tobacco: Never Used  . Alcohol Use: No  . Drug  Use: No  . Sexual Activity: Not on file   Other Topics Concern  . None   Social History Narrative   Mom was in hospital with long QT syndrome, then heart in A Fib.   Sister has long QT syndrome   1st cousin: long QT syndrome           Current outpatient prescriptions:  .  amphetamine-dextroamphetamine (ADDERALL) 15 MG tablet, Take 1 tablet by mouth 2 (two) times daily with a meal., Disp: 60 tablet, Rfl: 0 .  celecoxib (CELEBREX) 200 MG capsule, TAKE 1 CAPSULE BY MOUTH DAILY., Disp: 30 capsule, Rfl: 5 .  cetirizine-pseudoephedrine (ZYRTEC-D) 5-120 MG per tablet, Take 1 tablet by mouth 2 (two) times daily., Disp: , Rfl:  .  Cholecalciferol (VITAMIN D PO), Take 5,000 mg by mouth daily., Disp: , Rfl:  .  cyclobenzaprine (FLEXERIL) 5 MG tablet, Take 5 mg by mouth daily as needed. , Disp: , Rfl: 0 .  fluticasone (FLONASE) 50 MCG/ACT nasal spray, USE 2 SPRAYS IN EACH NOSTRIL EVERY DAY, Disp: 16 g, Rfl: 5 .  hydrochlorothiazide (MICROZIDE) 12.5 MG capsule, TAKE ONE CAPSULE BY MOUTH EVERY DAY, Disp: 30 capsule, Rfl: 5 .  omeprazole (PRILOSEC) 20 MG capsule, Take 1 capsule (20 mg total) by mouth 2 (two) times daily before a meal., Disp: 30 capsule, Rfl: 11 .  varenicline (CHANTIX STARTING MONTH PAK) 0.5 MG X 11 & 1 MG X 42 tablet,  Take one 0.5mg  tablet PO daily for 3 days, then increase to one 0.5mg  tablet BID for 3 days, then increase to one 1mg  tab BID., Disp: 53 tablet, Rfl: 0 .  varenicline (CHANTIX) 1 MG tablet, Take 1 tablet (1 mg total) by mouth 2 (two) times daily. refills, Disp: 60 tablet, Rfl: 3 .  zolpidem (AMBIEN) 10 MG tablet, Take 1 tablet (10 mg total) by mouth at bedtime as needed for sleep., Disp: 30 tablet, Rfl: 3  EXAM:  Filed Vitals:   09/17/14 0949  BP: 142/100  Pulse: 87  Temp: 97.4 F (36.3 C)  Resp: 20    Body mass index is 25.7 kg/(m^2).  GENERAL: vitals reviewed and listed above, alert, oriented, appears well hydrated and in no acute distress  HEENT:  atraumatic, conjunttiva clear, no obvious abnormalities on inspection of external nose and ears  NECK: no obvious masses on inspection  MS: moves all extremities without noticeable abnormality, smal abrasion of skin without signs of infection medial elbow, no effusion of elbow or surrounding area, TTP in this area, normal ROM of elbow, normal strength in allmotions of elbow, wrist, hand and fingers, normal sensation to light touch in UE bilat, normal distal pulses UEs bilat, normal cap refill  PSYCH: pleasant and cooperative, no obvious depression or anxiety  ASSESSMENT AND PLAN:  Discussed the following assessment and plan:  Right elbow pain  -suspect soft tissue and nerve contusion with most symptoms now resolving other then local pain -doubt fx, but given pain of epicondyle advise plain films, especially if persistent pain and he opted to follow up with PCP next week if pain persists -supportive care with OTC analgesics and follow up with PCP -for his BP, advised to take his medications daily and follow up with PCP -Patient advised to return or notify a doctor immediately if symptoms worsen or persist or new concerns arise.  Patient Instructions  Tylenol or aleve per instructions if needed for pain  Please follow up with Dr. Lorelei Pont next week if symptoms persist     Raymond [name] R.

## 2014-09-17 NOTE — Patient Instructions (Signed)
Tylenol or aleve per instructions if needed for pain  Please follow up with Dr. Lorelei Pont next week if symptoms persist

## 2014-09-17 NOTE — Progress Notes (Signed)
Pre visit review using our clinic review tool, if applicable. No additional management support is needed unless otherwise documented below in the visit note. 

## 2014-10-10 ENCOUNTER — Other Ambulatory Visit: Payer: Self-pay

## 2014-10-10 MED ORDER — AMPHETAMINE-DEXTROAMPHETAMINE 15 MG PO TABS: 15.0000 mg | ORAL_TABLET | Freq: Two times a day (BID) | ORAL | Status: DC

## 2014-10-10 NOTE — Telephone Encounter (Signed)
Pt left v/m requesting rx for Adderall. Call when ready for pick up. Pt last seen and rx last printed # 60 on 08/29/14 .

## 2014-10-11 NOTE — Telephone Encounter (Signed)
Kenney Houseman (wife) notified by telephone that Tytan's prescription is ready to be picked up at the front desk.

## 2014-10-14 ENCOUNTER — Other Ambulatory Visit (HOSPITAL_COMMUNITY): Payer: Self-pay | Admitting: Oral and Maxillofacial Surgery

## 2014-10-14 DIAGNOSIS — M26629 Arthralgia of temporomandibular joint, unspecified side: Secondary | ICD-10-CM

## 2014-10-18 ENCOUNTER — Ambulatory Visit (HOSPITAL_COMMUNITY): Admission: RE | Admit: 2014-10-18 | Payer: BLUE CROSS/BLUE SHIELD | Source: Ambulatory Visit

## 2014-10-25 ENCOUNTER — Telehealth: Payer: Self-pay

## 2014-10-25 MED ORDER — ESZOPICLONE 3 MG PO TABS: 3.0000 mg | ORAL_TABLET | Freq: Every day | ORAL | Status: DC

## 2014-10-25 NOTE — Telephone Encounter (Signed)
ambien 10 mg not working   Please call in Lunesta 3 mg, 1 po qhs prn insomnia. Take 30 min before sleep. #30, 3 refills

## 2014-10-25 NOTE — Telephone Encounter (Signed)
tonya pts wife(DPR signed) said Raymond [name] is not helping pt to sleep and request different med for sleep [facility]. Pt has difficulty going to sleep but once he is asleep he usually sleeps the rest of the night. Please advise.

## 2014-10-25 NOTE — Telephone Encounter (Signed)
Lunesta called into [facility] [address]  Kenney Houseman notified.

## 2014-10-25 NOTE — Addendum Note (Signed)
Addended by: Carter Kitten on: 10/25/2014 04:20 PM   Modules accepted: Orders, Medications

## 2014-10-26 ENCOUNTER — Ambulatory Visit (HOSPITAL_COMMUNITY): Payer: BLUE CROSS/BLUE SHIELD

## 2014-11-02 ENCOUNTER — Ambulatory Visit (HOSPITAL_COMMUNITY)
Admission: RE | Admit: 2014-11-02 | Discharge: 2014-11-02 | Disposition: A | Discharge status: Home / Self Care | Payer: BLUE CROSS/BLUE SHIELD | Source: Ambulatory Visit | Attending: Oral and Maxillofacial Surgery | Admitting: Oral and Maxillofacial Surgery

## 2014-11-02 ENCOUNTER — Other Ambulatory Visit (HOSPITAL_COMMUNITY): Payer: Self-pay | Admitting: Radiology

## 2014-11-02 ENCOUNTER — Other Ambulatory Visit (HOSPITAL_COMMUNITY): Payer: Self-pay | Admitting: Oral and Maxillofacial Surgery

## 2014-11-02 DIAGNOSIS — M2662 Arthralgia of temporomandibular joint: Secondary | ICD-10-CM | POA: Insufficient documentation

## 2014-11-02 DIAGNOSIS — M26629 Arthralgia of temporomandibular joint, unspecified side: Secondary | ICD-10-CM

## 2014-11-02 DIAGNOSIS — Z1389 Encounter for screening for other disorder: Secondary | ICD-10-CM | POA: Diagnosis present

## 2014-11-24 ENCOUNTER — Other Ambulatory Visit: Payer: Self-pay | Admitting: Family Medicine

## 2014-11-24 NOTE — Telephone Encounter (Signed)
BP checked at each OV.

## 2014-11-24 NOTE — Telephone Encounter (Signed)
Last office visit 08/29/2014.  Last appt that addressed HTN 04/05/2013.  Last CPE???  Ok to refill?

## 2014-11-29 ENCOUNTER — Other Ambulatory Visit: Payer: Self-pay

## 2014-11-29 NOTE — Telephone Encounter (Signed)
V/M leftby pts wife [place] (DPR signed) requesting rx adderall. Call when ready for pick up. rx last printed # 60 on 10/10/14. Last seen 08/29/14.

## 2014-11-30 MED ORDER — AMPHETAMINE-DEXTROAMPHETAMINE 15 MG PO TABS: 15.0000 mg | ORAL_TABLET | Freq: Two times a day (BID) | ORAL | Status: DC

## 2014-11-30 NOTE — Telephone Encounter (Signed)
[name] notified Darrell's prescription is ready to be picked up at the front desk.

## 2015-01-10 ENCOUNTER — Encounter: Payer: Self-pay | Admitting: Family Medicine

## 2015-01-11 ENCOUNTER — Ambulatory Visit (INDEPENDENT_AMBULATORY_CARE_PROVIDER_SITE_OTHER): Payer: BLUE CROSS/BLUE SHIELD | Admitting: Family Medicine

## 2015-01-11 ENCOUNTER — Encounter: Payer: Self-pay | Admitting: Family Medicine

## 2015-01-11 VITALS — BP 118/80 | HR 77 | Temp 97.9°F | Ht 68.0 in | Wt 173.0 lb

## 2015-01-11 DIAGNOSIS — M259 Joint disorder, unspecified: Secondary | ICD-10-CM

## 2015-01-11 DIAGNOSIS — M25511 Pain in right shoulder: Secondary | ICD-10-CM

## 2015-01-11 DIAGNOSIS — R195 Other fecal abnormalities: Secondary | ICD-10-CM

## 2015-01-11 DIAGNOSIS — M19019 Primary osteoarthritis, unspecified shoulder: Secondary | ICD-10-CM

## 2015-01-11 MED ORDER — METHYLPREDNISOLONE ACETATE 40 MG/ML IJ SUSP
20.0000 mg | Freq: Once | INTRAMUSCULAR | Status: AC
  Administered 2015-01-11: 20 mg via INTRAMUSCULAR

## 2015-01-11 MED ORDER — KETOPROFEN 75 MG PO CAPS: 75.0000 mg | ORAL_CAPSULE | Freq: Three times a day (TID) | ORAL | Status: DC

## 2015-01-11 NOTE — Progress Notes (Signed)
Dr. Frederico Hamman T. Annalyssa Thune, MD, [facility] [place], [address] Phone: [phone] Fax: [phone]  01/11/2015  Patient: Raymond [name], MRN: [id number], DOB: 08/31/1975, 39 y.o.  Primary Physician:  [name], MD   Chief Complaint  Patient presents with  . Shoulder Pain    Right  . Upset Stomach    since gallbladder surgery x 7 years ago   Subjective:   Raymond [name] is a 39 y.o. very pleasant male patient who presents with the following:  Almost 1 year s/p SAD/DCE. SLAP lesion. By Dr. Tamera Punt  He describes pain directly at the a.c. Joint and some pain when he is reaching across his body.  It is a dull ache basically directly at the a.c. Joint itself.  He is not having so much pain with abduction and internal range of motion.  This did improve after his subacromial decompression and distal clavicle excision.   The a.c. Joint symptoms seem to never really completely gone away.  He also is complaining of having some intermittent loose stools or been going on for about 7 years.  He did has gallbladder out, and since then he has had some loose stools at a been challenging for him.  He does eat somewhat of a fatty diet, but he tries to eat some fiber.  He doesn't take a supplement.  02/2014 Shoulder surgery Procedure(s) and Anesthesia Type:  #1 right shoulder arthroscopic debridement of partial-thickness rotator cuff tear, type I SLAP tear and extensive bursitis  #2 right shoulder arthroscopic subacromial decompression  #3 right shoulder arthroscopic distal clavicle excision  Past Medical History, Surgical History, Social History, Family History, Problem List, Medications, and Allergies have been reviewed and updated if relevant.  Patient Active Problem List   Diagnosis Date Noted  . 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 Hypertension 04/06/2013  .  Allergic rhinitis 01/18/2013  . Family history of long QT syndrome 06/12/2012  . 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 Smoker 06/12/2012  . Common migraine 05/26/2012  . Neck pain 05/26/2012  . Back pain 05/26/2012  . Fibromyalgia 05/26/2012    Past Medical History  Diagnosis Date  . History of gastric ulcer     summer 2015  . Hypertension     has not been taking his medication; advised to start back on med. today  . 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 Shoulder impingement 01/2014    right  . Biceps tendonitis on right 01/2014    Past Surgical History  Procedure Laterality Date  . Cholecystectomy    . Shoulder arthroscopy with subacromial decompression Right 02/21/2014    Procedure: SHOULDER ARTHROSCOPY WITH SUBACROMIAL DECOMPRESSION, DISTAL CLAVICAL EXCISION, DEBRIDIMENT OF PARTIAL ROTATOR CUFF TEAR;  Surgeon: Nita Sells, MD;  Location: [facility];  Service: Orthopedics;  Laterality: Right;  Right shoulder arthroscopy subacromial decompression, distal clavical excision  . Resection distal clavical Right 02/21/2014    Procedure: RESECTION DISTAL CLAVICAL;  Surgeon: Nita Sells, MD;  Location: [facility];  Service: Orthopedics;  Laterality: Right;    Social History   Social History  . Marital Status: Married    Spouse Name: N/A  . Number of Children: N/A  . Years of Education: N/A   Occupational History  . electrician    Social History Main Topics  . Smoking status: Former Smoker    Quit date: 09/24/2013  .  Smokeless tobacco: Never Used  . Alcohol Use: No  . Drug Use: No  . Sexual Activity: Not on file   Other Topics Concern  . Not on file   Social History Narrative   Mom was in hospital with long QT syndrome, then heart in A Fib.   Sister has long QT syndrome   1st cousin: long QT syndrome          Family History  Problem Relation Age of Onset  . Lung disease Father   . Diabetes Father   .  Diabetes Mother   . Heart disease Mother     Allergies  Allergen Reactions  . Azithromycin Nausea And Vomiting  . Percocet [Oxycodone-Acetaminophen] Hives  . Vicodin [Hydrocodone-Acetaminophen] Hives  . Adhesive [Tape] Other (See Comments)    SKIN IRRITATION    Medication list reviewed and updated in full in [place].  GEN: No fevers, chills. Nontoxic. Primarily MSK c/o today. MSK: Detailed in the HPI GI: tolerating PO intake without difficulty Neuro: No numbness, parasthesias, or tingling associated. Otherwise the pertinent positives of the ROS are noted above.   Objective:   BP 118/80 mmHg  Pulse 77  Temp(Src) 97.9 F (36.6 C) (Oral)  Ht 5\' 8"  (1.727 m)  Wt 173 lb (78.472 kg)  BMI 26.31 kg/m2   GEN: WDWN, NAD, Non-toxic, Alert & Oriented x 3 HEENT: Atraumatic, Normocephalic.  Ears and Nose: No external deformity. EXTR: No clubbing/cyanosis/edema ABD: S, NT, ND, + BS, No rebound, No HSM  NEURO: Normal gait.  PSYCH: Normally interactive. Conversant. Not depressed or anxious appearing.  Calm demeanor.   Shoulder: R Inspection: No muscle wasting or winging Ecchymosis/edema: neg  AC joint, scapula, clavicle: TTP directly at the [facility] joint Cervical spine: NT, full ROM Spurling's: neg Abduction: full, 5/5 Flexion: full, 5/5 IR, full, lift-off: 5/5 ER at neutral: full, 5/5 AC crossover and compression: pos with crossover relatively mildly Neer: neg Hawkins: neg Drop Test: neg Empty Can: neg Supraspinatus insertion: NT Bicipital groove: NT Speed's: neg Yergason's: neg Sulcus sign: neg Scapular dyskinesis: none C5-T1 intact Sensation intact Grip 5/5   Radiology: No results found.  Assessment and Plan:   AC joint arthropathy - Plan: methylPREDNISolone acetate (DEPO-MEDROL) injection 20 mg  Right shoulder pain  Loose stools  Primary pain is at the a.c. Joint at this point, despite having had a subacromial decompression and distal clavicle  excision.  Actual cough symptoms seem to be quite improved compared to when I last examined his shoulder.  We are going to try to do an a.c. Joint injection to see if this calms down his symptoms.  Continue to be active.  Does do some boxing.  Loose stools, this may be post gallbladder surgery, and I recommended he start adding some fiber supplementation into his diet to help on the stool.  He was given several recommendations.  Refer to the patient instructions sections for details of plan shared with patient.   AC Joint Injection, R Verbal consent was obtained from the patient. Risks, benefits, and alternatives have been reviewed including possible infection. The patient was prepped with Chloraprep. Ethyl Chloride used for anesthesia. Under sterile conditions, 1/2 cc of Lidocaine 1% and Depo-Medrol 20 mg directly injected into at the superior-lateral AC joint. The patient tolerated the procedure well and had decreased symptoms after injection. No complications.    Follow-up: prn  Modified Medications   Modified Medication Previous Medication   KETOPROFEN (ORUDIS) 75 MG CAPSULE ketoprofen (ORUDIS) 75  MG capsule      Take 1 capsule (75 mg total) by mouth 3 (three) times daily.    Take 75 mg by mouth 3 (three) times daily.   Patient Instructions  Benefiber or Citrucel supplement every day. (Metamucil)  Oatmeal is good in the morning.   Stop the Celebrex     Signed,  Sevyn Markham T. Palma Buster, MD   Patient's Medications  New Prescriptions   No medications on file  Previous Medications   AMPHETAMINE-DEXTROAMPHETAMINE (ADDERALL) 15 MG TABLET    Take 1 tablet by mouth 2 (two) times daily with a meal.   CETIRIZINE-PSEUDOEPHEDRINE (ZYRTEC-D) 5-120 MG PER TABLET    Take 1 tablet by mouth 2 (two) times daily.   CHOLECALCIFEROL (VITAMIN D PO)    Take 5,000 mg by mouth daily.   CYCLOBENZAPRINE (FLEXERIL) 5 MG TABLET    Take 5 mg by mouth daily as needed.    ESZOPICLONE 3 MG TABS    Take 3 mg by  mouth at bedtime as needed. Take 30 minutes before sleep   FLUTICASONE (FLONASE) 50 MCG/ACT NASAL SPRAY    USE 2 SPRAYS IN EACH NOSTRIL EVERY DAY   HYDROCHLOROTHIAZIDE (MICROZIDE) 12.5 MG CAPSULE    TAKE ONE CAPSULE BY MOUTH EVERY DAY   OMEPRAZOLE (PRILOSEC) 20 MG CAPSULE    Take 1 capsule (20 mg total) by mouth 2 (two) times daily before a meal.   VARENICLINE (CHANTIX STARTING MONTH PAK) 0.5 MG X 11 & 1 MG X 42 TABLET    Take one 0.5mg  tablet PO daily for 3 days, then increase to one 0.5mg  tablet BID for 3 days, then increase to one 1mg  tab BID.   VARENICLINE (CHANTIX) 1 MG TABLET    Take 1 tablet (1 mg total) by mouth 2 (two) times daily. refills  Modified Medications   Modified Medication Previous Medication   KETOPROFEN (ORUDIS) 75 MG CAPSULE ketoprofen (ORUDIS) 75 MG capsule      Take 1 capsule (75 mg total) by mouth 3 (three) times daily.    Take 75 mg by mouth 3 (three) times daily.  Discontinued Medications   CELECOXIB (CELEBREX) 200 MG CAPSULE    TAKE 1 CAPSULE BY MOUTH DAILY.

## 2015-01-11 NOTE — Patient Instructions (Addendum)
Benefiber or Citrucel supplement every day. (Metamucil)  Oatmeal is good in the morning.   Stop the Celebrex

## 2015-01-11 NOTE — Progress Notes (Signed)
Pre visit review using our clinic review tool, if applicable. No additional management support is needed unless otherwise documented below in the visit note. 

## 2015-01-21 ENCOUNTER — Other Ambulatory Visit: Payer: Self-pay | Admitting: Family Medicine

## 2015-02-06 ENCOUNTER — Other Ambulatory Visit: Payer: Self-pay | Admitting: Family Medicine

## 2015-02-06 ENCOUNTER — Encounter: Payer: Self-pay | Admitting: Family Medicine

## 2015-02-06 ENCOUNTER — Telehealth: Payer: Self-pay

## 2015-02-06 DIAGNOSIS — G8929 Other chronic pain: Secondary | ICD-10-CM

## 2015-02-06 DIAGNOSIS — M542 Cervicalgia: Principal | ICD-10-CM

## 2015-02-06 MED ORDER — TRAMADOL HCL 50 MG PO TABS: 50.0000 mg | ORAL_TABLET | Freq: Four times a day (QID) | ORAL | Status: DC | PRN

## 2015-02-06 NOTE — Telephone Encounter (Signed)
Please confirm:   I recently saw him with shoulder pain. Does he want to see Dr. Leeroy Cha the neurosurgeon?? 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   Or do they mean that he wants to see Dr. Tamera Punt the shoulder surgeon who did his shoulder surgeon?  Ok to refill tramadol 50 mg, 1 po q 6 hours prn pain, #50, 2 ref

## 2015-02-06 NOTE — Telephone Encounter (Signed)
Tanya pts wife(DPR signed) request referral for neck pain and shoulder pain to Dr Dory Larsen in [facility]; pt has seen Dr Dory Larsen previously but referral had expired; does not want to come in for appt at [facility].pt also request refill tramadol. Last refilled by Grier Mitts PA # [phone] on 02/21/14.[facility].pt last seen shoulder pain 01/11/15.

## 2015-02-06 NOTE — Telephone Encounter (Signed)
Spoke with Raymond [name].  They need a referral to go see Dr. Joya Salm to have him look at Raymond [name]'s C-2 in his neck.

## 2015-02-06 NOTE — Telephone Encounter (Signed)
done

## 2015-02-06 NOTE — Telephone Encounter (Signed)
Tramadol called into [facility]  Left message for Lennette Bihari or Lavella Lemons to return my call.  Dr. Joya Salm is a spine surgeon and will not see [facility] for his shoulder.  Did they mean Dr. Tamera Punt who did Gedeon's shoulder surgery??  Also advised that Tramadol has been called into [facility]

## 2015-03-17 ENCOUNTER — Other Ambulatory Visit: Payer: Self-pay | Admitting: Family Medicine

## 2015-03-17 NOTE — Telephone Encounter (Signed)
Last office visit 01/11/2015.  Last refilled 02/06/2015 for #50 with 2 refills.  Ok to refill?

## 2015-03-17 NOTE — Telephone Encounter (Signed)
Tramadol called into [facility] 

## 2015-03-17 NOTE — Telephone Encounter (Signed)
Ok, 50, 3 ref 

## 2015-06-07 ENCOUNTER — Other Ambulatory Visit: Payer: Self-pay | Admitting: Family Medicine

## 2015-06-07 NOTE — Telephone Encounter (Signed)
Ok to refill #30, 3 refills

## 2015-06-07 NOTE — Telephone Encounter (Signed)
Last office visit 01/11/2015.  Ok to refill?

## 2015-06-07 NOTE — Telephone Encounter (Signed)
Lunesta called into [facility]

## 2015-06-09 ENCOUNTER — Other Ambulatory Visit: Payer: Self-pay

## 2015-06-09 NOTE — Telephone Encounter (Signed)
Tanya left v/m(DPR signed) requesting rx adderall. Call when ready for pick up. Last printed # 60 on 11/30/14; last seen ADHD on 08/29/14.

## 2015-06-12 MED ORDER — AMPHETAMINE-DEXTROAMPHETAMINE 15 MG PO TABS: 15.0000 mg | ORAL_TABLET | Freq: Two times a day (BID) | ORAL | Status: DC

## 2015-06-12 NOTE — Telephone Encounter (Signed)
[name] notified Akia's prescription is ready to be picked up at the front desk. 

## 2015-06-19 DIAGNOSIS — J029 Acute pharyngitis, unspecified: Secondary | ICD-10-CM | POA: Diagnosis not present

## 2015-06-19 DIAGNOSIS — R0982 Postnasal drip: Secondary | ICD-10-CM | POA: Diagnosis not present

## 2015-09-02 ENCOUNTER — Other Ambulatory Visit: Payer: Self-pay | Admitting: Family Medicine

## 2015-09-04 ENCOUNTER — Telehealth: Payer: Self-pay

## 2015-09-04 NOTE — Telephone Encounter (Signed)
Pt request status of orudis refill; advised done already and pt will ck with pharmacy.

## 2015-09-04 NOTE — Telephone Encounter (Signed)
Pt wife called to see if adderall rx is still at front desk for pick up since 05/2015. rx is still at front desk and tonya will pick up.

## 2015-10-26 ENCOUNTER — Other Ambulatory Visit: Payer: Self-pay | Admitting: Family Medicine

## 2015-10-26 NOTE — Telephone Encounter (Signed)
Last office visit 01/11/2015.  Last refilled 03/17/2015 for #50 with 3 refills. Ok to refill?

## 2015-10-27 NOTE — Telephone Encounter (Signed)
Ok to refill 50, 3 ref

## 2015-10-27 NOTE — Telephone Encounter (Signed)
Tramadol called into [facility] 

## 2015-12-12 ENCOUNTER — Other Ambulatory Visit: Payer: Self-pay

## 2015-12-12 NOTE — Telephone Encounter (Signed)
Pt's wife Kenney Houseman (DPR signed) left v/m requesting rx for Adderall. Call when ready for pick up. rx last printed #60 on 06/12/15; last seen ADHD on 08/29/14; last acute visit on 01/11/15. No future appt scheduled.

## 2015-12-13 MED ORDER — AMPHETAMINE-DEXTROAMPHETAMINE 15 MG PO TABS
15.0000 mg | ORAL_TABLET | Freq: Two times a day (BID) | ORAL | 0 refills | Status: DC
Start: 2015-12-13 — End: 2017-03-27

## 2015-12-13 NOTE — Telephone Encounter (Signed)
Left message for Raymond [name] that Lysle's prescription is ready to be picked up at the front desk.   Also advised that Bertis needs to schedule office visit for CPE with Dr. Lorelei Pont prior to next refill.

## 2016-03-06 ENCOUNTER — Ambulatory Visit: Payer: Self-pay | Admitting: Family Medicine

## 2016-03-11 ENCOUNTER — Ambulatory Visit (INDEPENDENT_AMBULATORY_CARE_PROVIDER_SITE_OTHER): Payer: BLUE CROSS/BLUE SHIELD | Admitting: Family Medicine

## 2016-03-11 ENCOUNTER — Encounter: Payer: Self-pay | Admitting: Family Medicine

## 2016-03-11 VITALS — BP 118/74 | HR 57 | Temp 98.2°F | Ht 68.0 in | Wt 170.8 lb

## 2016-03-11 DIAGNOSIS — M5412 Radiculopathy, cervical region: Secondary | ICD-10-CM

## 2016-03-11 MED ORDER — PREDNISONE 20 MG PO TABS: 20.0000 mg | ORAL_TABLET | Freq: Every day | ORAL | 0 refills | Status: DC

## 2016-03-11 NOTE — Progress Notes (Signed)
Pre visit review using our clinic review tool, if applicable. No additional management support is needed unless otherwise documented below in the visit note. 

## 2016-03-11 NOTE — Patient Instructions (Signed)

## 2016-03-11 NOTE — Progress Notes (Signed)
Dr. Frederico Hamman T. Pao Haffey, MD, [facility] [place], [address] Phone: [phone] Fax: [phone]  03/11/2016  Patient: Raymond [name], MRN: [id number], DOB: 11-Oct-1975, 41 y.o.  Primary Physician:  [name], MD   Chief Complaint  Patient presents with  . Neck Pain    Radiates down both shoulders to fingers   Subjective:   Raymond [name] is a 41 y.o. very pleasant male patient who presents with the following:  Long-standing history of neck pain.  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  He previously has seen Dr. Joya Salm from neurosurgery, but I do not have any of those records available to me.  Within the last 3 months his neck is been gradually getting worse and worse, now he has bilateral radicular symptoms down both arms.  He has intermittent numbness  Down both arms.  He also has tingling.  There is no frank weakness.   He has not had any trauma.  Neck - 3 months, it has been getting worse and worse. Will feel radiculopathy down both arms. Had a series of ESI with Dr. Sharlet Salina.   Some numbness and tingling, weakness.   Past Medical History, Surgical History, Social History, Family History, Problem List, Medications, and Allergies have been reviewed and updated if relevant.  Patient Active Problem List   Diagnosis Date Noted  . 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 Hypertension 04/06/2013  . Allergic rhinitis 01/18/2013  . Family history of long QT syndrome 06/12/2012  . 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 Smoker 06/12/2012  . Common migraine 05/26/2012  . Neck pain 05/26/2012  . Back pain 05/26/2012  . Fibromyalgia 05/26/2012    Past Medical History:  Diagnosis Date    . Biceps tendonitis on right 01/2014  . History of gastric ulcer    summer 2015  . Hypertension    has not been taking his medication; advised to start back on med. today  . 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 Shoulder impingement 01/2014   right    Past Surgical History:  Procedure Laterality Date  . CHOLECYSTECTOMY    . RESECTION DISTAL CLAVICAL Right 02/21/2014   Procedure: RESECTION DISTAL CLAVICAL;  Surgeon: Nita Sells, MD;  Location: [facility];  Service: Orthopedics;  Laterality: Right;  . SHOULDER ARTHROSCOPY WITH SUBACROMIAL DECOMPRESSION Right 02/21/2014   Procedure: SHOULDER ARTHROSCOPY WITH SUBACROMIAL DECOMPRESSION, DISTAL CLAVICAL EXCISION, DEBRIDIMENT OF PARTIAL ROTATOR CUFF TEAR;  Surgeon: Nita Sells, MD;  Location: [facility];  Service: Orthopedics;  Laterality: Right;  Right shoulder arthroscopy subacromial decompression, distal clavical excision    Social History   Social History  . Marital status: Married    Spouse name: N/A  . Number of children: N/A  . Years of education: N/A   Occupational History  . electrician Programme researcher, broadcasting/film/video   Social History Main Topics  . Smoking status: Former Smoker    Quit date: 09/24/2013  . Smokeless tobacco: Never Used  . Alcohol use No  . Drug use: No  . Sexual activity: Not on file   Other Topics Concern  . Not on file   Social History Narrative   Mom was in hospital with long QT syndrome, then  heart in A Fib.   Sister has long QT syndrome   1st cousin: long QT syndrome          Family History  Problem Relation Age of Onset  . Lung disease Father   . Diabetes Father   . Diabetes Mother   . Heart disease Mother     Allergies  Allergen Reactions  . Azithromycin Nausea And Vomiting  . Percocet [Oxycodone-Acetaminophen] Hives  . Vicodin [Hydrocodone-Acetaminophen] Hives  . Adhesive [Tape] Other (See Comments)    SKIN IRRITATION    Medication  list reviewed and updated in full in [facility].  GEN: No fevers, chills. Nontoxic. Primarily MSK c/o today. MSK: Detailed in the HPI GI: tolerating PO intake without difficulty Neuro: No numbness, parasthesias, or tingling associated. Otherwise the pertinent positives of the ROS are noted above.   Objective:   BP 118/74   Pulse (!) 57   Temp 98.2 F (36.8 C) (Oral)   Ht 5\' 8"  (1.727 m)   Wt 170 lb 12 oz (77.5 kg)   BMI 25.96 kg/m    GEN: Well-developed,well-nourished,in no acute distress; alert,appropriate and cooperative throughout examination HEENT: Normocephalic and atraumatic without obvious abnormalities. Ears, externally no deformities PULM: Breathing comfortably in no respiratory distress EXT: No clubbing, cyanosis, or edema PSYCH: Normally interactive. Cooperative during the interview. Pleasant. Friendly and conversant. Not anxious or depressed appearing. Normal, full affect.  CERVICAL SPINE EXAM Range of motion: Flexion, extension, lateral bending, and rotation: approximate 10% loss of flexion.  40% loss of extension.  40% loss of lateral bending. Pain with terminal motion: he has Spinous Processes: NT SCM: NT Upper paracervical muscles: ttp Upper traps: mild ttp C5-T1 intact, motor function is entirely intact.  Patient does have a nonfocal pattern of some decreased sensation in both arms, more on the underside of the upper extremity and forearm.  Down to the bottom to fingers.  Radiology: CLINICAL DATA:  Pain.  EXAM: CERVICAL SPINE  4+ VIEWS  COMPARISON:  None.  FINDINGS: Loss of normal cervical lordosis noted. No evidence of fracture or dislocation.Torticollis and/or ligamentous injury cannot be excluded. Neuroforamen are patent.  IMPRESSION: Loss of normal cervical lordosis. This could be related to positioning, torticollis, or ligamentous injury. No evidence of fracture or dislocation 2   Electronically Signed   By: Stuart    On: 06/23/2013 15:06  PRIOR REPORT IMPORTED FROM THE [facility] WORKFLOW SYSTEM   REASON FOR EXAM:    neck pain right arm numbness no response to nsaids  anagelsics PT or injections  COMMENTS:   PROCEDURE:     MR  - MR CERVICAL SPINE WO CONT  - Apr 25 2011  6:54PM   RESULT:     Comparison: None   Technique: Standard cervical spine protocol, without administration of IV  contrast.   Findings:  There is normal alignment.  Normal bone marrow signal.  Size and signal of  the spinal cord is within normal limits.  No cerebellar ectopia.   C2-C3: No significant disc bulge or neuroforaminal narrowing.   C3-C4: Minimal posterior disc bulge causes minimal effacement of ventral  CSF  space. No neuroforaminal narrowing.   C4-C5: Minimal posterior disc bulge causes minimal effacement of ventral  CSF  space. No neuroforaminal narrowing.   C5-C6: Minimal posterior disc bulge causes minimal effacement of ventral  CSF  space. There is mild left uncovertebral hypertrophy, without significant  neuroforaminal narrowing. Mild left uncovertebral hypertrophy causes mild  left neuroforaminal  narrowing.   C6-C7: Minimal posterior disc bulge causes minimal effacement of ventral  CSF  space. No neuroforaminal narrowing.   C7-T1: No significant disc bulge or neuroforaminal narrowing.   IMPRESSION:  Minimal multilevel posterior disc bulges. Mild left neuroforaminal  narrowing  at C5-C6.   Assessment and Plan:   Cervical radiculopathy - Plan: Ambulatory referral to Neurosurgery  Worsening bilateral radiculopathy with decreased sensation in both arms and hands.  I'm unclear about what workup has been done from his neurosurgeon, and I do not have any of these records at all.   Prior MRI of the cervical spine from 5 years ago was relatively unremarkable.  Failure of all neuropathic pain medication to relieve any symptoms.  I'm going to give him  Some steroids to take right now.  He relates  a history of having some joint swelling possibly from steroids previously orally, so I'm going to use a lower dose.  At this point, recommended follow-up with the patient's neurosurgeon.  Follow-up: No Follow-up on file.  Meds ordered this encounter  Medications  . predniSONE (DELTASONE) 20 MG tablet    Sig: Take 1 tablet (20 mg total) by mouth daily with breakfast.    Dispense:  8 tablet    Refill:  0   Medications Discontinued During This Encounter  Medication Reason  . cyclobenzaprine (FLEXERIL) 5 MG tablet Ineffective   Orders Placed This Encounter  Procedures  . Ambulatory referral to Neurosurgery    Signed,  Frederico Hamman T. Nikea Settle, MD   Allergies as of 03/11/2016      Reactions   Azithromycin Nausea And Vomiting   Percocet [oxycodone-acetaminophen] Hives   Vicodin [hydrocodone-acetaminophen] Hives   Adhesive [tape] Other (See Comments)   SKIN IRRITATION      Medication List       Accurate as of 03/11/16 11:59 PM. Always use your most recent med list.          amphetamine-dextroamphetamine 15 MG tablet Commonly known as:  ADDERALL Take 1 tablet by mouth 2 (two) times daily with a meal.   cetirizine-pseudoephedrine 5-120 MG tablet Commonly known as:  ZYRTEC-D Take 1 tablet by mouth 2 (two) times daily.   Eszopiclone 3 MG Tabs TAKE 1 TABLET BY MOUTH 30 MINUTES PRIOR TO BEDTIME   fluticasone 50 MCG/ACT nasal spray Commonly known as:  FLONASE USE 2 SPRAYS IN EACH NOSTRIL EVERY DAY   hydrochlorothiazide 12.5 MG capsule Commonly known as:  MICROZIDE TAKE ONE CAPSULE BY MOUTH EVERY DAY   ketoprofen 75 MG capsule Commonly known as:  ORUDIS TAKE 1 CAPSULE (75 MG TOTAL) BY MOUTH 3 (THREE) TIMES DAILY.   omeprazole 20 MG capsule Commonly known as:  PRILOSEC Take 1 capsule (20 mg total) by mouth 2 (two) times daily before a meal.   predniSONE 20 MG tablet Commonly known as:  DELTASONE Take 1 tablet (20 mg total) by mouth daily with breakfast.   traMADol  50 MG tablet Commonly known as:  ULTRAM TAKE 1 TABLET BY MOUTH EVERY 6 HOURS AS NEEDED FOR PAIN   varenicline 1 MG tablet Commonly known as:  CHANTIX Take 1 tablet (1 mg total) by mouth 2 (two) times daily. refills   varenicline 0.5 MG X 11 & 1 MG X 42 tablet Commonly known as:  CHANTIX STARTING MONTH PAK Take one 0.5mg  tablet PO daily for 3 days, then increase to one 0.5mg  tablet BID for 3 days, then increase to one 1mg  tab BID.   VITAMIN D PO Take  5,000 mg by mouth daily.

## 2016-03-16 DIAGNOSIS — M25511 Pain in right shoulder: Secondary | ICD-10-CM | POA: Diagnosis not present

## 2016-03-20 ENCOUNTER — Other Ambulatory Visit: Payer: Self-pay | Admitting: Family Medicine

## 2016-03-20 NOTE — Telephone Encounter (Signed)
Last office visit 03/11/2016.  Last refilled 09/03/2015 for #90 with 2 refills.  Ok to refill?

## 2016-03-21 ENCOUNTER — Encounter: Payer: Self-pay | Admitting: Physical Therapy

## 2016-03-21 ENCOUNTER — Ambulatory Visit: Payer: BLUE CROSS/BLUE SHIELD | Attending: Orthopedic Surgery | Admitting: Physical Therapy

## 2016-03-21 DIAGNOSIS — M542 Cervicalgia: Secondary | ICD-10-CM | POA: Diagnosis not present

## 2016-03-21 DIAGNOSIS — G8929 Other chronic pain: Secondary | ICD-10-CM | POA: Insufficient documentation

## 2016-03-21 DIAGNOSIS — M5412 Radiculopathy, cervical region: Secondary | ICD-10-CM | POA: Insufficient documentation

## 2016-03-21 DIAGNOSIS — M25512 Pain in left shoulder: Secondary | ICD-10-CM | POA: Insufficient documentation

## 2016-03-21 DIAGNOSIS — M25511 Pain in right shoulder: Secondary | ICD-10-CM | POA: Diagnosis not present

## 2016-03-21 NOTE — Therapy (Signed)
[facility] [address], [place], [address] Phone: [phone]   Fax:  [phone]  Physical Therapy Evaluation  Patient Details  Name: Raymond [name] MRN: [id number] Date of Birth: 12/19/1975 Referring Provider: Isabella Stalling, MD  Encounter Date: 03/21/2016      PT End of Session - 03/21/16 1059    Visit Number 1   Number of Visits 9   Date for PT Re-Evaluation 04/18/16   PT Start Time 0915   PT Stop Time [id number]   PT Time Calculation (min) 53 min   Activity Tolerance Patient tolerated treatment well;No increased pain   Behavior During Therapy WFL for tasks assessed/performed      Past Medical History:  Diagnosis Date  . Biceps tendonitis on right 01/2014  . History of gastric ulcer    summer 2015  . Hypertension    has not been taking his medication; advised to start back on med. today  . 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 Shoulder impingement 01/2014   right    Past Surgical History:  Procedure Laterality Date  . CHOLECYSTECTOMY    . RESECTION DISTAL CLAVICAL Right 02/21/2014   Procedure: RESECTION DISTAL CLAVICAL;  Surgeon: Nita Sells, MD;  Location: [facility];  Service: Orthopedics;  Laterality: Right;  . SHOULDER ARTHROSCOPY WITH SUBACROMIAL DECOMPRESSION Right 02/21/2014   Procedure: SHOULDER ARTHROSCOPY WITH SUBACROMIAL DECOMPRESSION, DISTAL CLAVICAL EXCISION, DEBRIDIMENT OF PARTIAL ROTATOR CUFF TEAR;  Surgeon: Nita Sells, MD;  Location: [facility];  Service: Orthopedics;  Laterality: Right;  Right shoulder arthroscopy subacromial decompression, distal clavical excision    There were no vitals filed for this visit.       Subjective Assessment - 03/21/16 1031    Subjective Pt presents to clinic with a history of bilateraly shoulder pain since 2012 when he was heavily involved in martial arts. He states that today his L  shoulder is "tight" him and that it is radiating into his scapula. He states that currently his pain is a 3/10 which is "about as good as it ever gets." He states that the only thing that alleviates his pain is lidocaine cream and ocassionally ice will help. Pt states that he is unable to work a couple days a week due to pain and that his suregon has suggested performing bilateral surgery on both shoulders to "move his biceps tendon" however he wants to exhaust all options prior to suregery. He is currently being seen by a chiropractor and has once a week for the past 6 years however he states that it only helps for a few hours before the pain return.   Pertinent History R shoulder SAD January 2016   Patient Stated Goals Be able to work pain free and return to martial arts   Currently in Pain? Yes   Pain Score 3    Pain Location Shoulder   Pain Orientation Right;Left   Pain Descriptors / Indicators Aching;Burning   Pain Type Chronic pain   Pain Radiating Towards Radiating patterns are unpredictable and vary regularly however does travel down both arms into hand   Pain Onset Other (comment)   Pain Frequency Occasional   Aggravating Factors  Work, over head or at shoulder level mostly   Pain Relieving Factors lidocaine cream   Effect of Pain on Daily Activities once or twice a week unable to work due to pain  [facility] PT Assessment - 03/21/16 0001      Assessment   Medical Diagnosis R shoulder trap/periscap pain   Referring Provider Isabella Stalling, MD   Onset Date/Surgical Date 02/21/14   Hand Dominance Right   Prior Therapy Post surgery on R shoulder, Seeing chiropractor once a week for past 5 years but states that it does not help     Precautions   Precautions None     Balance Screen   Has the patient fallen in the past 6 months No   Has the patient had a decrease in activity level because of a fear of falling?  Yes   Is the patient reluctant to leave their home because  of a fear of falling?  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 Resting posture with bilateral scapula protraction. Pt appears to be using accessory muscles to breath, lower ribs not flairing with inspiration, shoulder elevation on inspiration     Sensation   Additional Comments Decreased sensation of L anterior/posterior neck and supraclavicular fossa compared bilaterally. Pt states that it is because that his L side is irritated today and that when his R side is irritated it has decreased sensation. All other aspects of upper quarter screen WNL bilaterally including DTR and myotomes     Tone   Assessment Location Other (comment)     Tone Assessment - Other   Other Tone Location Hypertonic B upper traps and L parascapular musculature.     ROM / Strength   AROM / PROM / Strength AROM;Strength     AROM   Overall AROM Comments Shoulder AROM WNL bilaterally. Cervical ROM WNL all directions, L lateral flexion reproduced pain in L upper trap radiating into inferior scapula     Strength   Overall Strength Within functional limits for tasks performed   Overall Strength Comments Pt strong in all directions of B shoulders     Palpation   Palpation comment Hypertonic upper traps. Noted trigger points of B upper traps, L  anterior deltoid, supraspinatus, infraspinatus, and rhomboids      Objective: Positioned in prone 15 min STM to treat trigger points, including cross-friction, and compression of upper traps, infraspinatus and supraspinatus. Pt stated that the STM was intense but tolerable.  Pt educated on the risk of dry needling most notably the signs and symptoms of a pneumothorax and to go to ED if any of these signs and symptoms present, pt consented to treatment. 3 needles were placed one in each of the upper traps, infraspinatus and rhomboids. Local twitch response was achieved with all 3. Needles were removed and discarded. Pt educated on possibility of soreness over next few days and verbalized understanding. (No charge for dry needling)  Careful breathing assessment performed and pt educated on  expanding lower ribs on inspiration not just using upper ribs and accessory muscles to breath. Educated on breathing in slowly and pausing before exhaling. Pt demonstrated 10 times and educated on this as a possible technique for relaxation. Pt was told to perform 10 breaths with proper breathing mechanics once every few hours until next visit.                     PT Education - 03/21/16 1058    Education provided Yes   Education Details Course of PT, breathing techniques as form of relaxation   Person(s) Educated Patient   Methods Explanation;Demonstration;Verbal cues   Comprehension Verbalized understanding;Returned demonstration             PT Long Term Goals - 03/21/16 1115      PT LONG TERM GOAL #1   Title Pt. pain at worst will decrease from a 9 to a 6 on NPRS to demonstrate a significant and meaningful change in amount of pain pt has.   Baseline 9/10   Time 4   Period Weeks   Status New     PT LONG TERM GOAL #2   Title Amount of days pt has to remove himself from work a week due to pain will decrease to at most 1 so pt is able to miss less work   Baseline 2 days a week    Time 4   Period Weeks   Status New     PT LONG TERM GOAL #3   Title Pt will have full cervical ROM without reproduction of pain so pt is able to move freely without restriction or pain during work.   Baseline L cervical lateral flexion reproduces parascapular pain   Time 4   Period Weeks   Status New     PT LONG TERM GOAL #4   Title Pt QuickDASH will decrease by 10 to demonstrate a significant amount of greater self reported UE function.   Baseline 65   Time 4   Period Weeks   Status New               Plan - 03/21/16 1100    Clinical Impression Statement Pt is a pleasant 41 year old male with bilateral shoulder and neck pain for 6 years. Pt states that this pain began after a period of time where he was heavily involved of martial arts. Pt had SAD on R shoulder 2 years ago which he states has not helped the pain. Pt gets radiating numbness, tingling, and burning down into both hands that follows no pattern. No specific irrititants of pain however pt states that his pain gets very severe and rates it as a 9/10 on NPRS, he often has to quit working for the day due to pain. Pt states that he does get rams horn pattern headaches when his neck and shoulder pain are exacerbated. Pt states he has discussed the option of bilateral biceps tendon disposition with his surgeon however wants to exhaust all conservative options prior to surgery. Pt presents with trigger points in upper traps and parascapular musculature including supraspinatus, infraspinatus, and rhomboids. Upper quarter neuroscreen WNL bilaterally except decreased sensation of L anterior and posterior neck and supraclavicular fossa compared bilaterally. Pt states that it is because that side is irritated today however when his R is irritated it has decreased sensation as well. ROM of shoulder and Neck WNL, L cervical lateral flexion reproduces pain in L shoulder.   Rehab Potential  Fair   Clinical Impairments Affecting Rehab  Potential Positives: Age, family support         Negatives: Job requirements, chronic pain   PT Frequency 2x / week   PT Duration 4 weeks   PT Treatment/Interventions ADLs/Self Care Home Management;Therapeutic activities;Therapeutic exercise;Manual techniques;Dry needling;Passive range of motion   PT Next Visit Plan Continue STM and dry needling to treat trigger points   PT Home Exercise Plan proper breathing as a form of relaxation   Consulted and Agree with Plan of Care Patient      Patient will benefit from skilled therapeutic intervention in order to improve the following deficits and impairments:  Decreased activity tolerance, Decreased endurance, Decreased range of motion, Hypomobility, Increased muscle spasms, Impaired sensation, Impaired tone, Impaired UE functional use, Impaired perceived functional ability, Postural dysfunction, Pain  Visit Diagnosis: Chronic pain of both shoulders  Chronic neck pain  Cervical radiculopathy, chronic  Neck pain, bilateral     Problem List Patient Active Problem List   Diagnosis Date Noted  . 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 Hypertension 04/06/2013  . Allergic rhinitis 01/18/2013  . Family history of long QT syndrome 06/12/2012  . 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 Smoker 06/12/2012  . Common migraine 05/26/2012  . Neck pain 05/26/2012  . Fibromyalgia 05/26/2012    Fisher,Benjamin PT DPT 03/21/2016, 1:20 PM  [facility] [address] [place], [address] Phone: [phone]   Fax:  [phone]  Name: Raymond [name] MRN: [id number] Date of Birth: Feb 25, 1975

## 2016-03-25 ENCOUNTER — Emergency Department (HOSPITAL_COMMUNITY): Payer: BLUE CROSS/BLUE SHIELD

## 2016-03-25 ENCOUNTER — Encounter (HOSPITAL_COMMUNITY): Payer: Self-pay

## 2016-03-25 DIAGNOSIS — Z79899 Other long term (current) drug therapy: Secondary | ICD-10-CM | POA: Insufficient documentation

## 2016-03-25 DIAGNOSIS — F909 Attention-deficit hyperactivity disorder, unspecified type: Secondary | ICD-10-CM | POA: Insufficient documentation

## 2016-03-25 DIAGNOSIS — R079 Chest pain, unspecified: Secondary | ICD-10-CM | POA: Diagnosis not present

## 2016-03-25 DIAGNOSIS — I10 Essential (primary) hypertension: Secondary | ICD-10-CM | POA: Diagnosis not present

## 2016-03-25 DIAGNOSIS — R071 Chest pain on breathing: Secondary | ICD-10-CM | POA: Diagnosis present

## 2016-03-25 DIAGNOSIS — Z87891 Personal history of nicotine dependence: Secondary | ICD-10-CM | POA: Insufficient documentation

## 2016-03-25 DIAGNOSIS — K219 Gastro-esophageal reflux disease without esophagitis: Secondary | ICD-10-CM | POA: Diagnosis not present

## 2016-03-25 LAB — BASIC METABOLIC PANEL
Anion gap: 14 (ref 5–15)
BUN: 15 mg/dL (ref 6–20)
CO2: 21 mmol/L — ABNORMAL LOW (ref 22–32)
Calcium: 9.7 mg/dL (ref 8.9–10.3)
Chloride: 101 mmol/L (ref 101–111)
Creatinine, Ser: 1.09 mg/dL (ref 0.61–1.24)
GFR calc Af Amer: >60 mL/min (ref >60)
GFR calc non Af Amer: >60 mL/min (ref >60)
Glucose, Bld: 104 mg/dL — ABNORMAL HIGH (ref 65–99)
Potassium: 3.2 mmol/L — ABNORMAL LOW (ref 3.5–5.1)
Sodium: 136 mmol/L (ref 135–145)

## 2016-03-25 LAB — CBC
HCT: 45.6 % (ref 39.0–52.0)
Hemoglobin: 16.1 g/dL (ref 13.0–17.0)
MCH: 30.8 pg (ref 26.0–34.0)
MCHC: 35.3 g/dL (ref 30.0–36.0)
MCV: 87.4 fL (ref 78.0–100.0)
Platelets: 280 10*3/uL (ref 150–400)
RBC: 5.22 MIL/uL (ref 4.22–5.81)
RDW: 12.2 % (ref 11.5–15.5)
WBC: 9.5 10*3/uL (ref 4.0–10.5)

## 2016-03-25 LAB — TROPONIN I: Troponin I: <0.03 ng/mL (ref <0.03)

## 2016-03-25 NOTE — ED Triage Notes (Signed)
Pt endorses right side chest pain with radiation to right arm that began 1 hour pta while laying down. Pt only has epigastric pain at this time. Denies taking any medication.

## 2016-03-26 ENCOUNTER — Emergency Department (HOSPITAL_COMMUNITY)
Admission: EM | Admit: 2016-03-26 | Discharge: 2016-03-26 | Disposition: A | Discharge status: Home / Self Care | Payer: BLUE CROSS/BLUE SHIELD | Attending: Emergency Medicine | Admitting: Emergency Medicine

## 2016-03-26 DIAGNOSIS — K219 Gastro-esophageal reflux disease without esophagitis: Secondary | ICD-10-CM | POA: Diagnosis not present

## 2016-03-26 DIAGNOSIS — I10 Essential (primary) hypertension: Secondary | ICD-10-CM | POA: Diagnosis not present

## 2016-03-26 DIAGNOSIS — F909 Attention-deficit hyperactivity disorder, unspecified type: Secondary | ICD-10-CM | POA: Diagnosis not present

## 2016-03-26 DIAGNOSIS — Z79899 Other long term (current) drug therapy: Secondary | ICD-10-CM | POA: Diagnosis not present

## 2016-03-26 DIAGNOSIS — Z87891 Personal history of nicotine dependence: Secondary | ICD-10-CM | POA: Diagnosis not present

## 2016-03-26 DIAGNOSIS — R0789 Other chest pain: Secondary | ICD-10-CM

## 2016-03-26 LAB — I-STAT TROPONIN, ED: Troponin i, poc: 0 ng/mL (ref 0.00–0.08)

## 2016-03-26 MED ORDER — CYCLOBENZAPRINE HCL 10 MG PO TABS: 10.0000 mg | ORAL_TABLET | Freq: Three times a day (TID) | ORAL | 0 refills | Status: DC | PRN

## 2016-03-26 MED ORDER — OMEPRAZOLE 20 MG PO CPDR: DELAYED_RELEASE_CAPSULE | ORAL | 0 refills | Status: DC

## 2016-03-26 MED ORDER — POTASSIUM CHLORIDE CRYS ER 20 MEQ PO TBCR
40.0000 meq | EXTENDED_RELEASE_TABLET | Freq: Once | ORAL | Status: AC
  Administered 2016-03-26: 40 meq via ORAL
  Filled 2016-03-26: qty 2

## 2016-03-26 MED ORDER — CYCLOBENZAPRINE HCL 10 MG PO TABS
10.0000 mg | ORAL_TABLET | Freq: Once | ORAL | Status: AC
  Administered 2016-03-26: 10 mg via ORAL
  Filled 2016-03-26: qty 1

## 2016-03-26 MED ORDER — NAPROXEN 500 MG PO TABS: 500.0000 mg | ORAL_TABLET | Freq: Two times a day (BID) | ORAL | 0 refills | Status: DC

## 2016-03-26 MED ORDER — KETOROLAC TROMETHAMINE 60 MG/2ML IM SOLN
60.0000 mg | Freq: Once | INTRAMUSCULAR | Status: AC
  Administered 2016-03-26: 60 mg via INTRAMUSCULAR
  Filled 2016-03-26: qty 2

## 2016-03-26 NOTE — Discharge Instructions (Signed)
Use ice and heat to the painful areas. Take the medication as prescribed. STOP THE ORUDIS (KETOPROFEN) WHILE ON THE NAPROXEN!!! Take the prilosec as prescribed for your heart burn. Let Dr Sheila Oats know if not improving in the next week.   Recheck if you get a fever, struggle to breathe or seem worse.

## 2016-03-26 NOTE — ED Provider Notes (Addendum)
[facility] DEPT Provider Note   CSN: [id number] Arrival date & time: 03/25/16  2235  Time seen 04:56 AM   History   Chief Complaint Chief Complaint  Patient presents with  . Chest Pain    HPI Raymond [name] is a 41 y.o. male.  HPI  patient reports he has been having right-sided chest pain and epigastric abdominal pain since about 7:30 PM in the evening on February 5. He states he was laying in bed watching TV. He describes the pain as sharp, burning, and aching. The pain has been there constantly since it started. He denies feeling short of breath but states deep breathing makes the pain worse, nothing makes it feel better. He denies cough, or fever but he does state he gets burning fluid any throat. He denies nausea or vomiting but did have diarrhea once. He states sometimes it radiates into his right arm and up into his right neck. He states he has not had any known injury or change in activity.  Family history is negative for coronary artery disease.  PCP [name], MD   Past Medical History:  Diagnosis Date  . Biceps tendonitis on right 01/2014  . History of gastric ulcer    summer 2015  . Hypertension    has not been taking his medication; advised to start back on med. today  . 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 Shoulder impingement 01/2014   right    Patient Active Problem List   Diagnosis Date Noted  . 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 Hypertension 04/06/2013  . Allergic rhinitis 01/18/2013  . Family history of long QT syndrome 06/12/2012  . 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 Smoker 06/12/2012  . Common migraine 05/26/2012  . Neck pain 05/26/2012  . Fibromyalgia 05/26/2012    Past Surgical History:  Procedure Laterality Date  . CHOLECYSTECTOMY    . RESECTION DISTAL CLAVICAL Right 02/21/2014   Procedure: RESECTION DISTAL CLAVICAL;  Surgeon: Nita Sells, MD;   Location: [facility];  Service: Orthopedics;  Laterality: Right;  . SHOULDER ARTHROSCOPY WITH SUBACROMIAL DECOMPRESSION Right 02/21/2014   Procedure: SHOULDER ARTHROSCOPY WITH SUBACROMIAL DECOMPRESSION, DISTAL CLAVICAL EXCISION, DEBRIDIMENT OF PARTIAL ROTATOR CUFF TEAR;  Surgeon: Nita Sells, MD;  Location: [facility];  Service: Orthopedics;  Laterality: Right;  Right shoulder arthroscopy subacromial decompression, distal clavical excision       Home Medications    Prior to Admission medications   Medication Sig Start Date End Date Taking? Authorizing Provider  amphetamine-dextroamphetamine (ADDERALL) 15 MG tablet Take 1 tablet by mouth 2 (two) times daily with a meal. Patient taking differently: Take 15 mg by mouth 2 (two) times daily as needed (ADHD).  12/13/15  Yes [name], MD  cetirizine-pseudoephedrine (ZYRTEC-D) 5-120 MG per tablet Take 1 tablet by mouth once a week.    Yes Historical Provider, MD  Cholecalciferol (VITAMIN D PO) Take 5,000 mg by mouth daily.   Yes Historical Provider, MD  Eszopiclone 3 MG TABS TAKE 1 TABLET BY MOUTH 30 MINUTES PRIOR TO BEDTIME Patient taking differently: TAKE 1 TABLET BY MOUTH 30 MINUTES PRIOR TO BEDTIME AS NEEDED FOR SLEEP 06/07/15  Yes [name], MD  fluticasone (FLONASE) 50 MCG/ACT nasal spray USE 2 SPRAYS IN EACH NOSTRIL EVERY DAY Patient taking differently: USE 2 SPRAYS IN EACH NOSTRIL EVERY DAY AS NEEDED FOR ALLERGIES 03/17/15  Yes [name], MD  hydrochlorothiazide (MICROZIDE) 12.5 MG capsule TAKE  ONE CAPSULE BY MOUTH EVERY DAY 11/24/14  Yes [name], MD  ketoprofen (ORUDIS) 75 MG capsule TAKE 1 CAPSULE (75 MG TOTAL) BY MOUTH 3 (THREE) TIMES DAILY. 03/20/16  Yes [name], MD  traMADol (ULTRAM) 50 MG tablet TAKE 1 TABLET BY MOUTH EVERY 6 HOURS AS NEEDED FOR PAIN 10/27/15  Yes [name], MD  cyclobenzaprine (FLEXERIL) 10 MG tablet Take 1 tablet (10 mg total) by mouth 3 (three)  times daily as needed (muscle soreness). 03/26/16   [name], MD  naproxen (NAPROSYN) 500 MG tablet Take 1 tablet (500 mg total) by mouth 2 (two) times daily. 03/26/16   [name], MD  omeprazole (PRILOSEC) 20 MG capsule Take 1 po BID x 2 weeks then once a day 03/26/16   [name], MD  predniSONE (DELTASONE) 20 MG tablet Take 1 tablet (20 mg total) by mouth daily with breakfast. Patient not taking: Reported on 03/26/2016 03/11/16   [name], MD    Family History Family History  Problem Relation Age of Onset  . Diabetes Mother   . Heart disease Mother   . Lung disease Father   . Diabetes Father     Social History Social History  Substance Use Topics  . Smoking status: Former Smoker    Quit date: 09/24/2013  . Smokeless tobacco: Never Used  . 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    Physical Exam Updated Vital Signs BP 137/98   Pulse 71   Temp 98 F (36.7 C) (Oral)   Resp 15   Ht 5\' 8"  (1.727 m)   Wt 170 lb (77.1 kg)   SpO2 100%   BMI 25.85 kg/m   Vital signs normal    Physical Exam  Constitutional: He is oriented to person, place, and time. He appears well-developed and well-nourished.  Non-toxic appearance. He does not appear ill. No distress.  HENT:  Head: Normocephalic and atraumatic.  Right Ear: External ear normal.  Left Ear: External ear normal.  Nose: Nose normal. No mucosal edema or rhinorrhea.  Mouth/Throat: Oropharynx is clear and moist and mucous membranes are normal. No dental abscesses or uvula swelling.  Eyes: Conjunctivae and EOM are normal. Pupils are equal, round, and reactive to light.  Neck: Normal range of motion and full passive range of motion without pain. Neck supple.  Cardiovascular: Normal rate, regular rhythm and normal heart  sounds.  Exam reveals no gallop and no friction rub.   No murmur heard. Pulmonary/Chest: Effort normal and breath sounds normal. No respiratory distress. He has no wheezes. He has no rhonchi. He has no rales. He exhibits tenderness. He exhibits no crepitus.    Patient is tender in his right chest. He feels like there is a difference in the muscle mass between the chest on the right in the left however I do not appreciate that. He does not have pain on extreme range of motion of his arm.  Abdominal: Soft. Normal appearance and bowel sounds are normal. He exhibits no distension. There is no tenderness. There is no rebound and no guarding.    Musculoskeletal: Normal range of motion. He exhibits no edema or tenderness.  Moves all extremities well.   Neurological: He is alert and oriented to person, place, and time. He has normal strength. No cranial nerve deficit.  Skin: Skin is warm,  dry and intact. No rash noted. No erythema. No pallor.  Psychiatric: He has a normal mood and affect. His speech is normal and behavior is normal. His mood appears not anxious.  Nursing note and vitals reviewed.    ED Treatments / Results  Labs (all labs ordered are listed, but only abnormal results are displayed) Results for orders placed or performed during the hospital encounter of [id number]  Basic metabolic panel  Result Value Ref Range   Sodium 136 135 - 145 mmol/L   Potassium 3.2 (L) 3.5 - 5.1 mmol/L   Chloride 101 101 - 111 mmol/L   CO2 21 (L) 22 - 32 mmol/L   Glucose, Bld 104 (H) 65 - 99 mg/dL   BUN 15 6 - 20 mg/dL   Creatinine, Ser 1.09 0.61 - 1.24 mg/dL   Calcium 9.7 8.9 - 10.3 mg/dL   GFR calc non Af Amer >60 >60 mL/min   GFR calc Af Amer >60 >60 mL/min   Anion gap 14 5 - 15  CBC  Result Value Ref Range   WBC 9.5 4.0 - 10.5 K/uL   RBC 5.22 4.22 - 5.81 MIL/uL   Hemoglobin 16.1 13.0 - 17.0 g/dL   HCT 45.6 39.0 - 52.0 %   MCV 87.4 78.0 - 100.0 fL   MCH 30.8 26.0 - 34.0 pg   MCHC 35.3 30.0 -  36.0 g/dL   RDW 12.2 11.5 - 15.5 %   Platelets 280 150 - 400 K/uL  Troponin I  Result Value Ref Range   Troponin I <0.03 <0.03 ng/mL  I-stat troponin, ED  Result Value Ref Range   Troponin i, poc 0.00 0.00 - 0.08 ng/mL   Comment 3           Laboratory interpretation all normal except hypokalemia    EKG  EKG Interpretation  Date/Time:  Monday March 25 2016 22:44:59 EST Ventricular Rate:  91 PR Interval:  128 QRS Duration: 86 QT Interval:  350 QTC Calculation: 430 R Axis:   77 Text Interpretation:  Normal sinus rhythm Possible Left atrial enlargement No significant change since last tracing 17 Feb 2014 Confirmed by Montray Kliebert  MD-I, Leyani Gargus ([id number]) on 03/26/2016 4:24:45 AM       Radiology Dg Chest 2 View  Result Date: 03/25/2016 CLINICAL DATA:  Chest pain for 2 hours.  Hypertension. EXAM: CHEST  2 VIEW COMPARISON:  None. FINDINGS: The heart size and mediastinal contours are within normal limits. Both lungs are clear. The visualized skeletal structures are unremarkable. IMPRESSION: No active cardiopulmonary disease. Electronically Signed   By: Ashley Royalty M.D.   On: 03/25/2016 23:40    Procedures Procedures (including critical care time)  Medications Ordered in ED Medications  ketorolac (TORADOL) injection 60 mg (60 mg Intramuscular Given 03/26/16 0535)  cyclobenzaprine (FLEXERIL) tablet 10 mg (10 mg Oral Given 03/26/16 0534)  potassium chloride SA (K-DUR,KLOR-CON) CR tablet 40 mEq (40 mEq Oral Given 03/26/16 0534)     Initial Impression / Assessment and Plan / ED Course  I have reviewed the triage vital signs and the nursing notes.  Pertinent labs & imaging results that were available during my care of the patient were reviewed by me and considered in my medical decision making (see chart for details).   patient was given Toradol and Flexeril for his muscle pain of his chest. He also was given potassium for his hypokalemia.  Patient also has epigastric pain and some heartburn  symptoms. He is status post cholecystectomy. He  was placed back on Prilosec. He was advised to stop the ketoprofen while taken the naproxen. He should follow-up with his PCP if not improving.  Final Clinical Impressions(s) / ED Diagnoses   Final diagnoses:  Chest wall pain  Gastroesophageal reflux disease without esophagitis    New Prescriptions Discharge Medication List as of 03/26/2016  6:49 AM    START taking these medications   Details  cyclobenzaprine (FLEXERIL) 10 MG tablet Take 1 tablet (10 mg total) by mouth 3 (three) times daily as needed (muscle soreness)., Starting Tue 03/26/2016, Print    naproxen (NAPROSYN) 500 MG tablet Take 1 tablet (500 mg total) by mouth 2 (two) times daily., Starting Tue 03/26/2016, Print        Plan discharge  [name], MD, Barbette Or, MD 03/26/16 [place], MD 03/26/16 [phone]

## 2016-03-27 ENCOUNTER — Telehealth: Payer: Self-pay | Admitting: Family Medicine

## 2016-03-27 NOTE — Telephone Encounter (Signed)
Raymond [name] returned Raymond [name]'s call.  Please call her back at [phone].

## 2016-03-27 NOTE — Telephone Encounter (Signed)
[name] left vm requesting cb by Butch Penny; Kenney Houseman has one more question. Left v/m requesting [name] to cb.

## 2016-03-27 NOTE — Telephone Encounter (Signed)
Spouse called and states that ER followup needs to be scheduled today with PCP and she states that is what ER told her.  PCP does not have 30 min hospital followup and all other providers at [facility] are full as well.  Offered appointment for 03/28/16 and this was not OK with Spouse.  Insistent that PCP be notified.  Please advise.  Best number to call is [phone]

## 2016-03-27 NOTE — Telephone Encounter (Signed)
Spoke with [place].  She states Ronzell would like someone to see him tomorrow because his stomach feels cold to the touch and he feels like he may have torn a muscle in his chest.  She states he mentioned this to them in the ER but it wasn't addressed.  He will still keep follow up with Dr. Lorelei Pont on 04/15/16.   Appointment scheduled with Dr. Diona Browner on 03/28/2016 at 11:45 am.

## 2016-03-27 NOTE — Telephone Encounter (Signed)
Kenney Houseman (wife) notified as instructed by telephone.  Follow up appointment scheduled for 04/15/2016 at 9:30 am with Dr. Lorelei Pont.

## 2016-03-27 NOTE — Telephone Encounter (Signed)
I have quoted the hospital ED MD's assessment and plan: "He was placed back on Prilosec. He was advised to stop the ketoprofen while taken the naproxen. He should follow-up with his PCP if not improving."  No urgent need for follow-up based on Dr. Johnsie Kindred assessment. It would likely be of benefit for him to follow-up with me in a couple of weeks to see if improved on prilosec. Please ask him to double check not taking naproxen and ketoprofen at the same time.

## 2016-03-28 ENCOUNTER — Ambulatory Visit (INDEPENDENT_AMBULATORY_CARE_PROVIDER_SITE_OTHER): Payer: BLUE CROSS/BLUE SHIELD | Admitting: Family Medicine

## 2016-03-28 ENCOUNTER — Encounter: Payer: Self-pay | Admitting: Family Medicine

## 2016-03-28 ENCOUNTER — Ambulatory Visit: Payer: BLUE CROSS/BLUE SHIELD | Admitting: Physical Therapy

## 2016-03-28 DIAGNOSIS — R0789 Other chest pain: Secondary | ICD-10-CM | POA: Insufficient documentation

## 2016-03-28 NOTE — Assessment & Plan Note (Signed)
This pain does not seem to be new. Recurrent and likely related to fibromyalgia.  No clear cardiopulmonary issues. Neuropathic pain  meds not helpful in past.  Pt denies depression, anxiety.   Given possible stomach irritation taking 2 NSAIDs.. Hold and  Continue back on prilosec 40 mg daily.  Can use tramadol for pain in limited fashion.  Follow up with PCP if not improving to baseline.

## 2016-03-28 NOTE — Progress Notes (Signed)
   Subjective:    Patient ID: Raymond [name], male    DOB: [birth year], 41 y.o.   MRN: [id number]  HPI    41 year old male pt of Dr. Lillie Fragmin with history of fibromyalgia presents with new onset chest pain.. Started 4 days ago when watching a movie, resting. Feels chest tightness, no SOB, no edema. Noted mild sweating, no dizziness. Has intermittant dysphagia.. Occurred then as well. Has been gooing on for 7 years.  Also noted ringing in ears that night.  Went to ER 2/6 in central chest, burning, radiated to lower abdomen to groin, pain radiated to bilateral legs. Shooting pain. Cbc, CMET nml except low K. Troponin I neg.  CXR: neg EKG: NSR, no ST changes.  He was eating prior to pain.  At ER restarted on prilosec 20 mg .  he has been taking ketoprophen and naprosyn together.   No recent fall or injury.   Given toradol and muscle relaxant.   Since then he has continued to have intermittent pain in chest, occurring 10-20 min, occurring at rest. Off and on all day yesterday. Leg pain has gotten pain.   No cough, no congestion.  Daughter has flu like illness, neg flu test today.    Has similar pain in past.. Neg work up... Dx with fibromyalgia. Rarely using tramadol for pain.  Gabapentin and lyrica did not help in past.  Review of Systems  Constitutional: Negative for fatigue and fever.  HENT: Negative for ear pain.   Eyes: Negative for pain.  Respiratory: Negative for shortness of breath.   Cardiovascular: Positive for chest pain. Negative for palpitations and leg swelling.  Gastrointestinal: Negative for abdominal distention.       Objective:   Physical Exam  Constitutional: Vital signs are normal. He appears well-developed and well-nourished.  HENT:  Head: Normocephalic.  Right Ear: Hearing normal.  Left Ear: Hearing normal.  Nose: Nose normal.  Mouth/Throat: Oropharynx is clear and moist and mucous membranes are normal.  Neck: Trachea normal. Carotid bruit is not  present. No thyroid mass and no thyromegaly present.  Cardiovascular: Normal rate, regular rhythm and normal pulses.  Exam reveals no gallop, no distant heart sounds and no friction rub.   No murmur heard. No peripheral edema  Pulmonary/Chest: Effort normal and breath sounds normal. No respiratory distress. He exhibits tenderness.    Skin: Skin is warm, dry and intact. No rash noted.  Psychiatric: He has a normal mood and affect. His speech is normal and behavior is normal. Thought content normal.          Assessment & Plan:

## 2016-03-28 NOTE — Progress Notes (Signed)
Pre visit review using our clinic review tool, if applicable. No additional management support is needed unless otherwise documented below in the visit note. 

## 2016-03-28 NOTE — Patient Instructions (Addendum)
Chest wall stretching.  Limit antiinflammatories just in case they irritating stomach. Can tramdol as needed for pain.  Stay on the prilosec 2 tabs of 20 mg daily for now.  Avoid acidic foods.  if not improving follow up with PCP.

## 2016-03-29 ENCOUNTER — Emergency Department
Admission: EM | Admit: 2016-03-29 | Discharge: 2016-03-29 | Disposition: A | Discharge status: Home / Self Care | Payer: BLUE CROSS/BLUE SHIELD | Attending: Emergency Medicine | Admitting: Emergency Medicine

## 2016-03-29 ENCOUNTER — Encounter: Payer: Self-pay | Admitting: Emergency Medicine

## 2016-03-29 ENCOUNTER — Emergency Department: Payer: BLUE CROSS/BLUE SHIELD

## 2016-03-29 DIAGNOSIS — R1013 Epigastric pain: Secondary | ICD-10-CM | POA: Diagnosis not present

## 2016-03-29 DIAGNOSIS — I10 Essential (primary) hypertension: Secondary | ICD-10-CM | POA: Diagnosis not present

## 2016-03-29 DIAGNOSIS — Z87891 Personal history of nicotine dependence: Secondary | ICD-10-CM | POA: Insufficient documentation

## 2016-03-29 DIAGNOSIS — R079 Chest pain, unspecified: Secondary | ICD-10-CM | POA: Diagnosis not present

## 2016-03-29 DIAGNOSIS — F909 Attention-deficit hyperactivity disorder, unspecified type: Secondary | ICD-10-CM | POA: Insufficient documentation

## 2016-03-29 DIAGNOSIS — Z79899 Other long term (current) drug therapy: Secondary | ICD-10-CM | POA: Diagnosis not present

## 2016-03-29 LAB — BASIC METABOLIC PANEL
Anion gap: 10 (ref 5–15)
BUN: 21 mg/dL — ABNORMAL HIGH (ref 6–20)
CO2: 24 mmol/L (ref 22–32)
Calcium: 9.7 mg/dL (ref 8.9–10.3)
Chloride: 104 mmol/L (ref 101–111)
Creatinine, Ser: 1.01 mg/dL (ref 0.61–1.24)
GFR calc Af Amer: >60 mL/min (ref >60)
GFR calc non Af Amer: >60 mL/min (ref >60)
Glucose, Bld: 94 mg/dL (ref 65–99)
Potassium: 3.5 mmol/L (ref 3.5–5.1)
Sodium: 138 mmol/L (ref 135–145)

## 2016-03-29 LAB — CBC
HCT: 45.9 % (ref 40.0–52.0)
Hemoglobin: 16.2 g/dL (ref 13.0–18.0)
MCH: 30.7 pg (ref 26.0–34.0)
MCHC: 35.3 g/dL (ref 32.0–36.0)
MCV: 86.8 fL (ref 80.0–100.0)
Platelets: 248 10*3/uL (ref 150–440)
RBC: 5.28 MIL/uL (ref 4.40–5.90)
RDW: 12.6 % (ref 11.5–14.5)
WBC: 10.2 10*3/uL (ref 3.8–10.6)

## 2016-03-29 LAB — HEPATIC FUNCTION PANEL
ALT: 60 U/L (ref 17–63)
AST: 36 U/L (ref 15–41)
Albumin: 5 g/dL (ref 3.5–5.0)
Alkaline Phosphatase: 55 U/L (ref 38–126)
Bilirubin, Direct: <0.1 mg/dL — ABNORMAL LOW (ref 0.1–0.5)
Total Bilirubin: 0.7 mg/dL (ref 0.3–1.2)
Total Protein: 7.9 g/dL (ref 6.5–8.1)

## 2016-03-29 LAB — TROPONIN I: Troponin I: <0.03 ng/mL (ref <0.03)

## 2016-03-29 LAB — LIPASE, BLOOD: Lipase: 29 U/L (ref 11–51)

## 2016-03-29 MED ORDER — GI COCKTAIL ~~LOC~~
30.0000 mL | Freq: Once | ORAL | Status: AC
  Administered 2016-03-29: 30 mL via ORAL
  Filled 2016-03-29: qty 30

## 2016-03-29 MED ORDER — SODIUM CHLORIDE 0.9 % IV BOLUS (SEPSIS)
1000.0000 mL | Freq: Once | INTRAVENOUS | Status: AC
  Administered 2016-03-29: 1000 mL via INTRAVENOUS

## 2016-03-29 NOTE — ED Notes (Signed)
Warm blankets provided to pt and spouse. Pt updated on results of xray.

## 2016-03-29 NOTE — ED Triage Notes (Signed)
C/O mid upper chest pain.  Patient was seen for same complaint Monday night at [facility] ED.

## 2016-03-29 NOTE — ED Notes (Signed)
Pt provided with po fluids for po challenge per request of md.

## 2016-03-29 NOTE — ED Provider Notes (Addendum)
[facility] Emergency Department Provider Note  ____________________________________________   I have reviewed the triage vital signs and the nursing notes.   HISTORY  Chief Complaint Chest Pain    HPI Raymond [name] is a 41 y.o. male presents today complaining of not chest pain but epigastric abdominal pain. Patient has a history he states of GERD. He states that someone told him he may have had an ulcer "once". He states that he has had no melena bright red blood per rectum. He states that he has only been taking his anti-acid since he was seen on Tuesday for the same complaint. He denies alcohol abuse or pancreatitis. Patient states he has had copious nonbloody nonbilious non-melanotic diarrhea. He states that this is not normal for him. All of this started, leg cramping pain in the epigastric region as well as his diarrhea, on Tuesday. He states that he has worse pain in his stomach when he eats food. He denies chest or Obstruction symptoms such as vomiting or bilious vomiting or inability to handle secretions. Aside from food, the pain is also worse when he twists or touches the area. He denies any fever or chills. He has had nothing that makes the pain better aside from an staining from food. He does get some release also from his antacids. Is a burning discomfort he describes as goes up towards his throat sometimes. Worse when he lies flat. He has had no history of pancreatitis in the past. He does not have a gallbladder anymore.       Past Medical History:  Diagnosis Date  . Biceps tendonitis on right 01/2014  . History of gastric ulcer    summer 2015  . Hypertension    has not been taking his medication; advised to start back on med. today  . 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 Shoulder impingement 01/2014   right    Patient Active Problem List   Diagnosis Date Noted  . Chest wall pain 03/28/2016  . Attention deficit hyperactivity  disorder (ADHD), predominantly inattentive type 05/19/2014  . Hypertension 04/06/2013  . Allergic rhinitis 01/18/2013  . Family history of long QT syndrome 06/12/2012  . 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 Smoker 06/12/2012  . Common migraine 05/26/2012  . Neck pain 05/26/2012  . Fibromyalgia 05/26/2012    Past Surgical History:  Procedure Laterality Date  . CHOLECYSTECTOMY    . RESECTION DISTAL CLAVICAL Right 02/21/2014   Procedure: RESECTION DISTAL CLAVICAL;  Surgeon: Nita Sells, MD;  Location: [place];  Service: Orthopedics;  Laterality: Right;  . SHOULDER ARTHROSCOPY WITH SUBACROMIAL DECOMPRESSION Right 02/21/2014   Procedure: SHOULDER ARTHROSCOPY WITH SUBACROMIAL DECOMPRESSION, DISTAL CLAVICAL EXCISION, DEBRIDIMENT OF PARTIAL ROTATOR CUFF TEAR;  Surgeon: Nita Sells, MD;  Location: [place];  Service: Orthopedics;  Laterality: Right;  Right shoulder arthroscopy subacromial decompression, distal clavical excision    Prior to Admission medications   Medication Sig Start Date End Date Taking? Authorizing Provider  amphetamine-dextroamphetamine (ADDERALL) 15 MG tablet Take 1 tablet by mouth 2 (two) times daily with a meal. Patient taking differently: Take 15 mg by mouth 2 (two) times daily as needed (ADHD).  12/13/15   [name], MD  cetirizine-pseudoephedrine (ZYRTEC-D) 5-120 MG per tablet Take 1 tablet by mouth once a week.     Historical Provider, MD  Cholecalciferol (VITAMIN D PO) Take 5,000 mg by mouth daily.    Historical Provider, MD  cyclobenzaprine (FLEXERIL) 10 MG  tablet Take 1 tablet (10 mg total) by mouth 3 (three) times daily as needed (muscle soreness). 03/26/16   [name], MD  Eszopiclone 3 MG TABS TAKE 1 TABLET BY MOUTH 30 MINUTES PRIOR TO BEDTIME Patient taking differently: TAKE 1 TABLET BY MOUTH 30 MINUTES PRIOR TO BEDTIME AS NEEDED FOR SLEEP 06/07/15   [name], MD  fluticasone (FLONASE) 50 MCG/ACT  nasal spray USE 2 SPRAYS IN EACH NOSTRIL EVERY DAY Patient taking differently: USE 2 SPRAYS IN EACH NOSTRIL EVERY DAY AS NEEDED FOR ALLERGIES 03/17/15   [name], MD  hydrochlorothiazide (MICROZIDE) 12.5 MG capsule TAKE ONE CAPSULE BY MOUTH EVERY DAY 11/24/14   [name], MD  ketoprofen (ORUDIS) 75 MG capsule TAKE 1 CAPSULE (75 MG TOTAL) BY MOUTH 3 (THREE) TIMES DAILY. 03/20/16   [name], MD  naproxen (NAPROSYN) 500 MG tablet Take 1 tablet (500 mg total) by mouth 2 (two) times daily. 03/26/16   [name], MD  omeprazole (PRILOSEC) 20 MG capsule Take 1 po BID x 2 weeks then once a day 03/26/16   [name], MD  predniSONE (DELTASONE) 20 MG tablet Take 1 tablet (20 mg total) by mouth daily with breakfast. 03/11/16   [name], MD  traMADol (ULTRAM) 50 MG tablet TAKE 1 TABLET BY MOUTH EVERY 6 HOURS AS NEEDED FOR PAIN 10/27/15   [name], MD    Allergies Azithromycin; Percocet [oxycodone-acetaminophen]; Vicodin [hydrocodone-acetaminophen]; and Adhesive [tape]  Family History  Problem Relation Age of Onset  . Diabetes Mother   . Heart disease Mother   . Lung disease Father   . Diabetes Father     Social History Social History  Substance Use Topics  . Smoking status: Former Smoker    Quit date: 09/24/2013  . Smokeless tobacco: Never Used  . Alcohol use No    Review of Systems Constitutional: No fever/chills Eyes: No visual changes. ENT: No sore throat. No stiff neck no neck pain Cardiovascular: Denies chest pain. Respiratory: Denies shortness of breath. Gastrointestinal:   no vomiting.  Positive diarrhea.  No constipation. Genitourinary: Negative for dysuria. Musculoskeletal: Negative lower extremity swelling Skin: Negative for rash. Neurological: Negative for severe headaches, focal weakness or numbness. 10-point ROS otherwise negative.  ____________________________________________   PHYSICAL EXAM:  VITAL SIGNS: ED Triage Vitals  Enc Vitals Group      BP 03/29/16 1706 (!) 149/102     Pulse Rate 03/29/16 1706 90     Resp 03/29/16 1706 20     Temp 03/29/16 1706 97.9 F (36.6 C)     Temp Source 03/29/16 1706 Oral     SpO2 03/29/16 1706 100 %     Weight --      Height --      Head Circumference --      Peak Flow --      Pain Score 03/29/16 1251 3     Pain Loc --      Pain Edu? --      Excl. in [place]? --     Constitutional: Alert and oriented. Well appearing and in no acute distress. Eyes: Conjunctivae are normal. PERRL. EOMI. Head: Atraumatic. Nose: No congestion/rhinnorhea. Mouth/Throat: Mucous membranes are moist.  Oropharynx non-erythematous. Neck: No stridor.   Nontender with no meningismus Cardiovascular: Normal rate, regular rhythm. Grossly normal heart sounds.  Good peripheral circulation. Respiratory: Normal respiratory effort.  No retractions. Lungs CTAB. Abdominal: Soft and Minimal tenderness to palpation the epigastric region. This does reproduce his discomfort. No distention. No guarding  no rebound Back:  There is no focal tenderness or step off.  there is no midline tenderness there are no lesions noted. there is no CVA tenderness Musculoskeletal: No lower extremity tenderness, no upper extremity tenderness. No joint effusions, no DVT signs strong distal pulses no edema Neurologic:  Normal speech and language. No gross focal neurologic deficits are appreciated.  Skin:  Skin is warm, dry and intact. No rash noted. Psychiatric: Mood and affect are normal. Speech and behavior are normal.  ____________________________________________   LABS (all labs ordered are listed, but only abnormal results are displayed)  Labs Reviewed  BASIC METABOLIC PANEL - Abnormal; Notable for the following:       Result Value   BUN 21 (*)    All other components within normal limits  CBC  TROPONIN I  LIPASE, BLOOD  HEPATIC FUNCTION PANEL   ____________________________________________  EKG  I personally interpreted any EKGs ordered  by me or triage Normal sinus rhythm rate 75 bpm no acute ST elevation or acute ST depression normal axis unremarkable EKG ____________________________________________  RADIOLOGY  I reviewed any imaging ordered by me or triage that were performed during my shift and, if possible, patient and/or family made aware of any abnormal findings. ____________________________________________   PROCEDURES  Procedure(s) performed: None  Procedures  Critical Care performed: None  ____________________________________________   INITIAL IMPRESSION / ASSESSMENT AND PLAN / ED COURSE  Pertinent labs & imaging results that were available during my care of the patient were reviewed by me and considered in my medical decision making (see chart for details).  Age with very food related abdominal pain and diarrhea for 3-4 days. Very well-appearing blood work reassuring vital signs reassuring, the patient has had negative cardiac workup for this already, I do not think this represents ACS PE or dissection. His very reproducible epigastric pain worse with food. We'll give him a GI cocktail, troponin is again negative despite 3 days of constant discomfort I do not think there is any reason to serially check his enzymes any further. EKG is reassuring there is no peritoneal signs there's nothing to suggest that the patient has a perfect viscus or other intra-abdominal pathology requiring imaging emergently. There is no evidence of perforated ulcer, there is no evidence of stomach volvulus there is no evidence of diverticulitis and appendicitis there is no evidence of upper GI or lower GI bleed etc. We will give him IV fluids, and we will reassess after that. Patient is quite well-appearing.   ----------------------------------------- 7:27 PM on 03/29/2016 -----------------------------------------  Abdomen remains completely benign workup is completely reassuring at this time, noticed pink or tenderness or liver  function test abnormality no evidence of ACS etc. We have given him IV fluid as he may have a little elevation in his BUN/creatinine ratio but there is no evidence of significant pathology on anything that we have checked. This is abdominal discomfort in the epigastric region which is worse with food. Patient is now started on antacids and I have encouraged him to continue with that. He does not have a surgical abdomen there is no evidence of perforation. We will do a 2 view of the abdomen as a precaution but I do not think the radiation of a CT scan is warranted given his history. Patient very comfortable with this. He had a great resolution of symptoms after his GI cocktail and he feels much better. ____________________________________________   FINAL CLINICAL IMPRESSION(S) / ED DIAGNOSES  Final diagnoses:  None  This chart was dictated using voice recognition software.  Despite best efforts to proofread,  errors can occur which can change meaning.      Schuyler Amor, MD 03/29/16 [place], MD 03/29/16 [phone]

## 2016-03-29 NOTE — ED Notes (Signed)
Pt states pain is "like a bubble" to right and left chest. Pt states pain is worse with movement. Pt denies nausea at this time.

## 2016-04-01 ENCOUNTER — Ambulatory Visit: Payer: BLUE CROSS/BLUE SHIELD | Admitting: Physical Therapy

## 2016-04-01 ENCOUNTER — Telehealth: Payer: Self-pay

## 2016-04-01 NOTE — Telephone Encounter (Signed)
Wife calls stating ER ordered for patient to be seen today following visit on 03/29/16.  Currently, we have no openings and Dr. Lorelei Pont is out of the office all week.  Spoke with Dr. Diona Browner after reviewing ER notes and deemed it safe to have patient wait until next opening  with her tomorrow am, 04/02/16 at 8:30am.    However, if any changes or concerns develop today, this Probation officer did instruct wife to call us back.  Wife verbalizes understanding.

## 2016-04-02 ENCOUNTER — Ambulatory Visit (INDEPENDENT_AMBULATORY_CARE_PROVIDER_SITE_OTHER): Payer: BLUE CROSS/BLUE SHIELD | Admitting: Family Medicine

## 2016-04-02 ENCOUNTER — Encounter: Payer: Self-pay | Admitting: Family Medicine

## 2016-04-02 ENCOUNTER — Ambulatory Visit (INDEPENDENT_AMBULATORY_CARE_PROVIDER_SITE_OTHER)
Admission: RE | Admit: 2016-04-02 | Discharge: 2016-04-02 | Disposition: A | Discharge status: Home / Self Care | Payer: BLUE CROSS/BLUE SHIELD | Source: Ambulatory Visit | Attending: Family Medicine | Admitting: Family Medicine

## 2016-04-02 ENCOUNTER — Telehealth: Payer: Self-pay | Admitting: Family Medicine

## 2016-04-02 VITALS — BP 150/90 | HR 94 | Temp 97.5°F | Ht 68.0 in | Wt 163.0 lb

## 2016-04-02 DIAGNOSIS — M542 Cervicalgia: Secondary | ICD-10-CM | POA: Diagnosis not present

## 2016-04-02 DIAGNOSIS — M797 Fibromyalgia: Secondary | ICD-10-CM

## 2016-04-02 DIAGNOSIS — R0789 Other chest pain: Secondary | ICD-10-CM | POA: Diagnosis not present

## 2016-04-02 MED ORDER — PREDNISONE 20 MG PO TABS: ORAL_TABLET | ORAL | 0 refills | Status: DC

## 2016-04-02 MED ORDER — TRAMADOL HCL 50 MG PO TABS: 50.0000 mg | ORAL_TABLET | Freq: Four times a day (QID) | ORAL | 0 refills | Status: DC | PRN

## 2016-04-02 NOTE — Patient Instructions (Addendum)
Repeat course of prednisone.  Can use tramadol for breakthrough pain.  Stop at front to speak with [facility] about referral.

## 2016-04-02 NOTE — Progress Notes (Signed)
Subjective:    Patient ID: Raymond [name], male    DOB: 09-Dec-1975, 41 y.o.   MRN: [id number]  HPI  41 year old male  Pt of Dr. Lillie Fragmin  With history of cervical radiculopathypresents for continued chest wall pain, epigastric pain.   He was seen again in ER on 2/9 ( second trip for same issue in 1 week, 2/6 was other date, I saw him in follow up on 2/8).  CMET, lipase, troponin I ,EKG, CXR, KUB: ALL Neg  Given GI cocktail and IV fluids.  Symptoms did not change.  He know reports pain in neck at base of skull, radiates bilaterally into neck to anterior chest. Better with lying down.. All pain goes away.  Pain with moving head. Tingling to arms with onset of symptoms.. None now.  no new weakness.  He has noted grinding in neck.  Feels like all symptoms started after " something slipped in my neck"  On 2/1. No new fall, occurred when picking something up heavy and shifted neck.. Felt like something in neck slipped. Seeing chiropractor who states C2 slipped.Marland Kitchen No X-ray done.  Last OV today.  He has had issues with neck in past. Dx with cervical radiculopathy on 03/11/2016 by PCP Prednisone resolved cervical rad symptoms 1/22 until these new symptoms.   X-ray of  C spine 03/11/2016:  IMPRESSION: Loss of normal cervical lordosis. This could be related to positioning, torticollis, or ligamentous injury. No evidence of fracture or dislocation. ESI in past Dr. Sharlet Salina, last in 2012.. Did not help. Saw Dr. Joya Salm few years ago as well  PER PT GIVEN REFERRAL TO NECK SPECIALIST IN 02/2016.Marland Kitchen Never HEARD BACK   Referral to Dr. Joya Salm  Pt cancelled PT given these current issues   He has been using naproxen .Marland Kitchen Does not help much. Flexeril does not help. Does not have tramadol.  Social History /Family History/Past Medical History reviewed and updated if needed.    Review of Systems  Constitutional: Negative for fatigue and fever.  HENT: Negative for ear pain.   Eyes: Negative for  pain.  Respiratory: Positive for shortness of breath.   Cardiovascular: Positive for chest pain.  Gastrointestinal: Negative for abdominal distention, constipation, diarrhea and nausea.       Objective:   Physical Exam  Constitutional: He is oriented to person, place, and time. Vital signs are normal. He appears well-developed and well-nourished.  HENT:  Head: Normocephalic.  Right Ear: Hearing normal.  Left Ear: Hearing normal.  Nose: Nose normal.  Mouth/Throat: Oropharynx is clear and moist and mucous membranes are normal.  Neck: Trachea normal. Carotid bruit is not present. No thyroid mass and no thyromegaly present.  Cardiovascular: Normal rate, regular rhythm and normal pulses.  Exam reveals no gallop, no distant heart sounds and no friction rub.   No murmur heard. No peripheral edema  Pulmonary/Chest: Effort normal and breath sounds normal. No respiratory distress.  Musculoskeletal:       Cervical back: He exhibits decreased range of motion, tenderness and bony tenderness.  ttp cervical spine to palpation  Pain in neck with spurling's but no  Radiation of pain to arms.  Neurological: He is alert and oriented to person, place, and time. He has normal strength. No cranial nerve deficit or sensory deficit. He exhibits normal muscle tone. He displays a negative Romberg sign. Coordination and gait normal. GCS eye subscore is 4. GCS verbal subscore is 5. GCS motor subscore is 6.  Skin: Skin is warm,  dry and intact. No rash noted.  Psychiatric: He has a normal mood and affect. His speech is normal and behavior is normal. Thought content normal.          Assessment & Plan:

## 2016-04-02 NOTE — Assessment & Plan Note (Signed)
This may be contributing to pt's heightened level of pain as well as atypical symtpoms. Possibly some of the pain is due to this diagnosis.

## 2016-04-02 NOTE — Telephone Encounter (Signed)
Patient's wife,[name],called to get the results of patient's x-ray done today.

## 2016-04-02 NOTE — Assessment & Plan Note (Addendum)
Chest pain at this point seems to all be radiating from neck per pt.   No clear cardiopulmonary source.   Neg GI  labs and not changed with PPI

## 2016-04-02 NOTE — Assessment & Plan Note (Addendum)
Pt with worsening neck symptoms.Had similar , not as bad in 02/2016... Improved with pred course until recently, new injury.  X-ray unrevealing in January.  Will re-xray today given new injury.  Pt with history of herniated disc DDD etc. Symptoms may be caused by central spinal compression of spinal cord, but are still atypical. Stop  NSAID and repeat prednisone taper.  Tramadol for breakthrough pain.  Referred back to neurosurgery Dr. Joya Salm in January.. Currently  Referral in process.. May be until 04/2016 to be seen again.

## 2016-04-02 NOTE — Progress Notes (Signed)
Pre visit review using our clinic review tool, if applicable. No additional management support is needed unless otherwise documented below in the visit note. 

## 2016-04-03 ENCOUNTER — Encounter: Payer: BLUE CROSS/BLUE SHIELD | Admitting: Physical Therapy

## 2016-04-04 ENCOUNTER — Ambulatory Visit: Payer: BLUE CROSS/BLUE SHIELD | Admitting: Physical Therapy

## 2016-04-04 ENCOUNTER — Encounter: Payer: Self-pay | Admitting: Family Medicine

## 2016-04-05 ENCOUNTER — Emergency Department (HOSPITAL_COMMUNITY)
Admission: EM | Admit: 2016-04-05 | Discharge: 2016-04-06 | Disposition: A | Discharge status: Home / Self Care | Payer: BLUE CROSS/BLUE SHIELD | Attending: Emergency Medicine | Admitting: Emergency Medicine

## 2016-04-05 ENCOUNTER — Encounter (HOSPITAL_COMMUNITY): Payer: Self-pay

## 2016-04-05 ENCOUNTER — Emergency Department (HOSPITAL_COMMUNITY): Payer: BLUE CROSS/BLUE SHIELD

## 2016-04-05 DIAGNOSIS — Z79899 Other long term (current) drug therapy: Secondary | ICD-10-CM | POA: Diagnosis not present

## 2016-04-05 DIAGNOSIS — I10 Essential (primary) hypertension: Secondary | ICD-10-CM | POA: Insufficient documentation

## 2016-04-05 DIAGNOSIS — Z87891 Personal history of nicotine dependence: Secondary | ICD-10-CM | POA: Diagnosis not present

## 2016-04-05 DIAGNOSIS — R0789 Other chest pain: Secondary | ICD-10-CM | POA: Insufficient documentation

## 2016-04-05 DIAGNOSIS — F909 Attention-deficit hyperactivity disorder, unspecified type: Secondary | ICD-10-CM | POA: Diagnosis not present

## 2016-04-05 DIAGNOSIS — R0602 Shortness of breath: Secondary | ICD-10-CM | POA: Diagnosis not present

## 2016-04-05 DIAGNOSIS — R079 Chest pain, unspecified: Secondary | ICD-10-CM | POA: Diagnosis not present

## 2016-04-05 LAB — CBC
HCT: 46.7 % (ref 39.0–52.0)
Hemoglobin: 16.9 g/dL (ref 13.0–17.0)
MCH: 30.7 pg (ref 26.0–34.0)
MCHC: 36.2 g/dL — ABNORMAL HIGH (ref 30.0–36.0)
MCV: 84.8 fL (ref 78.0–100.0)
Platelets: 306 10*3/uL (ref 150–400)
RBC: 5.51 MIL/uL (ref 4.22–5.81)
RDW: 11.9 % (ref 11.5–15.5)
WBC: 18.9 10*3/uL — ABNORMAL HIGH (ref 4.0–10.5)

## 2016-04-05 LAB — BASIC METABOLIC PANEL
Anion gap: 15 (ref 5–15)
BUN: 22 mg/dL — ABNORMAL HIGH (ref 6–20)
CO2: 20 mmol/L — ABNORMAL LOW (ref 22–32)
Calcium: 10.4 mg/dL — ABNORMAL HIGH (ref 8.9–10.3)
Chloride: 102 mmol/L (ref 101–111)
Creatinine, Ser: 1.21 mg/dL (ref 0.61–1.24)
GFR calc Af Amer: >60 mL/min (ref >60)
GFR calc non Af Amer: >60 mL/min (ref >60)
Glucose, Bld: 151 mg/dL — ABNORMAL HIGH (ref 65–99)
Potassium: 3.4 mmol/L — ABNORMAL LOW (ref 3.5–5.1)
Sodium: 137 mmol/L (ref 135–145)

## 2016-04-05 LAB — I-STAT TROPONIN, ED: Troponin i, poc: 0 ng/mL (ref 0.00–0.08)

## 2016-04-05 MED ORDER — LIDOCAINE 5 % EX PTCH: 1.0000 | MEDICATED_PATCH | CUTANEOUS | 0 refills | Status: DC

## 2016-04-05 NOTE — Telephone Encounter (Signed)
pts wife wants rx to go to [facility]. Pt will call [facility] and have transferred.

## 2016-04-05 NOTE — ED Triage Notes (Signed)
Pt complaining of central R chest pain x 1 day. Pt states radiates to bilateral shoulders. Pt states seen here for same x 3. Pt states coughing, states tightness of R side of chest. Pt denies any N/V/D. Pt complaining of some nausea and dizziness.

## 2016-04-06 ENCOUNTER — Encounter: Payer: Self-pay | Admitting: Family Medicine

## 2016-04-06 ENCOUNTER — Emergency Department (HOSPITAL_COMMUNITY): Payer: BLUE CROSS/BLUE SHIELD

## 2016-04-06 DIAGNOSIS — R0789 Other chest pain: Secondary | ICD-10-CM | POA: Diagnosis not present

## 2016-04-06 DIAGNOSIS — Z79899 Other long term (current) drug therapy: Secondary | ICD-10-CM | POA: Diagnosis not present

## 2016-04-06 DIAGNOSIS — Z87891 Personal history of nicotine dependence: Secondary | ICD-10-CM | POA: Diagnosis not present

## 2016-04-06 DIAGNOSIS — F909 Attention-deficit hyperactivity disorder, unspecified type: Secondary | ICD-10-CM | POA: Diagnosis not present

## 2016-04-06 DIAGNOSIS — I10 Essential (primary) hypertension: Secondary | ICD-10-CM | POA: Diagnosis not present

## 2016-04-06 DIAGNOSIS — R079 Chest pain, unspecified: Secondary | ICD-10-CM | POA: Diagnosis not present

## 2016-04-06 MED ORDER — SODIUM CHLORIDE 0.9 % IV BOLUS (SEPSIS)
1000.0000 mL | Freq: Once | INTRAVENOUS | Status: AC
  Administered 2016-04-06: 1000 mL via INTRAVENOUS

## 2016-04-06 MED ORDER — IOPAMIDOL (ISOVUE-300) INJECTION 61%
INTRAVENOUS | Status: AC
  Administered 2016-04-06: 75 mL
  Filled 2016-04-06: qty 75

## 2016-04-06 MED ORDER — KETOROLAC TROMETHAMINE 30 MG/ML IJ SOLN
30.0000 mg | Freq: Once | INTRAMUSCULAR | Status: AC
  Administered 2016-04-06: 30 mg via INTRAVENOUS
  Filled 2016-04-06: qty 1

## 2016-04-06 NOTE — ED Notes (Signed)
Pt to CT via stretcher

## 2016-04-06 NOTE — ED Notes (Signed)
Pt c/o central CP radiating to upper chest x 2 weeks, pt states he feels as "something is moving". Pt states he has had multiple visits to PCP and ED for same. Pt denies fever, denies chills. Pt states he does have pain radiating down R arm when pain occurs. Pain worse with sitting up and breathing.

## 2016-04-06 NOTE — Discharge Instructions (Signed)
Continue Naproxen (Aleve) and try taking Flexeril as well Follow up with your PCP

## 2016-04-06 NOTE — ED Notes (Signed)
Pt returned from CT °

## 2016-04-06 NOTE — ED Notes (Signed)
Patient transported to CT 

## 2016-04-06 NOTE — ED Provider Notes (Signed)
[place] DEPT Provider Note   CSN: [id number] Arrival date & time: 04/05/16  1752   History   Chief Complaint No chief complaint on file.   HPI Raymond [name] is a 41 y.o. male who presents with chest pain. PMH significant for HTN, hx of gastric ulcer. Past surgical hx significant for cholecystectomy. He states he has had intermittent chest pain for the past 2 weeks. It is in the central and right side of his chest. Worse with leaning forward. Better with lying back. He has been to the ED twice for this issue and once to his PCP who prescribed him steroids. He has been on tramadol, naproxen, steroids, and prilosec with minimal relief. Endorses some SOB when the pain is severe. Denies fever, chills, N/V/D, constipation, melena/hematochezia.  HPI  Past Medical History:  Diagnosis Date  . Biceps tendonitis on right 01/2014  . History of gastric ulcer    summer 2015  . Hypertension    has not been taking his medication; advised to start back on med. today  . 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 Shoulder impingement 01/2014   right    Patient Active Problem List   Diagnosis Date Noted  . Chest wall pain 03/28/2016  . 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 Hypertension 04/06/2013  . Allergic rhinitis 01/18/2013  . Family history of long QT syndrome 06/12/2012  . 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 Smoker 06/12/2012  . Common migraine 05/26/2012  . Neck pain 05/26/2012  . Fibromyalgia 05/26/2012    Past Surgical History:  Procedure Laterality Date  . CHOLECYSTECTOMY    . RESECTION DISTAL CLAVICAL Right 02/21/2014   Procedure: RESECTION DISTAL CLAVICAL;  Surgeon: Nita Sells, MD;  Location: [facility];  Service: Orthopedics;  Laterality: Right;  . SHOULDER ARTHROSCOPY WITH SUBACROMIAL DECOMPRESSION Right 02/21/2014   Procedure: SHOULDER ARTHROSCOPY WITH SUBACROMIAL DECOMPRESSION,  DISTAL CLAVICAL EXCISION, DEBRIDIMENT OF PARTIAL ROTATOR CUFF TEAR;  Surgeon: Nita Sells, MD;  Location: [facility];  Service: Orthopedics;  Laterality: Right;  Right shoulder arthroscopy subacromial decompression, distal clavical excision       Home Medications    Prior to Admission medications   Medication Sig Start Date End Date Taking? Authorizing Provider  amphetamine-dextroamphetamine (ADDERALL) 15 MG tablet Take 1 tablet by mouth 2 (two) times daily with a meal. Patient taking differently: Take 15 mg by mouth 2 (two) times daily as needed (ADHD).  12/13/15   [name], MD  cetirizine-pseudoephedrine (ZYRTEC-D) 5-120 MG per tablet Take 1 tablet by mouth once a week.     Historical Provider, MD  Cholecalciferol (VITAMIN D PO) Take 5,000 mg by mouth daily.    Historical Provider, MD  Eszopiclone 3 MG TABS TAKE 1 TABLET BY MOUTH 30 MINUTES PRIOR TO BEDTIME Patient taking differently: TAKE 1 TABLET BY MOUTH 30 MINUTES PRIOR TO BEDTIME AS NEEDED FOR SLEEP 06/07/15   [name], MD  fluticasone (FLONASE) 50 MCG/ACT nasal spray USE 2 SPRAYS IN EACH NOSTRIL EVERY DAY Patient taking differently: USE 2 SPRAYS IN EACH NOSTRIL EVERY DAY AS NEEDED FOR ALLERGIES 03/17/15   [name], MD  hydrochlorothiazide (MICROZIDE) 12.5 MG capsule TAKE ONE CAPSULE BY MOUTH EVERY DAY 11/24/14   [name], MD  lidocaine (LIDODERM) 5 % Place 1 patch onto the skin daily. Remove & Discard patch within 12 hours or as directed by MD 04/05/16   Jinny Sanders, MD  naproxen (NAPROSYN) 500  MG tablet Take 1 tablet (500 mg total) by mouth 2 (two) times daily. 03/26/16   [name], MD  predniSONE (DELTASONE) 20 MG tablet 3 tabs by mouth daily x 3 days, then 2 tabs by mouth daily x 2 days then 1 tab by mouth daily x 2 days 04/02/16   Jinny Sanders, MD  traMADol (ULTRAM) 50 MG tablet Take 1 tablet (50 mg total) by mouth every 6 (six) hours as needed. for pain 04/02/16   Jinny Sanders, MD      Family History Family History  Problem Relation Age of Onset  . Diabetes Mother   . Heart disease Mother   . Lung disease Father   . Diabetes Father     Social History Social History  Substance Use Topics  . Smoking status: Former Smoker    Quit date: 09/24/2013  . Smokeless tobacco: Never Used  . Alcohol use No     Allergies   Azithromycin; Percocet [oxycodone-acetaminophen]; Vicodin [hydrocodone-acetaminophen]; and Adhesive [tape]   Review of Systems Review of Systems  Constitutional: Negative for chills, diaphoresis and fever.  Respiratory: Positive for cough and shortness of breath.   Cardiovascular: Positive for chest pain. Negative for palpitations and leg swelling.  Gastrointestinal: Negative for abdominal pain, blood in stool, constipation, diarrhea, nausea and vomiting.  All other systems reviewed and are negative.    Physical Exam Updated Vital Signs BP (!) 143/109 (BP Location: Right Arm)   Pulse 77   Temp 98 F (36.7 C) (Oral)   Resp 18   SpO2 100%   Physical Exam  Constitutional: He is oriented to person, place, and time. He appears well-developed and well-nourished. No distress.  HENT:  Head: Normocephalic and atraumatic.  Eyes: Conjunctivae are normal. Pupils are equal, round, and reactive to light. Right eye exhibits no discharge. Left eye exhibits no discharge. No scleral icterus.  Neck: Normal range of motion.  Cardiovascular: Normal rate and regular rhythm.  Exam reveals no gallop and no friction rub.   No murmur heard. Pulmonary/Chest: Effort normal and breath sounds normal. No respiratory distress. He has no wheezes. He has no rales. He exhibits tenderness (Easily reproducible tenderness of right side of chest wall and sternum).  Abdominal: Soft. Bowel sounds are normal. He exhibits no distension and no mass. There is no tenderness. There is no rebound and no guarding. No hernia.  Lap chole scars noted  Neurological: He is alert and  oriented to person, place, and time.  Skin: Skin is warm and dry.  Psychiatric: He has a normal mood and affect. His behavior is normal.  Nursing note and vitals reviewed.    ED Treatments / Results  Labs (all labs ordered are listed, but only abnormal results are displayed) Labs Reviewed  BASIC METABOLIC PANEL - Abnormal; Notable for the following:       Result Value   Potassium 3.4 (*)    CO2 20 (*)    Glucose, Bld 151 (*)    BUN 22 (*)    Calcium 10.4 (*)    All other components within normal limits  CBC - Abnormal; Notable for the following:    WBC 18.9 (*)    MCHC 36.2 (*)    All other components within normal limits  I-STAT TROPOININ, ED    EKG  EKG Interpretation  Date/Time:  Friday April 05 2016 18:03:51 EST Ventricular Rate:  108 PR Interval:  120 QRS Duration: 88 QT Interval:  324 QTC Calculation: 434 R  Axis:   88 Text Interpretation:  Sinus tachycardia Possible Left atrial enlargement Borderline ECG Rate faster Confirmed by Wyvonnia Dusky  MD, STEPHEN [phone]) on 04/06/2016 12:40:27 AM       Radiology Dg Chest 2 View  Result Date: 04/05/2016 CLINICAL DATA:  Acute onset of mid and right-sided chest pain. Shortness of breath Initial encounter. EXAM: CHEST  2 VIEW COMPARISON:  Chest radiograph performed 03/29/2016 FINDINGS: The lungs are well-aerated and clear. There is no evidence of focal opacification, pleural effusion or pneumothorax. The heart is normal in size; the mediastinal contour is within normal limits. No acute osseous abnormalities are seen. Clips are noted within the right upper quadrant, reflecting prior cholecystectomy. IMPRESSION: No acute cardiopulmonary process seen. Electronically Signed   By: Garald Balding M.D.   On: 04/05/2016 18:28   Ct Chest W Contrast  Result Date: 04/06/2016 CLINICAL DATA:  Subacute onset of generalized chest pain. Initial encounter. EXAM: CT CHEST WITH CONTRAST TECHNIQUE: Multidetector CT imaging of the chest was  performed during intravenous contrast administration. CONTRAST:  41mL ISOVUE-300 IOPAMIDOL (ISOVUE-300) INJECTION 61% COMPARISON:  Chest radiograph performed 04/05/2016, and MRI of the thoracic spine performed 10/28/2011 FINDINGS: Cardiovascular: The heart is normal in size. The thoracic aorta is unremarkable. The great vessels are within normal limits. No calcific atherosclerotic disease is seen. Mediastinum/Nodes: The mediastinum is unremarkable in appearance. No mediastinal lymphadenopathy is seen. No pericardial effusion is identified. The visualized portions of the thyroid gland are unremarkable. No axillary lymphadenopathy is seen. Lungs/Pleura: The lungs are clear bilaterally. No focal consolidation, pleural effusion or pneumothorax is seen. No masses are identified. Upper Abdomen: The visualized portions of the liver and spleen are grossly unremarkable. The patient is status post cholecystectomy, with clips noted at the gallbladder fossa. The visualized portions of the pancreas, adrenal glands and kidneys are within normal limits. Musculoskeletal: No acute osseous abnormalities are identified. The visualized musculature is unremarkable in appearance. IMPRESSION: Unremarkable contrast-enhanced CT of the chest. Electronically Signed   By: Garald Balding M.D.   On: 04/06/2016 01:45    Procedures Procedures (including critical care time)  Medications Ordered in ED Medications  sodium chloride 0.9 % bolus 1,000 mL (0 mLs Intravenous Stopped 04/06/16 0130)  iopamidol (ISOVUE-300) 61 % injection (75 mLs  Contrast Given 04/06/16 0117)  ketorolac (TORADOL) 30 MG/ML injection 30 mg (30 mg Intravenous Given 04/06/16 0153)     Initial Impression / Assessment and Plan / ED Course  I have reviewed the triage vital signs and the nursing notes.  Pertinent labs & imaging results that were available during my care of the patient were reviewed by me and considered in my medical decision making (see chart for  details).  41 year old male presents with ongoing chest pain. He is non-toxic appearing. Patient is afebrile, not tachycardic or tachypneic, normotensive, and not hypoxic. He denies abdominal pain is non-tender on exam so doubt gastritis/GERD. LFTs and Lipase a week ago were normal. Labs today are remarkable for leukocytosis of 18.9 - he is currently on steroids so possibly reactive. BMP remarkable for hyperglycemia and mildly elevated SCr and Ca. IVF and pain medicine given in ED. CT chest ordered since this is his 4th presentation for this.  CT chest negative. Discussed results with patient. Advised follow up with PCP. Return precautions given.  Final Clinical Impressions(s) / ED Diagnoses   Final diagnoses:  Chest wall pain    New Prescriptions New Prescriptions   No medications on file  Recardo Evangelist, PA-C 04/06/16 [id number]    Ezequiel Essex, MD 04/06/16 [phone]

## 2016-04-06 NOTE — ED Notes (Signed)
Pt and family verbalized understanding of d/c instructions.  

## 2016-04-07 ENCOUNTER — Encounter: Payer: Self-pay | Admitting: Family Medicine

## 2016-04-08 ENCOUNTER — Encounter: Payer: BLUE CROSS/BLUE SHIELD | Admitting: Physical Therapy

## 2016-04-08 ENCOUNTER — Telehealth: Payer: Self-pay | Admitting: *Deleted

## 2016-04-08 NOTE — Telephone Encounter (Signed)
Received fax from [facility] requesting PA for Lidocaine Patch.  PA completed on CoverMyMeds.  PA was denied.  [facility] notified via fax.

## 2016-04-08 NOTE — Telephone Encounter (Signed)
F/u with me on Wed. This is the patient we just discussed.

## 2016-04-08 NOTE — Telephone Encounter (Signed)
Patient's wife called this morning (unsure of the time in relation to your response today at 10:35am)    But wife is requesting that Dr. Lorelei Pont call them back and discuss what is going on with patient.  He has been seen multiple times for his complaints and in and out of the Er.    Note: appointment with Dr. Lorelei Pont for 04/10/16 at 9:00am for consultation.

## 2016-04-09 NOTE — Telephone Encounter (Signed)
This is denied - they always deny this unless it is for pain after shingles.   You can get almost the same thing over the counter now for 5-6 dollars a bottle.   Aspercreme with 4% Lidocaine. There are other brands, but the 4% lidocaine is what you want.  You can basically use it as much as you want.    There have been so many messages and calls in the last couple of days I am confused. I will go over everything in great detail tomorrow at 9 AM and come up with a definite plan of care. I have reviewed all the notes, labs, and imaging now.  Amy, thanks for your help.

## 2016-04-10 ENCOUNTER — Ambulatory Visit (INDEPENDENT_AMBULATORY_CARE_PROVIDER_SITE_OTHER): Payer: BLUE CROSS/BLUE SHIELD | Admitting: Family Medicine

## 2016-04-10 ENCOUNTER — Encounter: Payer: Self-pay | Admitting: Family Medicine

## 2016-04-10 ENCOUNTER — Other Ambulatory Visit: Payer: Self-pay | Admitting: Family Medicine

## 2016-04-10 ENCOUNTER — Telehealth: Payer: Self-pay | Admitting: Family Medicine

## 2016-04-10 VITALS — BP 110/84 | HR 83 | Temp 97.5°F | Ht 68.0 in | Wt 158.2 lb

## 2016-04-10 DIAGNOSIS — R079 Chest pain, unspecified: Secondary | ICD-10-CM | POA: Diagnosis not present

## 2016-04-10 DIAGNOSIS — T1590XA Foreign body on external eye, part unspecified, unspecified eye, initial encounter: Secondary | ICD-10-CM

## 2016-04-10 DIAGNOSIS — M5412 Radiculopathy, cervical region: Secondary | ICD-10-CM

## 2016-04-10 DIAGNOSIS — M5414 Radiculopathy, thoracic region: Secondary | ICD-10-CM

## 2016-04-10 DIAGNOSIS — R2990 Unspecified symptoms and signs involving the nervous system: Secondary | ICD-10-CM | POA: Diagnosis not present

## 2016-04-10 NOTE — Progress Notes (Signed)
Dr. Frederico Hamman T. Pascha Fogal, MD, [facility] [place], [address] Phone: [phone] Fax: [phone]  04/10/2016  Patient: Raymond [name], MRN: [id number], DOB: December 08, 1975, 41 y.o.  Primary Physician:  [name], MD   Chief Complaint  Patient presents with  . Follow-up    ED visit   Subjective:   Raymond [name] is a 41 y.o. very pleasant male patient who presents with the following:  Multiple ER follow-ups.  Multiple issues.  Since 03/26/2016, the patient has been to the ER 3 times and S/p OV with Dr. B x 2. I saw him 03/11/2016 with cervical radiculopathy and attempted to consult Dr. Joya Salm who he had seen before, but he had retired.   Right now he is having severe neck pain, back pain, and is having bilateral radicular symptoms down both arms. He is having some weakness in his right bicep, and he is having tingling and some altered sensation bilaterally in both arms.  Was trying to stretch out his pec and chest and now having a lot of pain up in his chest. He has been short of breath and having pain with taking a deep breath. He is having some difficulty walking and ambulating. He had 1 set of negative cardiac enzymes, normal EKGs, and he also has had a negative CT angiogram of his chest. His personal history is significant for an aborted cardiac arrest as well as extensive family cardiac history.  Also complaining - midback is also bothering him. He also is having some severe pain in his mid back as well, much worse and he has ever had previously. He is also having some chest pain, and he feels like all this began when he felt something slip in his back several weeks ago.  He is normally quite functional, but in the last 2 weeks he has been unable to work at all.  Upper shoulders, neck and chest are #1 concern.  Having some shortness of breath.  Arms are knotting up.   Not eating much, but drinking liquids.    Generally he feels not himself, and he has not been the best.  He has had some indigestion. He has been taking oral prednisone. Prior to this he was taking some other NSAIDs.  Past Medical History, Surgical History, Social History, Family History, Problem List, Medications, and Allergies have been reviewed and updated if relevant.  Patient Active Problem List   Diagnosis Date Noted  . Chest wall pain 03/28/2016  . 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 Hypertension 04/06/2013  . Allergic rhinitis 01/18/2013  . Family history of long QT syndrome 06/12/2012  . 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 Smoker 06/12/2012  . Common migraine 05/26/2012  . Neck pain 05/26/2012  . Fibromyalgia 05/26/2012    Past Medical History:  Diagnosis Date  . Biceps tendonitis on right 01/2014  . History of gastric ulcer    summer 2015  . Hypertension    has not been taking his medication; advised to start back on med. today  . 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 Shoulder impingement 01/2014   right    Past Surgical History:  Procedure Laterality Date  . CHOLECYSTECTOMY    . RESECTION DISTAL CLAVICAL Right 02/21/2014   Procedure: RESECTION DISTAL CLAVICAL;  Surgeon: Nita Sells, MD;  Location: [facility];  Service: Orthopedics;  Laterality: Right;  . SHOULDER ARTHROSCOPY WITH  SUBACROMIAL DECOMPRESSION Right 02/21/2014   Procedure: SHOULDER ARTHROSCOPY WITH SUBACROMIAL DECOMPRESSION, DISTAL CLAVICAL EXCISION, DEBRIDIMENT OF PARTIAL ROTATOR CUFF TEAR;  Surgeon: Nita Sells, MD;  Location: [facility];  Service: Orthopedics;  Laterality: Right;  Right shoulder arthroscopy subacromial decompression, distal clavical excision    Social History   Social History  . Marital status: Married    Spouse name: N/A  . Number of children: N/A  . Years of education: N/A   Occupational History   . electrician Programme researcher, broadcasting/film/video   Social History Main Topics  . Smoking status: Former Smoker    Quit date: 09/24/2013  . Smokeless tobacco: Never Used  . Alcohol use No  . Drug use: No  . Sexual activity: Not on file   Other Topics Concern  . Not on file   Social History Narrative   Mom was in hospital with long QT syndrome, then heart in A Fib.   Sister has long QT syndrome   1st cousin: long QT syndrome          Family History  Problem Relation Age of Onset  . Diabetes Mother   . Heart disease Mother   . Lung disease Father   . Diabetes Father     Allergies  Allergen Reactions  . Azithromycin Nausea And Vomiting  . Percocet [Oxycodone-Acetaminophen] Hives  . Vicodin [Hydrocodone-Acetaminophen] Hives  . Adhesive [Tape] Other (See Comments)    SKIN IRRITATION    Medication list reviewed and updated in full in [facility].   GEN: No acute illnesses, no fevers, chills. GI: No n/v/d, eating normally Pulm: + SOB + chest pain Neuro and msk as above Otherwise, the pertinent positives and negatives are listed above and in the HPI, otherwise a full review of systems has been reviewed and is negative unless noted positive.   Objective:   BP 110/84   Pulse 83   Temp 97.5 F (36.4 C) (Oral)   Ht 5\' 8"  (1.727 m)   Wt 158 lb 4 oz (71.8 kg)   BMI 24.06 kg/m   GEN: WDWN, NAD, Non-toxic, A & O x 3 HEENT: Atraumatic, Normocephalic. Neck supple. No masses, No LAD. Ears and Nose: No external deformity. CV: RRR, No M/G/R. No JVD. No thrill. No extra heart sounds. PULM: CTA B, no wheezes, crackles, rhonchi. No retractions. No resp. distress. No accessory muscle use. Upper chest wall is tender to palpation, but mostly with stretching of the pectoralis minor musculature. EXTR: No c/c/e NEURO Normal gait.  PSYCH: Normally interactive. Conversant. Not depressed or anxious appearing.  Calm demeanor.    CERVICAL SPINE EXAM Range of motion: Flexion, extension, lateral  bending, and rotation: Flexion and extension with approximately 10% loss of motion and lateral bending 40% loss of motion with rotational movements full. Pain with terminal motion: In all directions Spinous Processes: NT SCM: NT Upper paracervical muscles: ttp Upper traps: ttp C5-T1 intact, sensation and motor - we did exception of some minor decreased 4 out of 5 strength in biceps flexion on the right. There is some tingling associated bilaterally but sensation appears to be grossly preserved.  Shoulder: B Inspection: No muscle wasting or winging Ecchymosis/edema: neg  AC joint, scapula, clavicle: NT Abduction: full, 5/5 Flexion: full, 5/5 IR, full, lift-off: 5/5 ER at neutral: full, 5/5 AC crossover and compression: neg Neer: neg Hawkins: neg Drop Test: neg Empty Can: neg Supraspinatus insertion: NT Bicipital groove: NT Speed's: neg Yergason's: neg Sulcus sign: neg Scapular dyskinesis: none  Thoracic spine is markedly tender throughout all musculature as well as spinous processes. Patient all has some reported tingling in altered sensations on examination as well.  The lower back is less tender relatively compared to thoracic and cervical spine.  Laboratory and Imaging Data:Dg Chest 2 View  Result Date: 04/05/2016 CLINICAL DATA:  Acute onset of mid and right-sided chest pain. Shortness of breath Initial encounter. EXAM: CHEST  2 VIEW COMPARISON:  Chest radiograph performed 03/29/2016 FINDINGS: The lungs are well-aerated and clear. There is no evidence of focal opacification, pleural effusion or pneumothorax. The heart is normal in size; the mediastinal contour is within normal limits. No acute osseous abnormalities are seen. Clips are noted within the right upper quadrant, reflecting prior cholecystectomy. IMPRESSION: No acute cardiopulmonary process seen. Electronically Signed   By: Garald Balding M.D.   On: 04/05/2016 18:28   Dg Chest 2 View  Result Date: 03/29/2016 CLINICAL  DATA:  Chest pain. EXAM: CHEST  2 VIEW COMPARISON:  03/25/2016 FINDINGS: The heart size and mediastinal contours are within normal limits. There is no evidence of pulmonary edema, consolidation, pneumothorax, nodule or pleural fluid. The visualized skeletal structures are unremarkable. IMPRESSION: No active cardiopulmonary disease. Electronically Signed   By: Aletta Edouard M.D.   On: 03/29/2016 13:13   Dg Chest 2 View  Result Date: 03/25/2016 CLINICAL DATA:  Chest pain for 2 hours.  Hypertension. EXAM: CHEST  2 VIEW COMPARISON:  None. FINDINGS: The heart size and mediastinal contours are within normal limits. Both lungs are clear. The visualized skeletal structures are unremarkable. IMPRESSION: No active cardiopulmonary disease. Electronically Signed   By: Ashley Royalty M.D.   On: 03/25/2016 23:40   Dg Cervical Spine Complete  Result Date: 04/02/2016 CLINICAL DATA:  acute worsening in neck pain, radiation of pain bilaterally, pt felt something slip on 03/21/2016 EXAM: CERVICAL SPINE - COMPLETE 4+ VIEW COMPARISON:  None. FINDINGS: No fracture.  No spondylolisthesis.  No bone lesion. The disc spaces and neural foramina are well preserved. No significant degenerative change. There is straightening of the normal cervical lordosis and a mild curvature on the AP view, convex to the right. This may be due to muscle spasm. Soft tissues are unremarkable. IMPRESSION: 1. No fracture, bone lesion or spondylolisthesis. 2. Straightening of the normal cervical lordosis and mild curvature, convex to the right, suggesting muscle spasm. Electronically Signed   By: Lajean Manes M.D.   On: 04/02/2016 15:03   Ct Chest W Contrast  Result Date: 04/06/2016 CLINICAL DATA:  Subacute onset of generalized chest pain. Initial encounter. EXAM: CT CHEST WITH CONTRAST TECHNIQUE: Multidetector CT imaging of the chest was performed during intravenous contrast administration. CONTRAST:  51mL ISOVUE-300 IOPAMIDOL (ISOVUE-300) INJECTION 61%  COMPARISON:  Chest radiograph performed 04/05/2016, and MRI of the thoracic spine performed 10/28/2011 FINDINGS: Cardiovascular: The heart is normal in size. The thoracic aorta is unremarkable. The great vessels are within normal limits. No calcific atherosclerotic disease is seen. Mediastinum/Nodes: The mediastinum is unremarkable in appearance. No mediastinal lymphadenopathy is seen. No pericardial effusion is identified. The visualized portions of the thyroid gland are unremarkable. No axillary lymphadenopathy is seen. Lungs/Pleura: The lungs are clear bilaterally. No focal consolidation, pleural effusion or pneumothorax is seen. No masses are identified. Upper Abdomen: The visualized portions of the liver and spleen are grossly unremarkable. The patient is status post cholecystectomy, with clips noted at the gallbladder fossa. The visualized portions of the pancreas, adrenal glands and kidneys are within normal limits. Musculoskeletal: No acute  osseous abnormalities are identified. The visualized musculature is unremarkable in appearance. IMPRESSION: Unremarkable contrast-enhanced CT of the chest. Electronically Signed   By: Garald Balding M.D.   On: 04/06/2016 01:45   Dg Abd Portable 2 Views  Result Date: 03/29/2016 CLINICAL DATA:  Epigastric pain and diarrhea for 5 days. EXAM: PORTABLE ABDOMEN - 2 VIEW COMPARISON:  CT abdomen and pelvis September 29, 2013 FINDINGS: The bowel gas pattern is normal. There is no evidence of free air. Surgical clips in the included right abdomen compatible with cholecystectomy. No radio-opaque calculi or other significant radiographic abnormality is seen. Phleboliths project in the pelvis. IMPRESSION: Negative. Electronically Signed   By: Elon Alas M.D.   On: 03/29/2016 19:56    Results for orders placed or performed during the hospital encounter of [id number]  Basic metabolic panel  Result Value Ref Range   Sodium 137 135 - 145 mmol/L   Potassium 3.4 (L) 3.5 - 5.1  mmol/L   Chloride 102 101 - 111 mmol/L   CO2 20 (L) 22 - 32 mmol/L   Glucose, Bld 151 (H) 65 - 99 mg/dL   BUN 22 (H) 6 - 20 mg/dL   Creatinine, Ser 1.21 0.61 - 1.24 mg/dL   Calcium 10.4 (H) 8.9 - 10.3 mg/dL   GFR calc non Af Amer >60 >60 mL/min   GFR calc Af Amer >60 >60 mL/min   Anion gap 15 5 - 15  CBC  Result Value Ref Range   WBC 18.9 (H) 4.0 - 10.5 K/uL   RBC 5.51 4.22 - 5.81 MIL/uL   Hemoglobin 16.9 13.0 - 17.0 g/dL   HCT 46.7 39.0 - 52.0 %   MCV 84.8 78.0 - 100.0 fL   MCH 30.7 26.0 - 34.0 pg   MCHC 36.2 (H) 30.0 - 36.0 g/dL   RDW 11.9 11.5 - 15.5 %   Platelets 306 150 - 400 K/uL  I-stat troponin, ED  Result Value Ref Range   Troponin i, poc 0.00 0.00 - 0.08 ng/mL   Comment 3            Lab Results  Component Value Date   LIPASE 29 03/29/2016     Assessment and Plan:   Cervical radiculopathy - Plan: MR CERVICAL SPINE WO CONTRAST  Chest pain in adult - Plan: Ambulatory referral to Cardiology, MR CERVICAL SPINE WO CONTRAST, MR THORACIC SPINE WO CONTRAST  Thoracic radiculopathy - Plan: MR THORACIC SPINE WO CONTRAST  Abnormal neurological exam   >40 minutes spent in face to face time with patient, >50% spent in counselling or coordination of care: Wife provides additional history. The patient has been to the ER and our office 6 times in the last 2 weeks. Markedly decompensated patient.  known history of significant spine disease who has seen in the past neurosurgery as well as physical medicine and rehabilitation with a failure of all conservative measures including epidural steroid injections, physical therapy, neuropathic pain medications, and a failure of all neuropathic pain medications, oral steroids, oral anti-inflammatories, and essentially all known conservative care. He is now doing markedly poorly with bilateral radicular symptoms down both arms, altered sensation as well as motor function, more on the right side. Obtain an MRI of the cervical and thoracic spine  without contrast to evaluate for potential spinal cord edema, nerve root encroachment, spinal stenosis, foraminal stenosis or other acute neurosurgical emergency. Given the patient's global dysfunction currently, plan to consult spine surgery.  With ongoing chest pain, 6 office visits within  2 weeks, history of cardiac disease strong in the family I think that it is reasonable to have a cardiac evaluation, and we will have him see cardiology.  Follow-up: This will depend upon further studies  Orders Placed This Encounter  Procedures  . MR CERVICAL SPINE WO CONTRAST  . MR THORACIC SPINE WO CONTRAST  . Ambulatory referral to Cardiology    Signed,  Frederico Hamman T. Juleon Narang, MD   Allergies as of 04/10/2016      Reactions   Azithromycin Nausea And Vomiting   Percocet [oxycodone-acetaminophen] Hives   Vicodin [hydrocodone-acetaminophen] Hives   Adhesive [tape] Other (See Comments)   SKIN IRRITATION      Medication List       Accurate as of 04/10/16  2:14 PM. Always use your most recent med list.          amphetamine-dextroamphetamine 15 MG tablet Commonly known as:  ADDERALL Take 1 tablet by mouth 2 (two) times daily with a meal.   cetirizine-pseudoephedrine 5-120 MG tablet Commonly known as:  ZYRTEC-D Take 1 tablet by mouth once a week.   Eszopiclone 3 MG Tabs TAKE 1 TABLET BY MOUTH 30 MINUTES PRIOR TO BEDTIME   fluticasone 50 MCG/ACT nasal spray Commonly known as:  FLONASE USE 2 SPRAYS IN EACH NOSTRIL EVERY DAY   hydrochlorothiazide 12.5 MG capsule Commonly known as:  MICROZIDE TAKE ONE CAPSULE BY MOUTH EVERY DAY   lidocaine 5 % Commonly known as:  LIDODERM Place 1 patch onto the skin daily. Remove & Discard patch within 12 hours or as directed by MD   naproxen 500 MG tablet Commonly known as:  NAPROSYN Take 1 tablet (500 mg total) by mouth 2 (two) times daily.   predniSONE 20 MG tablet Commonly known as:  DELTASONE 3 tabs by mouth daily x 3 days, then 2 tabs by  mouth daily x 2 days then 1 tab by mouth daily x 2 days   traMADol 50 MG tablet Commonly known as:  ULTRAM Take 1 tablet (50 mg total) by mouth every 6 (six) hours as needed. for pain   VITAMIN D PO Take 5,000 mg by mouth daily.

## 2016-04-10 NOTE — Telephone Encounter (Signed)
Hailey from [facility] called to say that Dr Vertell Limber declined to see this patient.

## 2016-04-10 NOTE — Patient Instructions (Signed)

## 2016-04-10 NOTE — Progress Notes (Signed)
Pre visit review using our clinic review tool, if applicable. No additional management support is needed unless otherwise documented below in the visit note. 

## 2016-04-11 ENCOUNTER — Ambulatory Visit
Admission: RE | Admit: 2016-04-11 | Discharge: 2016-04-11 | Disposition: A | Discharge status: Home / Self Care | Payer: BLUE CROSS/BLUE SHIELD | Source: Ambulatory Visit | Attending: Family Medicine | Admitting: Family Medicine

## 2016-04-11 ENCOUNTER — Encounter: Payer: BLUE CROSS/BLUE SHIELD | Admitting: Physical Therapy

## 2016-04-11 ENCOUNTER — Telehealth: Payer: Self-pay

## 2016-04-11 DIAGNOSIS — T1590XA Foreign body on external eye, part unspecified, unspecified eye, initial encounter: Secondary | ICD-10-CM

## 2016-04-11 DIAGNOSIS — Z01818 Encounter for other preprocedural examination: Secondary | ICD-10-CM | POA: Diagnosis not present

## 2016-04-11 NOTE — Telephone Encounter (Signed)
[name] notified as instructed by telephone.  

## 2016-04-11 NOTE — Telephone Encounter (Signed)
I am very suspicious from the cervical spine. He is already having bilateral symptoms. Tingling, weakness are already there.   Let's see what his MRI shows and cardiology appointment.

## 2016-04-11 NOTE — Telephone Encounter (Signed)
Raymond [name] said pt text her that pt is concerned because new symptom that lt tricept is very cold. Raymond [name] does not know if any pain in arm or CP or SOB. Pt has appt to see cardiologist on 04/16/16. Dr Diona Browner said if no pain and no severe CP or SOB then send note to PCP. Pt could try putting on sweater to see if that helps warm the arm but if severe CP or SOB that is worse than pt has been experiencing pt should go to ED otherwise pt should keep card appt. Raymond [name] wants cb after Dr Lorelei Pont reviews note and she is aware could be 04/12/16 before cb.

## 2016-04-13 ENCOUNTER — Emergency Department (HOSPITAL_COMMUNITY): Payer: BLUE CROSS/BLUE SHIELD

## 2016-04-13 ENCOUNTER — Encounter (HOSPITAL_COMMUNITY): Payer: Self-pay | Admitting: Nurse Practitioner

## 2016-04-13 ENCOUNTER — Emergency Department (HOSPITAL_COMMUNITY)
Admission: EM | Admit: 2016-04-13 | Discharge: 2016-04-13 | Disposition: A | Discharge status: Home / Self Care | Payer: BLUE CROSS/BLUE SHIELD | Attending: Emergency Medicine | Admitting: Emergency Medicine

## 2016-04-13 DIAGNOSIS — F909 Attention-deficit hyperactivity disorder, unspecified type: Secondary | ICD-10-CM | POA: Diagnosis not present

## 2016-04-13 DIAGNOSIS — M546 Pain in thoracic spine: Secondary | ICD-10-CM | POA: Insufficient documentation

## 2016-04-13 DIAGNOSIS — I10 Essential (primary) hypertension: Secondary | ICD-10-CM | POA: Diagnosis not present

## 2016-04-13 DIAGNOSIS — R079 Chest pain, unspecified: Secondary | ICD-10-CM

## 2016-04-13 DIAGNOSIS — R072 Precordial pain: Secondary | ICD-10-CM | POA: Diagnosis not present

## 2016-04-13 DIAGNOSIS — Z87891 Personal history of nicotine dependence: Secondary | ICD-10-CM | POA: Insufficient documentation

## 2016-04-13 DIAGNOSIS — R05 Cough: Secondary | ICD-10-CM | POA: Diagnosis not present

## 2016-04-13 LAB — BASIC METABOLIC PANEL
Anion gap: 10 (ref 5–15)
BUN: 12 mg/dL (ref 6–20)
CO2: 22 mmol/L (ref 22–32)
Calcium: 9.7 mg/dL (ref 8.9–10.3)
Chloride: 107 mmol/L (ref 101–111)
Creatinine, Ser: 1.02 mg/dL (ref 0.61–1.24)
GFR calc Af Amer: >60 mL/min (ref >60)
GFR calc non Af Amer: >60 mL/min (ref >60)
Glucose, Bld: 93 mg/dL (ref 65–99)
Potassium: 3.7 mmol/L (ref 3.5–5.1)
Sodium: 139 mmol/L (ref 135–145)

## 2016-04-13 LAB — I-STAT TROPONIN, ED: Troponin i, poc: 0 ng/mL (ref 0.00–0.08)

## 2016-04-13 LAB — CBC
HCT: 46.7 % (ref 39.0–52.0)
Hemoglobin: 17.1 g/dL — ABNORMAL HIGH (ref 13.0–17.0)
MCH: 30.8 pg (ref 26.0–34.0)
MCHC: 36.6 g/dL — ABNORMAL HIGH (ref 30.0–36.0)
MCV: 84 fL (ref 78.0–100.0)
Platelets: 278 10*3/uL (ref 150–400)
RBC: 5.56 MIL/uL (ref 4.22–5.81)
RDW: 12.5 % (ref 11.5–15.5)
WBC: 13 10*3/uL — ABNORMAL HIGH (ref 4.0–10.5)

## 2016-04-13 NOTE — ED Provider Notes (Signed)
[facility] DEPT Provider Note   CSN: [id number] Arrival date & time: 04/13/16  1327     History   Chief Complaint Chief Complaint  Patient presents with  . Chest Pain  . GI Problem    HPI Raymond [name] is a 41 y.o. male.  HPI   41 year old male presents today with complaints of chest pain and back pain.  This is patient's fourth visit in the last 6 months for similar presentation.  Patient notes symptoms started at the beginning of February.  He reports pain in his sternum described as "electrocution".  He notes he feels the pain is radiating from his back around to his chest.  Patient notes that symptoms are worse with movement, reports that he is spine is "loose and moving".  Patient denies any acute shortness of breath, reports symptoms are improved with rest, sometimes worse with palpation.  He notes this is localized around the sternum.  She notes when the pain comes on he gets tachycardic and feels as if his heart is going to beat out of his chest.  Patient has seen his primary care for this and is scheduled for an MR cervical and thoracic tomorrow.  Patient also reports that he gets radiation of a cold sensation into his feet and hands.  He notes that this move from his right foot to his left foot, and right hand the left hand.  He notes a similar presentation back in 2013, had an MRI done but states that at the time the MRI was done the symptoms had resolved.  He denies any significant infectious etiology, denies trauma.  Patient reports that today he had dark stools.  Patient notes that he did not take anything for the pain has any medication he has been given in the past has not worked.   Patient's most recent workup was notable for negative CT chest with contrast, and normal troponin.  Patient has also had plain films of his chest, cervical spine, abdomen.   Past Medical History:  Diagnosis Date  . Biceps tendonitis on right 01/2014  . History of gastric ulcer    summer  2015  . Hypertension    has not been taking his medication; advised to start back on med. today  . 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 Shoulder impingement 01/2014   right    Patient Active Problem List   Diagnosis Date Noted  . Chest wall pain 03/28/2016  . 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 Hypertension 04/06/2013  . Allergic rhinitis 01/18/2013  . Family history of long QT syndrome 06/12/2012  . 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 Smoker 06/12/2012  . Common migraine 05/26/2012  . Neck pain 05/26/2012  . Fibromyalgia 05/26/2012    Past Surgical History:  Procedure Laterality Date  . CHOLECYSTECTOMY    . RESECTION DISTAL CLAVICAL Right 02/21/2014   Procedure: RESECTION DISTAL CLAVICAL;  Surgeon: Nita Sells, MD;  Location: [facility];  Service: Orthopedics;  Laterality: Right;  . SHOULDER ARTHROSCOPY WITH SUBACROMIAL DECOMPRESSION Right 02/21/2014   Procedure: SHOULDER ARTHROSCOPY WITH SUBACROMIAL DECOMPRESSION, DISTAL CLAVICAL EXCISION, DEBRIDIMENT OF PARTIAL ROTATOR CUFF TEAR;  Surgeon: Nita Sells, MD;  Location: [facility];  Service: Orthopedics;  Laterality: Right;  Right shoulder arthroscopy subacromial decompression, distal clavical excision       Home Medications    Prior to Admission medications   Medication Sig Start Date End Date Taking? Authorizing Provider  amphetamine-dextroamphetamine (ADDERALL) 15 MG tablet Take 1 tablet by mouth 2 (two) times daily with a meal. Patient taking differently: Take 15 mg by mouth 2 (two) times daily as needed (ADHD).  12/13/15   [name], MD  cetirizine-pseudoephedrine (ZYRTEC-D) 5-120 MG per tablet Take 1 tablet by mouth once a week.     Historical Provider, MD  Cholecalciferol (VITAMIN D PO) Take 5,000 mg by mouth daily.    Historical Provider, MD  Eszopiclone 3 MG TABS TAKE 1 TABLET BY MOUTH 30  MINUTES PRIOR TO BEDTIME Patient taking differently: TAKE 1 TABLET BY MOUTH 30 MINUTES PRIOR TO BEDTIME AS NEEDED FOR SLEEP 06/07/15   [name], MD  fluticasone (FLONASE) 50 MCG/ACT nasal spray USE 2 SPRAYS IN EACH NOSTRIL EVERY DAY Patient taking differently: USE 2 SPRAYS IN EACH NOSTRIL EVERY DAY AS NEEDED FOR ALLERGIES 03/17/15   [name], MD  hydrochlorothiazide (MICROZIDE) 12.5 MG capsule TAKE ONE CAPSULE BY MOUTH EVERY DAY 11/24/14   [name], MD  lidocaine (LIDODERM) 5 % Place 1 patch onto the skin daily. Remove & Discard patch within 12 hours or as directed by MD 04/05/16   Jinny Sanders, MD  naproxen (NAPROSYN) 500 MG tablet Take 1 tablet (500 mg total) by mouth 2 (two) times daily. 03/26/16   [name], MD  predniSONE (DELTASONE) 20 MG tablet 3 tabs by mouth daily x 3 days, then 2 tabs by mouth daily x 2 days then 1 tab by mouth daily x 2 days 04/02/16   Jinny Sanders, MD  traMADol (ULTRAM) 50 MG tablet Take 1 tablet (50 mg total) by mouth every 6 (six) hours as needed. for pain 04/02/16   Jinny Sanders, MD    Family History Family History  Problem Relation Age of Onset  . Diabetes Mother   . Heart disease Mother   . Lung disease Father   . Diabetes Father     Social History Social History  Substance Use Topics  . Smoking status: Former Smoker    Quit date: 09/24/2013  . Smokeless tobacco: Never Used  . Alcohol use No    Allergies   Azithromycin; Percocet [oxycodone-acetaminophen]; Vicodin [hydrocodone-acetaminophen]; and Adhesive [tape]   Review of Systems Review of Systems  All other systems reviewed and are negative.   Physical Exam Updated Vital Signs BP 121/97   Pulse 73   Temp 98.7 F (37.1 C) (Oral)   Resp 16   SpO2 96%   Physical Exam  Constitutional: He is oriented to person, place, and time. He appears well-developed and well-nourished.  HENT:  Head: Normocephalic and atraumatic.  Eyes: Conjunctivae are normal. Pupils are equal, round,  and reactive to light. Right eye exhibits no discharge. Left eye exhibits no discharge. No scleral icterus.  Neck: Normal range of motion. No JVD present. No tracheal deviation present.  Pulmonary/Chest: Effort normal. No stridor.  Abdominal: Soft. He exhibits no distension. There is no tenderness.  Musculoskeletal:  No CT or L-spine tenderness, no signs of infectious etiology.  Minor tenderness to the upper thoracic surrounding soft tissue  Bilateral upper and lower extremity strength 5 out of 5, sensation intact, warm and well-perfused  Neurological: He is alert and oriented to person, place, and time. Coordination normal.  Psychiatric: He has a normal mood and affect. His behavior is normal. Judgment and thought content normal.  Nursing note and vitals reviewed.    ED Treatments / Results  Labs (all labs ordered are listed, but only abnormal  results are displayed) Labs Reviewed  CBC - Abnormal; Notable for the following:       Result Value   WBC 13.0 (*)    Hemoglobin 17.1 (*)    MCHC 36.6 (*)    All other components within normal limits  BASIC METABOLIC PANEL  I-STAT TROPOININ, ED    EKG  EKG Interpretation  Date/Time:  Saturday April 13 2016 13:36:45 EST Ventricular Rate:  88 PR Interval:  114 QRS Duration: 88 QT Interval:  356 QTC Calculation: 430 R Axis:   -26 Text Interpretation:  Normal sinus rhythm Left ventricular hypertrophy Abnormal ECG since previous tracing, rate has improved Confirmed by LITTLE MD, RACHEL [phone]) on 04/13/2016 5:16:17 PM       Radiology Dg Chest 2 View  Result Date: 04/13/2016 CLINICAL DATA:  Reason for exam: pt c/o of midsternal CP (radiating to both shoulders) that has been going on for about a month; SOB; denies fever; denies cough. Medical hx: HTN, past smoker (quit 3 years ago). EXAM: CHEST  2 VIEW COMPARISON:  04/05/2016 FINDINGS: The heart size and mediastinal contours are within normal limits. Both lungs are clear. No pleural  effusion or pneumothorax. The visualized skeletal structures are unremarkable. IMPRESSION: No active cardiopulmonary disease. Electronically Signed   By: Lajean Manes M.D.   On: 04/13/2016 17:56    Procedures Procedures (including critical care time)  Medications Ordered in ED Medications - No data to display   Initial Impression / Assessment and Plan / ED Course  I have reviewed the triage vital signs and the nursing notes.  Pertinent labs & imaging results that were available during my care of the patient were reviewed by me and considered in my medical decision making (see chart for details).     Final Clinical Impressions(s) / ED Diagnoses   Final diagnoses:  Chest pain, unspecified type    Labs: BMP, CBC and i-STAT troponin  Imaging: DG chest 2 view  Consults:  Therapeutics:  Discharge Meds:   Assessment/Plan: 42 year old male presents today with complaints of chest and back pain.  Patient has had numerous workups here.  He does not have tenderness to palpation of chest now, reports this is tender at home.  He is also having back pain which could likely be causing referred pain.  I have very low suspicion for ACS in this patient, patient is PERC negative and has no signs or symptoms consistent with PE.  Patient has follow-up MRI tomorrow.  He is encouraged to follow-up with MRI study, see his primary care provider for ongoing management.  He is instructed to return for any new or worsening signs or symptoms.  Patient also notes dark stools, he has normal hemoglobin, low suspicion for acute GI pathology.  He will follow up with primary care for further evaluation of this.  He verbalizes understanding and agreement to this plan and had no further questions or concerns at time of discharge   New Prescriptions New Prescriptions   No medications on file     Okey Regal, PA-C 04/13/16 1810    Virgel Manifold, MD 04/29/16 1424

## 2016-04-13 NOTE — ED Notes (Signed)
Pt returned from xray and connected to the monitor 

## 2016-04-13 NOTE — Discharge Instructions (Signed)
Please read attached information. If you experience any new or worsening signs or symptoms please return to the emergency room for evaluation. Please follow-up with your primary care provider or specialist as discussed.  °

## 2016-04-13 NOTE — ED Notes (Signed)
Patient transported to X-ray 

## 2016-04-13 NOTE — ED Triage Notes (Addendum)
Pt presents with c/o cp and dark stools. The chest pain began about 1 month ago. He has been to ER  and PCP several times for this pain over the past month. He is scheduled for MRI of of neck and spine tomorrow for the pain as he has had many other tests that have been normal. Today he had an episode of diarrhea that looked black and afterwards the chest pain returned along with shortness of breath and racing heart so he decided to return to ED for further evaluation. he has had some nausea since last night. He denies vomiting, abdominal pain. He has not been on any medication for the pain and has not been taking his daily medications for the past several weeks.

## 2016-04-14 ENCOUNTER — Ambulatory Visit
Admission: RE | Admit: 2016-04-14 | Discharge: 2016-04-14 | Disposition: A | Discharge status: Home / Self Care | Payer: BLUE CROSS/BLUE SHIELD | Source: Ambulatory Visit | Attending: Family Medicine | Admitting: Family Medicine

## 2016-04-14 DIAGNOSIS — M50221 Other cervical disc displacement at C4-C5 level: Secondary | ICD-10-CM | POA: Diagnosis not present

## 2016-04-14 DIAGNOSIS — R079 Chest pain, unspecified: Secondary | ICD-10-CM

## 2016-04-14 DIAGNOSIS — M5414 Radiculopathy, thoracic region: Secondary | ICD-10-CM

## 2016-04-14 DIAGNOSIS — M50222 Other cervical disc displacement at C5-C6 level: Secondary | ICD-10-CM | POA: Diagnosis not present

## 2016-04-14 DIAGNOSIS — M5412 Radiculopathy, cervical region: Secondary | ICD-10-CM

## 2016-04-14 DIAGNOSIS — M5124 Other intervertebral disc displacement, thoracic region: Secondary | ICD-10-CM | POA: Diagnosis not present

## 2016-04-15 ENCOUNTER — Encounter: Payer: BLUE CROSS/BLUE SHIELD | Admitting: Physical Therapy

## 2016-04-15 ENCOUNTER — Ambulatory Visit: Payer: Self-pay | Admitting: Family Medicine

## 2016-04-16 ENCOUNTER — Ambulatory Visit (INDEPENDENT_AMBULATORY_CARE_PROVIDER_SITE_OTHER): Payer: BLUE CROSS/BLUE SHIELD | Admitting: Cardiology

## 2016-04-16 ENCOUNTER — Encounter: Payer: Self-pay | Admitting: Cardiology

## 2016-04-16 VITALS — BP 124/80 | HR 70 | Ht 68.0 in | Wt 153.0 lb

## 2016-04-16 DIAGNOSIS — I10 Essential (primary) hypertension: Secondary | ICD-10-CM

## 2016-04-16 DIAGNOSIS — R079 Chest pain, unspecified: Secondary | ICD-10-CM | POA: Diagnosis not present

## 2016-04-16 DIAGNOSIS — R0602 Shortness of breath: Secondary | ICD-10-CM

## 2016-04-16 NOTE — Patient Instructions (Addendum)
Testing/Procedures: Your physician has requested that you have an echocardiogram. Echocardiography is a painless test that uses sound waves to create images of your heart. It provides your doctor with information about the size and shape of your heart and how well your heart's chambers and valves are working. This procedure takes approximately one hour. There are no restrictions for this procedure.  Your physician has requested that you have a stress echocardiogram. For further information please visit [email or web address]. Please follow instruction sheet as given.   Do not drink or eat foods with caffeine for 24 hours before the test. (Chocolate, coffee, tea, or energy drinks)  If you use an inhaler, bring it with you to the test.  Do not smoke for 4 hours before the test.  Wear comfortable shoes and clothing.  Follow-Up: Your physician recommends that you schedule a follow-up appointment as needed. We will call you with results and if needed schedule follow up at that time.   It was a pleasure seeing you today here in the office. Please do not hesitate to give us a call back if you have any further questions. [phone]  Amantha Sklar A. RN, BSN    Echocardiogram An echocardiogram, or echocardiography, uses sound waves (ultrasound) to produce an image of your heart. The echocardiogram is simple, painless, obtained within a short period of time, and offers valuable information to your health care provider. The images from an echocardiogram can provide information such as:  Evidence of coronary artery disease (CAD).  Heart size.  Heart muscle function.  Heart valve function.  Aneurysm detection.  Evidence of a past heart attack.  Fluid buildup around the heart.  Heart muscle thickening.  Assess heart valve function. Tell a health care provider about:  Any allergies you have.  All medicines you are taking, including vitamins, herbs, eye drops, creams, and over-the-counter  medicines.  Any problems you or family members have had with anesthetic medicines.  Any blood disorders you have.  Any surgeries you have had.  Any medical conditions you have.  Whether you are pregnant or may be pregnant. What happens before the procedure? No special preparation is needed. Eat and drink normally. What happens during the procedure?  In order to produce an image of your heart, gel will be applied to your chest and a wand-like tool (transducer) will be moved over your chest. The gel will help transmit the sound waves from the transducer. The sound waves will harmlessly bounce off your heart to allow the heart images to be captured in real-time motion. These images will then be recorded.  You may need an IV to receive a medicine that improves the quality of the pictures. What happens after the procedure? You may return to your normal schedule including diet, activities, and medicines, unless your health care provider tells you otherwise. This information is not intended to replace advice given to you by your health care provider. Make sure you discuss any questions you have with your health care provider. Document Released: 02/02/2000 Document Revised: 09/23/2015 Document Reviewed: 10/12/2012 Elsevier Interactive Patient Education  2017 [employer]  Exercise Stress Echocardiogram An exercise stress echocardiogram is a test that checks how well your heart is working. For this test, you will walk on a treadmill to make your heart beat faster. This test uses sound waves (ultrasound) and a computer to make pictures (images) of your heart. These pictures will be taken before you exercise and after you exercise. What happens before the procedure?  Follow instructions   from your doctor about what you cannot eat or drink before the test.  Do not drink or eat anything that has caffeine in it. Stop having caffeine for 24 hours before the test.  Ask your doctor about changing or  stopping your normal medicines. This is important if you take diabetes medicines or blood thinners. Ask your doctor if you should take your medicines with water before the test.  If you use an inhaler, bring it to the test.  Do not use any products that have nicotine or tobacco in them, such as cigarettes and e-cigarettes. Stop using them for 4 hours before the test. If you need help quitting, ask your doctor.  Wear comfortable shoes and clothing. What happens during the procedure?  You will be hooked up to a TV screen. Your doctor will watch the screen to see how fast your heart beats during the test.  Before you exercise, a computer will make a picture of your heart. To do this:  A gel will be put on your chest.  A wand will be moved over the gel.  Sound waves from the wand will go to the computer to make the picture.  Your will start walking on a treadmill. The treadmill will start at a slow speed. It will get faster a little bit at a time. When you walk faster, your heart will beat faster.  The treadmill will be stopped when your heart is working hard.  You will lie down right away so another picture of your heart can be taken.  The test will take 30-60 minutes. What happens after the procedure?  Your heart rate and blood pressure will be watched after the test.  If your doctor says that you can, you may:  Eat what you usually eat.  Do your normal activities.  Take medicines like normal. Summary  An exercise stress echocardiogram is a test that checks how well your heart is working.  Follow instructions about what you cannot eat or drink before the test. Ask your doctor if you should take your normal medicines before the test.  Stop having caffeine for 24 hours before the test. Do not use anything with nicotine or tobacco in it for 4 hours before the test.  A computer will take a picture of your heart before you walk on a treadmill. It will take another picture when  you are done walking.  Your heart rate and blood pressure will be watched after the test. This information is not intended to replace advice given to you by your health care provider. Make sure you discuss any questions you have with your health care provider. Document Released: 12/02/2008 Document Revised: 10/29/2015 Document Reviewed: 10/29/2015 Elsevier Interactive Patient Education  2017 [employer]  

## 2016-04-16 NOTE — Progress Notes (Signed)
Cardiology Office Note   Date:  04/16/2016   ID:  Raymond [name], DOB October 16, 1975, MRN [id number]  Referring Doctor:  [name], MD   Cardiologist:   Wende Bushy, MD   Reason for consultation:  Chief Complaint  Patient presents with  . OTHER    F/u ED due to chest pain c/o elevated BP. Meds reviewed verbally with pt.      History of Present Illness: Raymond [name] is a 41 y.o. male who presents for Chest pain  PCP is sending him here for atypical chest pain, mainly to rule out a cardiac component. His chest pain started actually from the neck radiating to the chest, sharp pains, mild to moderate in intensity, randomly occurring, lasting a few minutes at a time. He has had them ongoing for quite some time now many months. From the chest, does not radiate any further. During these episodes, his blood pressure will go up. Also has some shortness of breath with exertion.  Patient was seen by EP in 2014 as a workup or screening for family members. Mother and it appears to be a sister had long QT. Mother ended up with an ICD. Patient himself did not have any episodes of syncope. He may have had an aborted event as a one month old infant, details unknown.  Patient denies PND, orthopnea, edema, palpitations, syncope.   ROS:  Please see the history of present illness. Aside from mentioned under HPI, all other systems are reviewed and negative.     Past Medical History:  Diagnosis Date  . Biceps tendonitis on right 01/2014  . History of gastric ulcer    summer 2015  . Hypertension    has not been taking his medication; advised to start back on med. today  . 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 Shoulder impingement 01/2014   right    Past Surgical History:  Procedure Laterality Date  . CHOLECYSTECTOMY    . RESECTION DISTAL CLAVICAL Right 02/21/2014   Procedure: RESECTION DISTAL CLAVICAL;  Surgeon: Nita Sells, MD;  Location: [facility];  Service: Orthopedics;  Laterality: Right;  . SHOULDER ARTHROSCOPY WITH SUBACROMIAL DECOMPRESSION Right 02/21/2014   Procedure: SHOULDER ARTHROSCOPY WITH SUBACROMIAL DECOMPRESSION, DISTAL CLAVICAL EXCISION, DEBRIDIMENT OF PARTIAL ROTATOR CUFF TEAR;  Surgeon: Nita Sells, MD;  Location: [place];  Service: Orthopedics;  Laterality: Right;  Right shoulder arthroscopy subacromial decompression, distal clavical excision     reports that he quit smoking about 2 years ago. He has never used smokeless tobacco. He reports that he does not drink alcohol or use drugs.   family history includes Diabetes in his father and mother; Heart disease in his mother; Long QT syndrome in his mother; Lung disease in his father.   Outpatient Medications Prior to Visit  Medication Sig Dispense Refill  . cetirizine-pseudoephedrine (ZYRTEC-D) 5-120 MG per tablet Take 1 tablet by mouth once a week.     . Cholecalciferol (VITAMIN D PO) Take 5,000 mg by mouth daily.    . Eszopiclone 3 MG TABS TAKE 1 TABLET BY MOUTH 30 MINUTES PRIOR TO BEDTIME (Patient taking differently: TAKE 1 TABLET BY MOUTH 30 MINUTES PRIOR TO BEDTIME AS NEEDED FOR SLEEP) 30 tablet 3  . fluticasone (FLONASE) 50 MCG/ACT nasal spray USE 2 SPRAYS IN EACH NOSTRIL EVERY DAY (Patient taking differently: USE 2 SPRAYS IN EACH NOSTRIL EVERY DAY AS NEEDED FOR ALLERGIES) 16 g 5  . hydrochlorothiazide (MICROZIDE)  12.5 MG capsule TAKE ONE CAPSULE BY MOUTH EVERY DAY 30 capsule 5  . lidocaine (LIDODERM) 5 % Place 1 patch onto the skin daily. Remove & Discard patch within 12 hours or as directed by MD 30 patch 0  . naproxen (NAPROSYN) 500 MG tablet Take 1 tablet (500 mg total) by mouth 2 (two) times daily. 30 tablet 0  . amphetamine-dextroamphetamine (ADDERALL) 15 MG tablet Take 1 tablet by mouth 2 (two) times daily with a meal. (Patient not taking: Reported on 04/16/2016) 60 tablet 0  . predniSONE (DELTASONE) 20 MG tablet 3 tabs by mouth  daily x 3 days, then 2 tabs by mouth daily x 2 days then 1 tab by mouth daily x 2 days (Patient not taking: Reported on 04/16/2016) 15 tablet 0  . traMADol (ULTRAM) 50 MG tablet Take 1 tablet (50 mg total) by mouth every 6 (six) hours as needed. for pain (Patient not taking: Reported on 04/16/2016) 20 tablet 0   No facility-administered medications prior to visit.      Allergies: Azithromycin; Percocet [oxycodone-acetaminophen]; Vicodin [hydrocodone-acetaminophen]; and Adhesive [tape]    PHYSICAL EXAM: VS:  BP 124/80 (BP Location: Right Arm, Patient Position: Sitting, Cuff Size: Normal)   Pulse 70   Ht 5\' 8"  (1.727 m)   Wt 153 lb (69.4 kg)   BMI 23.26 kg/m  , Body mass index is 23.26 kg/m. Wt Readings from Last 3 Encounters:  04/16/16 153 lb (69.4 kg)  04/10/16 158 lb 4 oz (71.8 kg)  04/02/16 163 lb (73.9 kg)    GENERAL:  well developed, well nourished, not in acute distress HEENT: normocephalic, pink conjunctivae, anicteric sclerae, no xanthelasma, normal dentition, oropharynx clear NECK:  no neck vein engorgement, JVP normal, no hepatojugular reflux, carotid upstroke brisk and symmetric, no bruit, no thyromegaly, no lymphadenopathy LUNGS:  good respiratory effort, clear to auscultation bilaterally CV:  PMI not displaced, no thrills, no lifts, S1 and S2 within normal limits, no palpable S3 or S4, no murmurs, no rubs, no gallops ABD:  Soft, nontender, nondistended, normoactive bowel sounds, no abdominal aortic bruit, no hepatomegaly, no splenomegaly MS: nontender back, no kyphosis, no scoliosis, no joint deformities EXT:  2+ DP/PT pulses, no edema, no varicosities, no cyanosis, no clubbing SKIN: warm, nondiaphoretic, normal turgor, no ulcers NEUROPSYCH: alert, oriented to person, place, and time, sensory/motor grossly intact, normal mood, appropriate affect  Recent Labs: 03/29/2016: ALT 60 04/13/2016: BUN 12; Creatinine, Ser 1.02; Hemoglobin 17.1; Platelets 278; Potassium 3.7; Sodium  139   Lipid Panel No results found for: CHOL, TRIG, HDL, CHOLHDL, VLDL, LDLCALC, LDLDIRECT   Other studies Reviewed:  EKG:  The ekg from 04/16/2016 was personally reviewed by me and it revealed sinus rhythm, 70 BPM. QT is within normal limits.  Additional studies/ records that were reviewed personally reviewed by me today include: None available   ASSESSMENT AND PLAN: Chest pain Atypical. Recommend further evaluation with echo and stress echocardiogram to rule out cardiac involvement.  Hypertension Tinnitus monitor blood pressure. PCP following this. We discussed that frequent use of NSAIDs mainly to sodium retention and subsequent elevation in blood pressure. It appears that his blood pressure only goes up with episodes of the neck pain and chest pain. Also, a resting blood pressure not related to episodes of pain are noted to go up, may need to reconsider use of Adderall.   Current medicines are reviewed at length with the patient today.  The patient does not have concerns regarding medicines.  Labs/ tests ordered  today include:  Orders Placed This Encounter  Procedures  . EKG 12-Lead  . ECHOCARDIOGRAM COMPLETE  . ECHOCARDIOGRAM STRESS TEST    I had a lengthy and detailed discussion with the patient regarding diagnoses, prognosis, diagnostic options.  I counseled the patient on importance of lifestyle modification including heart healthy diet, regular physical activity.   Disposition:   FU with undersigned after tests   Thank you for this consultation. We will forwarding this consultation to referring physician.   Signed, Wende Bushy, MD  04/16/2016 2:28 PM    [place]  This note was generated in part with voice recognition software and I apologize for any typographical errors that were not detected and corrected.

## 2016-04-18 ENCOUNTER — Encounter: Payer: Self-pay | Admitting: Family Medicine

## 2016-04-18 ENCOUNTER — Ambulatory Visit (INDEPENDENT_AMBULATORY_CARE_PROVIDER_SITE_OTHER): Payer: BLUE CROSS/BLUE SHIELD | Admitting: Family Medicine

## 2016-04-18 ENCOUNTER — Encounter: Payer: BLUE CROSS/BLUE SHIELD | Admitting: Physical Therapy

## 2016-04-18 VITALS — BP 130/90 | HR 88 | Temp 98.0°F | Ht 68.0 in | Wt 156.5 lb

## 2016-04-18 DIAGNOSIS — R0789 Other chest pain: Secondary | ICD-10-CM | POA: Diagnosis not present

## 2016-04-18 DIAGNOSIS — R1310 Dysphagia, unspecified: Secondary | ICD-10-CM | POA: Diagnosis not present

## 2016-04-18 DIAGNOSIS — M25511 Pain in right shoulder: Secondary | ICD-10-CM | POA: Diagnosis not present

## 2016-04-18 DIAGNOSIS — M792 Neuralgia and neuritis, unspecified: Secondary | ICD-10-CM

## 2016-04-18 DIAGNOSIS — M797 Fibromyalgia: Secondary | ICD-10-CM | POA: Diagnosis not present

## 2016-04-18 DIAGNOSIS — R1319 Other dysphagia: Secondary | ICD-10-CM

## 2016-04-18 NOTE — Patient Instructions (Signed)

## 2016-04-18 NOTE — Progress Notes (Signed)
Dr. Frederico Hamman T. Laurance Heide, MD, [facility] [place], [address] Phone: [phone] Fax: [phone]  04/18/2016  Patient: Raymond [name], MRN: [id number], DOB: January 09, 1976, 41 y.o.  Primary Physician:  [name], MD   Chief Complaint  Patient presents with  . Follow-up    results   Subjective:   Raymond [name] is a 41 y.o. very pleasant male patient who presents with the following:  Since 03/26/2016, 4 ER OV, 4 PCP OV, Cardiology OV.  He is had an extensive workup thus far.  Negative CT angiogram of his chest.  It appears that the patient has had 9 EKGs.   4 normal chest x-rays.  Still getting some shifting and shoulder and throught.   Trouble breathing.  Coldness in his shoulders and feet.  1 normal abdominal x-ray. Unremarkable cervical spine x-ray. Most recently, he has had MRIs of the cervical and thoracic spine on April 14, 2016, and these are entirely unremarkable.  He also saw cardiology yesterday, and an echocardiogram as well as a stress echocardiogram is planned.  He is here with his mother.  He denies anxiety and depression, and his mother also agrees that he is neither anxious nor depressed.  His chest wall pain is improving compared to the last time I've seen him.  Complaints of shoulder pain on the right side, greater than the left side.  Recently has seen shoulder surgeon, Dr. Tamera Punt, who had him go to physical therapy.  Apparently he may have discussed per the patient's report biceps transposition.  Previously, he had a subacromial decompression and distal clavicle excision.  He continues to have some neuropathic and burning tingling sensations in both extremities.  He complains of food getting stuck in his throat.  He reports that this is been ongoing for the last few weeks.  Past Medical History, Surgical History, Social History, Family History, Problem List, Medications, and Allergies  have been reviewed and updated if relevant.  Patient Active Problem List   Diagnosis Date Noted  . Chest wall pain 03/28/2016  . 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 Hypertension 04/06/2013  . Allergic rhinitis 01/18/2013  . Family history of long QT syndrome 06/12/2012  . 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 Smoker 06/12/2012  . Common migraine 05/26/2012  . Neck pain 05/26/2012  . Fibromyalgia 05/26/2012    Past Medical History:  Diagnosis Date  . Biceps tendonitis on right 01/2014  . History of gastric ulcer    summer 2015  . Hypertension    has not been taking his medication; advised to start back on med. today  . 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 Shoulder impingement 01/2014   right    Past Surgical History:  Procedure Laterality Date  . CHOLECYSTECTOMY    . RESECTION DISTAL CLAVICAL Right 02/21/2014   Procedure: RESECTION DISTAL CLAVICAL;  Surgeon: Nita Sells, MD;  Location: [facility];  Service: Orthopedics;  Laterality: Right;  . SHOULDER ARTHROSCOPY WITH SUBACROMIAL DECOMPRESSION Right 02/21/2014   Procedure: SHOULDER ARTHROSCOPY WITH SUBACROMIAL DECOMPRESSION, DISTAL CLAVICAL EXCISION, DEBRIDIMENT OF PARTIAL ROTATOR CUFF TEAR;  Surgeon: Nita Sells, MD;  Location: [facility];  Service: Orthopedics;  Laterality: Right;  Right shoulder arthroscopy subacromial decompression, distal clavical excision    Social History   Social History  . Marital status: Married    Spouse name: N/A  . Number of children: N/A  .  Years of education: N/A   Occupational History  . electrician Programme researcher, broadcasting/film/video   Social History Main Topics  . Smoking status: Former Smoker    Quit date: 09/24/2013  . Smokeless tobacco: Never Used  . Alcohol use No  . Drug use: No  . Sexual activity: Not on file   Other Topics Concern  . Not on file   Social History Narrative    Mom was in hospital with long QT syndrome, then heart in A Fib.   Sister has long QT syndrome   1st cousin: long QT syndrome          Family History  Problem Relation Age of Onset  . Diabetes Mother   . Heart disease Mother   . Long QT syndrome Mother   . Lung disease Father   . Diabetes Father     Allergies  Allergen Reactions  . Azithromycin Nausea And Vomiting  . Percocet [Oxycodone-Acetaminophen] Hives  . Vicodin [Hydrocodone-Acetaminophen] Hives  . Adhesive [Tape] Other (See Comments)    SKIN IRRITATION    Medication list reviewed and updated in full in [facility].   GEN: No acute illnesses, no fevers, chills. GI: No n/v/d, eating normally Pulm: No SOB Interactive and getting along well at home.  Otherwise, ROS is as per the HPI.  Objective:   BP 130/90   Pulse 88   Temp 98 F (36.7 C) (Oral)   Ht _0  (1.727 m)   Wt 156 lb 8 oz (71 kg)   BMI 23.80 kg/m   GEN: WDWN, NAD, Non-toxic, A & O x 3 HEENT: Atraumatic, Normocephalic. Neck supple. No masses, No LAD. Ears and Nose: No external deformity. CV: RRR, No M/G/R. No JVD. No thrill. No extra heart sounds. PULM: CTA B, no wheezes, crackles, rhonchi. No retractions. No resp. distress. No accessory muscle use. Chest wall is minimally tender to palpation and markedly improved compared to last examination. EXTR: No c/c/e NEURO Normal gait.  PSYCH: Normally interactive. Conversant. Not depressed or anxious appearing.  Calm demeanor.   Shoulder: R Inspection: No muscle wasting or winging Ecchymosis/edema: neg  AC joint, scapula, clavicle: NT Cervical spine: NT, full ROM Spurling's: neg Abduction: full, 5/5 Flexion: full, 5/5 IR, full, lift-off: 5/5 ER at neutral: full, 5/5 AC crossover and compression: neg Neer: neg Hawkins: neg Drop Test: neg Empty Can: neg Supraspinatus insertion: NT Bicipital groove: NT Speed's: neg Yergason's: neg Sulcus sign: neg Scapular dyskinesis: none obriens  neg Apprehension negative C5-T1 intact Sensation intact Grip 5/5   Laboratory and Imaging Data: Dg Eye Foreign Body  Result Date: 04/11/2016 CLINICAL DATA:  History of foreign body in the eyes on several occasions, pre MRI screening EXAM: ORBITS FOR FOREIGN BODY - 2 VIEW COMPARISON:  Orbital films of 12/22/2013 FINDINGS: Views of the orbits were obtained with the patient looking to the left and looking to the right. No orbital metallic foreign body is seen. The paranasal sinuses are clear. No bony abnormality is seen. IMPRESSION: No evidence of metallic foreign body within the orbits. Electronically Signed   By: Ivar Drape M.D.   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 Medical hx: HTN, past smoker (quit 3 years ago). EXAM: CHEST  2 VIEW COMPARISON:  04/05/2016 FINDINGS: The heart size and mediastinal  contours are within normal limits. Both lungs are clear. No pleural effusion or pneumothorax. The visualized skeletal structures are unremarkable. IMPRESSION: No active cardiopulmonary disease. Electronically Signed   By: Lajean Manes M.D.   On: 04/13/2016 17:56   Dg Chest 2 View  Result Date: 04/05/2016 CLINICAL DATA:  Acute onset of mid and right-sided chest pain. Shortness of breath Initial encounter. EXAM: CHEST  2 VIEW COMPARISON:  Chest radiograph performed 03/29/2016 FINDINGS: The lungs are well-aerated and clear. There is no evidence of focal opacification, pleural effusion or pneumothorax. The heart is normal in size; the mediastinal contour is within normal limits. No acute osseous abnormalities are seen. Clips are noted within the right upper quadrant, reflecting prior cholecystectomy. IMPRESSION: No acute cardiopulmonary process seen. Electronically Signed   By: Garald Balding M.D.   On: 04/05/2016 18:28    Dg Chest 2 View  Result Date: 03/29/2016 CLINICAL DATA:  Chest pain. EXAM: CHEST  2 VIEW COMPARISON:  03/25/2016 FINDINGS: The heart size and mediastinal contours are within normal limits. There is no evidence of pulmonary edema, consolidation, pneumothorax, nodule or pleural fluid. The visualized skeletal structures are unremarkable. IMPRESSION: No active cardiopulmonary disease. Electronically Signed   By: Aletta Edouard M.D.   On: 03/29/2016 13:13   Dg Chest 2 View  Result Date: 03/25/2016 CLINICAL DATA:  Chest pain for 2 hours.  Hypertension. EXAM: CHEST  2 VIEW COMPARISON:  None. FINDINGS: The heart size and mediastinal contours are within normal limits. Both lungs are clear. The visualized skeletal structures are unremarkable. IMPRESSION: No active cardiopulmonary disease. Electronically Signed   By: Ashley Royalty M.D.   On: 03/25/2016 23:40   Dg Cervical Spine Complete  Result Date: 04/02/2016 CLINICAL DATA:  acute worsening in neck pain, radiation of pain bilaterally, pt felt something slip on 03/21/2016 EXAM: CERVICAL SPINE - COMPLETE 4+ VIEW COMPARISON:  None. FINDINGS: No fracture.  No spondylolisthesis.  No bone lesion. The disc spaces and neural foramina are well preserved. No significant degenerative change. There is straightening of the normal cervical lordosis and a mild curvature on the AP view, convex to the right. This may be due to muscle spasm. Soft tissues are unremarkable. IMPRESSION: 1. No fracture, bone lesion or spondylolisthesis. 2. Straightening of the normal cervical lordosis and mild curvature, convex to the right, suggesting muscle spasm. Electronically Signed   By: Lajean Manes M.D.   On: 04/02/2016 15:03   Ct Chest W Contrast  Result Date: 04/06/2016 CLINICAL DATA:  Subacute onset of generalized chest pain. Initial encounter. EXAM: CT CHEST WITH CONTRAST TECHNIQUE: Multidetector CT imaging of the chest was performed during intravenous contrast administration. CONTRAST:   81m ISOVUE-300 IOPAMIDOL (ISOVUE-300) INJECTION 61% COMPARISON:  Chest radiograph performed 04/05/2016, and MRI of the thoracic spine performed 10/28/2011 FINDINGS: Cardiovascular: The heart is normal in size. The thoracic aorta is unremarkable. The great vessels are within normal limits. No calcific atherosclerotic disease is seen. Mediastinum/Nodes: The mediastinum is unremarkable in appearance. No mediastinal lymphadenopathy is seen. No pericardial effusion is identified. The visualized portions of the thyroid gland are unremarkable. No axillary lymphadenopathy is seen. Lungs/Pleura: The lungs are clear bilaterally. No focal consolidation, pleural effusion or pneumothorax is seen. No masses are identified. Upper Abdomen: The visualized portions of the liver and spleen are grossly unremarkable. The patient is status post cholecystectomy, with clips noted at the gallbladder fossa. The visualized portions of the pancreas, adrenal glands and kidneys are within normal limits. Musculoskeletal: No acute osseous abnormalities are  identified. The visualized musculature is unremarkable in appearance. IMPRESSION: Unremarkable contrast-enhanced CT of the chest. Electronically Signed   By: Garald Balding M.D.   On: 04/06/2016 01:45   Mr Cervical Spine Wo Contrast  Result Date: 04/14/2016 CLINICAL DATA:  Neck, shoulder and rib cage pain. Lightheadedness. Symptoms began 03/25/2016. No recent injury. EXAM: MRI THORACIC AND LUMBAR SPINE WITHOUT CONTRAST TECHNIQUE: Multiplanar and multiecho pulse sequences of the cervical spine, to include the craniocervical junction and cervicothoracic junction, and thoracic and lumbar spine, were obtained without intravenous contrast. COMPARISON:  MRI cervical spine 04/25/2011. FINDINGS: MRI CERVICAL SPINE FINDINGS Alignment: Maintained. Vertebrae: Height and signal are normal. Cord: Normal signal throughout. Posterior Fossa, vertebral arteries, paraspinal tissues: Negative. Disc levels:  C2-3:  Negative. C3-4: Tiny central protrusion without central canal or foraminal narrowing, unchanged. C4-5: Minimal disc bulge without central canal or foraminal stenosis. C5-6: Slight uncovertebral spurring on the left. The central canal and foramina are widely patent. C6-7: Minimal uncovertebral spurring on the left. The central canal and foramina are widely patent. C7-T1: Negative. MRI THORACIC SPINE FINDINGS Alignment:  Normal. Vertebrae: Height and signal are normal throughout. Cord:  Normal signal throughout. Paraspinal and other soft tissues: Normal. Disc levels: Intervertebral disc space height is maintained at all levels. The central canal and foramina are widely patent at all levels. IMPRESSION: No change in minimal spondylosis of the cervical spine without central canal or foraminal narrowing. Normal thoracic spine MRI. Electronically Signed   By: Inge Rise M.D.   On: 04/14/2016 15:17   Mr Thoracic Spine Wo Contrast  Result Date: 04/14/2016 CLINICAL DATA:  Neck, shoulder and rib cage pain. Lightheadedness. Symptoms began 03/25/2016. No recent injury. EXAM: MRI THORACIC AND LUMBAR SPINE WITHOUT CONTRAST TECHNIQUE: Multiplanar and multiecho pulse sequences of the cervical spine, to include the craniocervical junction and cervicothoracic junction, and thoracic and lumbar spine, were obtained without intravenous contrast. COMPARISON:  MRI cervical spine 04/25/2011. FINDINGS: MRI CERVICAL SPINE FINDINGS Alignment: Maintained. Vertebrae: Height and signal are normal. Cord: Normal signal throughout. Posterior Fossa, vertebral arteries, paraspinal tissues: Negative. Disc levels: C2-3:  Negative. C3-4: Tiny central protrusion without central canal or foraminal narrowing, unchanged. C4-5: Minimal disc bulge without central canal or foraminal stenosis. C5-6: Slight uncovertebral spurring on the left. The central canal and foramina are widely patent. C6-7: Minimal uncovertebral spurring on the left. The  central canal and foramina are widely patent. C7-T1: Negative. MRI THORACIC SPINE FINDINGS Alignment:  Normal. Vertebrae: Height and signal are normal throughout. Cord:  Normal signal throughout. Paraspinal and other soft tissues: Normal. Disc levels: Intervertebral disc space height is maintained at all levels. The central canal and foramina are widely patent at all levels. IMPRESSION: No change in minimal spondylosis of the cervical spine without central canal or foraminal narrowing. Normal thoracic spine MRI. Electronically Signed   By: Inge Rise M.D.   On: 04/14/2016 15:17   Dg Abd Portable 2 Views  Result Date: 03/29/2016 CLINICAL DATA:  Epigastric pain and diarrhea for 5 days. EXAM: PORTABLE ABDOMEN - 2 VIEW COMPARISON:  CT abdomen and pelvis September 29, 2013 FINDINGS: The bowel gas pattern is normal. There is no evidence of free air. Surgical clips in the included right abdomen compatible with cholecystectomy. No radio-opaque calculi or other significant radiographic abnormality is seen. Phleboliths project in the pelvis. IMPRESSION: Negative. Electronically Signed   By: Elon Alas M.D.   On: 03/29/2016 19:56   Recent Results (from the past 2160 hour(s))  Basic metabolic panel  Status: Abnormal   Collection Time: 03/25/16 10:52 PM  Result Value Ref Range   Sodium 136 135 - 145 mmol/L   Potassium 3.2 (L) 3.5 - 5.1 mmol/L   Chloride 101 101 - 111 mmol/L   CO2 21 (L) 22 - 32 mmol/L   Glucose, Bld 104 (H) 65 - 99 mg/dL   BUN 15 6 - 20 mg/dL   Creatinine, Ser 1.09 0.61 - 1.24 mg/dL   Calcium 9.7 8.9 - 10.3 mg/dL   GFR calc non Af Amer >60 >60 mL/min   GFR calc Af Amer >60 >60 mL/min    Comment: (NOTE) The eGFR has been calculated using the CKD EPI equation. This calculation has not been validated in all clinical situations. eGFR's persistently <60 mL/min signify possible Chronic Kidney Disease.    Anion gap 14 5 - 15  CBC     Status: None   Collection Time: 03/25/16 10:52  PM  Result Value Ref Range   WBC 9.5 4.0 - 10.5 K/uL   RBC 5.22 4.22 - 5.81 MIL/uL   Hemoglobin 16.1 13.0 - 17.0 g/dL   HCT 45.6 39.0 - 52.0 %   MCV 87.4 78.0 - 100.0 fL   MCH 30.8 26.0 - 34.0 pg   MCHC 35.3 30.0 - 36.0 g/dL   RDW 12.2 11.5 - 15.5 %   Platelets 280 150 - 400 K/uL  Troponin I     Status: None   Collection Time: 03/25/16 10:52 PM  Result Value Ref Range   Troponin I <0.03 <0.03 ng/mL  I-stat troponin, ED     Status: None   Collection Time: 03/26/16  5:28 AM  Result Value Ref Range   Troponin i, poc 0.00 0.00 - 0.08 ng/mL   Comment 3            Comment: Due to the release kinetics of cTnI, a negative result within the first hours of the onset of symptoms does not rule out myocardial infarction with certainty. If myocardial infarction is still suspected, repeat the test at appropriate intervals.   Basic metabolic panel     Status: Abnormal   Collection Time: 03/29/16 12:51 PM  Result Value Ref Range   Sodium 138 135 - 145 mmol/L   Potassium 3.5 3.5 - 5.1 mmol/L   Chloride 104 101 - 111 mmol/L   CO2 24 22 - 32 mmol/L   Glucose, Bld 94 65 - 99 mg/dL   BUN 21 (H) 6 - 20 mg/dL   Creatinine, Ser 1.01 0.61 - 1.24 mg/dL   Calcium 9.7 8.9 - 10.3 mg/dL   GFR calc non Af Amer >60 >60 mL/min   GFR calc Af Amer >60 >60 mL/min    Comment: (NOTE) The eGFR has been calculated using the CKD EPI equation. This calculation has not been validated in all clinical situations. eGFR's persistently <60 mL/min signify possible Chronic Kidney Disease.    Anion gap 10 5 - 15  CBC     Status: None   Collection Time: 03/29/16 12:51 PM  Result Value Ref Range   WBC 10.2 3.8 - 10.6 K/uL   RBC 5.28 4.40 - 5.90 MIL/uL   Hemoglobin 16.2 13.0 - 18.0 g/dL   HCT 45.9 40.0 - 52.0 %   MCV 86.8 80.0 - 100.0 fL   MCH 30.7 26.0 - 34.0 pg   MCHC 35.3 32.0 - 36.0 g/dL   RDW 12.6 11.5 - 14.5 %   Platelets 248 150 - 440 K/uL  Troponin  I     Status: None   Collection Time: 03/29/16 12:51 PM   Result Value Ref Range   Troponin I <0.03 <0.03 ng/mL  Lipase, blood     Status: None   Collection Time: 03/29/16 12:51 PM  Result Value Ref Range   Lipase 29 11 - 51 U/L  Hepatic function panel     Status: Abnormal   Collection Time: 03/29/16 12:51 PM  Result Value Ref Range   Total Protein 7.9 6.5 - 8.1 g/dL   Albumin 5.0 3.5 - 5.0 g/dL   AST 36 15 - 41 U/L   ALT 60 17 - 63 U/L   Alkaline Phosphatase 55 38 - 126 U/L   Total Bilirubin 0.7 0.3 - 1.2 mg/dL   Bilirubin, Direct <0.1 (L) 0.1 - 0.5 mg/dL   Indirect Bilirubin NOT CALCULATED 0.3 - 0.9 mg/dL  Basic metabolic panel     Status: Abnormal   Collection Time: 04/05/16  6:03 PM  Result Value Ref Range   Sodium 137 135 - 145 mmol/L   Potassium 3.4 (L) 3.5 - 5.1 mmol/L   Chloride 102 101 - 111 mmol/L   CO2 20 (L) 22 - 32 mmol/L   Glucose, Bld 151 (H) 65 - 99 mg/dL   BUN 22 (H) 6 - 20 mg/dL   Creatinine, Ser 1.21 0.61 - 1.24 mg/dL   Calcium 10.4 (H) 8.9 - 10.3 mg/dL   GFR calc non Af Amer >60 >60 mL/min   GFR calc Af Amer >60 >60 mL/min    Comment: (NOTE) The eGFR has been calculated using the CKD EPI equation. This calculation has not been validated in all clinical situations. eGFR's persistently <60 mL/min signify possible Chronic Kidney Disease.    Anion gap 15 5 - 15  CBC     Status: Abnormal   Collection Time: 04/05/16  6:03 PM  Result Value Ref Range   WBC 18.9 (H) 4.0 - 10.5 K/uL   RBC 5.51 4.22 - 5.81 MIL/uL   Hemoglobin 16.9 13.0 - 17.0 g/dL   HCT 46.7 39.0 - 52.0 %   MCV 84.8 78.0 - 100.0 fL   MCH 30.7 26.0 - 34.0 pg   MCHC 36.2 (H) 30.0 - 36.0 g/dL   RDW 11.9 11.5 - 15.5 %   Platelets 306 150 - 400 K/uL  I-stat troponin, ED     Status: None   Collection Time: 04/05/16  6:34 PM  Result Value Ref Range   Troponin i, poc 0.00 0.00 - 0.08 ng/mL   Comment 3            Comment: Due to the release kinetics of cTnI, a negative result within the first hours of the onset of symptoms does not rule  out myocardial infarction with certainty. If myocardial infarction is still suspected, repeat the test at appropriate intervals.   I-stat troponin, ED     Status: None   Collection Time: 04/13/16  3:59 PM  Result Value Ref Range   Troponin i, poc 0.00 0.00 - 0.08 ng/mL   Comment 3            Comment: Due to the release kinetics of cTnI, a negative result within the first hours of the onset of symptoms does not rule out myocardial infarction with certainty. If myocardial infarction is still suspected, repeat the test at appropriate intervals.   Basic metabolic panel     Status: None   Collection Time: 04/13/16  4:28 PM  Result Value  Ref Range   Sodium 139 135 - 145 mmol/L   Potassium 3.7 3.5 - 5.1 mmol/L   Chloride 107 101 - 111 mmol/L   CO2 22 22 - 32 mmol/L   Glucose, Bld 93 65 - 99 mg/dL   BUN 12 6 - 20 mg/dL   Creatinine, Ser 1.02 0.61 - 1.24 mg/dL   Calcium 9.7 8.9 - 10.3 mg/dL   GFR calc non Af Amer >60 >60 mL/min   GFR calc Af Amer >60 >60 mL/min    Comment: (NOTE) The eGFR has been calculated using the CKD EPI equation. This calculation has not been validated in all clinical situations. eGFR's persistently <60 mL/min signify possible Chronic Kidney Disease.    Anion gap 10 5 - 15  CBC     Status: Abnormal   Collection Time: 04/13/16  4:28 PM  Result Value Ref Range   WBC 13.0 (H) 4.0 - 10.5 K/uL   RBC 5.56 4.22 - 5.81 MIL/uL   Hemoglobin 17.1 (H) 13.0 - 17.0 g/dL    Comment: REPEATED TO VERIFY   HCT 46.7 39.0 - 52.0 %   MCV 84.0 78.0 - 100.0 fL   MCH 30.8 26.0 - 34.0 pg    Comment: REPEATED TO VERIFY   MCHC 36.6 (H) 30.0 - 36.0 g/dL    Comment: REPEATED TO VERIFY   RDW 12.5 11.5 - 15.5 %   Platelets 278 150 - 400 K/uL     Assessment and Plan:   Chest wall pain  Esophageal dysphagia - Plan: Ambulatory referral to Gastroenterology  Right shoulder pain, unspecified chronicity  Nerve pain  Fibromyalgia   Chest wall pain is improved.  Recommended  return to work.  Possible dysphasia.  GI workup and possible EGD would be reasonable.  Right shoulder pain.  Regular reassuring exam.  Minimal impingement.  Stable on examination.  No apprehension.  No signs of labral defect.  I think this is the least of his worries.  Unclear why the patient is having nerve and neuropathic sensations with a perfectly clear cervical spine and thoracic spine MRI.  Previously, diagnosed with fibromyalgia, and he has taken essentially all neuropathic pain medications previously without significant improvement in his symptoms years ago.  Challenging case.  I'm not entirely clear how to best take care of this patient's multiple ongoing problems, some of which have no clear origin.  Certainly, he appears improved compared to last week.  Recommended resumption of normal routine.  Follow-up: if needed only  Orders Placed This Encounter  Procedures  . Ambulatory referral to Gastroenterology    Signed,  Frederico Hamman T. Zerline Melchior, MD   Allergies as of 04/18/2016      Reactions   Azithromycin Nausea And Vomiting   Percocet [oxycodone-acetaminophen] Hives   Vicodin [hydrocodone-acetaminophen] Hives   Adhesive [tape] Other (See Comments)   SKIN IRRITATION      Medication List       Accurate as of 04/18/16 11:59 PM. Always use your most recent med list.          amphetamine-dextroamphetamine 15 MG tablet Commonly known as:  ADDERALL Take 1 tablet by mouth 2 (two) times daily with a meal.   cetirizine-pseudoephedrine 5-120 MG tablet Commonly known as:  ZYRTEC-D Take 1 tablet by mouth once a week.   Eszopiclone 3 MG Tabs TAKE 1 TABLET BY MOUTH 30 MINUTES PRIOR TO BEDTIME   fluticasone 50 MCG/ACT nasal spray Commonly known as:  FLONASE USE 2 SPRAYS IN EACH NOSTRIL EVERY DAY  hydrochlorothiazide 12.5 MG capsule Commonly known as:  MICROZIDE TAKE ONE CAPSULE BY MOUTH EVERY DAY   lidocaine 5 % Commonly known as:  LIDODERM Place 1 patch onto the skin  daily. Remove & Discard patch within 12 hours or as directed by MD   naproxen 500 MG tablet Commonly known as:  NAPROSYN Take 1 tablet (500 mg total) by mouth 2 (two) times daily.   omeprazole 20 MG capsule Commonly known as:  PRILOSEC Take 20 mg by mouth daily.   VITAMIN D PO Take 5,000 mg by mouth daily.

## 2016-04-18 NOTE — Progress Notes (Signed)
Pre visit review using our clinic review tool, if applicable. No additional management support is needed unless otherwise documented below in the visit note. 

## 2016-04-19 ENCOUNTER — Telehealth: Payer: Self-pay

## 2016-04-19 NOTE — Telephone Encounter (Signed)
Pt was seen on 04/18/16 and discussed with Dr Lorelei Pont about starting med for anxiety or depression and pt wanted to discuss with wife first. Mrs Byun said pt wants to try med sent to [facility].

## 2016-04-22 ENCOUNTER — Other Ambulatory Visit: Payer: Self-pay | Admitting: Family Medicine

## 2016-04-22 DIAGNOSIS — M546 Pain in thoracic spine: Secondary | ICD-10-CM | POA: Diagnosis not present

## 2016-04-22 MED ORDER — ESCITALOPRAM OXALATE 10 MG PO TABS: 10.0000 mg | ORAL_TABLET | Freq: Every day | ORAL | 3 refills | Status: DC

## 2016-04-22 NOTE — Telephone Encounter (Signed)
At the time of his office visit, he and his mother denied any anxiety or depression several times.   I will have to call him myself tonight when I am able.

## 2016-04-22 NOTE — Telephone Encounter (Signed)
D/w wife - she thinks he is depressed since his dad died.   Discussed and sending in lexapro.    Morey Hummingbird, can you help schedule a f/u OV with me in 4-6 weeks.

## 2016-04-23 NOTE — Telephone Encounter (Signed)
I spoke to patient's wife and scheduled appointment on 05/30/16 at 5:00.

## 2016-04-24 ENCOUNTER — Ambulatory Visit (INDEPENDENT_AMBULATORY_CARE_PROVIDER_SITE_OTHER): Payer: BLUE CROSS/BLUE SHIELD | Admitting: Gastroenterology

## 2016-04-24 ENCOUNTER — Encounter: Payer: Self-pay | Admitting: Gastroenterology

## 2016-04-24 VITALS — BP 129/94 | HR 60 | Temp 97.9°F | Resp 15 | Ht 68.0 in | Wt 153.0 lb

## 2016-04-24 DIAGNOSIS — R1319 Other dysphagia: Secondary | ICD-10-CM

## 2016-04-24 DIAGNOSIS — R1310 Dysphagia, unspecified: Secondary | ICD-10-CM | POA: Diagnosis not present

## 2016-04-24 NOTE — Progress Notes (Signed)
Gastroenterology Consultation  Referring Provider:     [name], MD Primary Care Physician:  Raymond Loffler, MD Primary Gastroenterologist:  Dr. Allen Norris     Reason for Consultation:     Dysphagia        HPI:   Raymond [name] is a 40 y.o. y/o male referred for consultation & management of Dysphagia by Dr. [name], MD.  This patient comes today with a report of dysphagia. The patient reports that dysphagia has been present for the last 3-4 months. The patient also reports that he has lost approximately 6-10 pounds since the dysphagia started. The patient reports that it is mostly to solids but can also happen with liquids. There is no report of any black stools or bloody stools. The patient also denies any alcohol abuse or change in bowel habits. The patient denies food getting stuck bad enough to have him have vomiting to bring up the food. She also states that he has acid breakthrough also times a week and has chronic regurgitation. The patient is presently on omeprazole.  Past Medical History:  Diagnosis Date  . Biceps tendonitis on right 01/2014  . History of gastric ulcer    summer 2015  . Hypertension    has not been taking his medication; advised to start back on med. today  . 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 Shoulder impingement 01/2014   right    Past Surgical History:  Procedure Laterality Date  . CHOLECYSTECTOMY    . RESECTION DISTAL CLAVICAL Right 02/21/2014   Procedure: RESECTION DISTAL CLAVICAL;  Surgeon: Nita Sells, MD;  Location: [place];  Service: Orthopedics;  Laterality: Right;  . SHOULDER ARTHROSCOPY WITH SUBACROMIAL DECOMPRESSION Right 02/21/2014   Procedure: SHOULDER ARTHROSCOPY WITH SUBACROMIAL DECOMPRESSION, DISTAL CLAVICAL EXCISION, DEBRIDIMENT OF PARTIAL ROTATOR CUFF TEAR;  Surgeon: Nita Sells, MD;  Location: [place];  Service: Orthopedics;  Laterality: Right;  Right  shoulder arthroscopy subacromial decompression, distal clavical excision    Prior to Admission medications   Medication Sig Start Date End Date Taking? Authorizing Provider  amphetamine-dextroamphetamine (ADDERALL) 15 MG tablet Take 1 tablet by mouth 2 (two) times daily with a meal. 12/13/15  Yes [name], MD  cetirizine-pseudoephedrine (ZYRTEC-D) 5-120 MG per tablet Take 1 tablet by mouth once a week.    Yes Historical Provider, MD  Cholecalciferol (VITAMIN D PO) Take 5,000 mg by mouth daily.   Yes Historical Provider, MD  escitalopram (LEXAPRO) 10 MG tablet Take 1 tablet (10 mg total) by mouth daily. 04/22/16  Yes [name], MD  Eszopiclone 3 MG TABS TAKE 1 TABLET BY MOUTH 30 MINUTES PRIOR TO BEDTIME Patient taking differently: TAKE 1 TABLET BY MOUTH 30 MINUTES PRIOR TO BEDTIME AS NEEDED FOR SLEEP 06/07/15  Yes [name], MD  fluticasone (FLONASE) 50 MCG/ACT nasal spray USE 2 SPRAYS IN EACH NOSTRIL EVERY DAY Patient taking differently: USE 2 SPRAYS IN EACH NOSTRIL EVERY DAY AS NEEDED FOR ALLERGIES 03/17/15  Yes [name], MD  hydrochlorothiazide (MICROZIDE) 12.5 MG capsule TAKE ONE CAPSULE BY MOUTH EVERY DAY 11/24/14  Yes [name], MD  lidocaine (LIDODERM) 5 % Place 1 patch onto the skin daily. Remove & Discard patch within 12 hours or as directed by MD 04/05/16  Yes Amy Cletis Athens, MD  omeprazole (PRILOSEC) 20 MG capsule Take 20 mg by mouth daily.   Yes Historical Provider, MD  naproxen (NAPROSYN) 500 MG tablet Take 1 tablet (500  mg total) by mouth 2 (two) times daily. Patient not taking: Reported on 04/24/2016 03/26/16   [name], MD    Family History  Problem Relation Age of Onset  . Diabetes Mother   . Heart disease Mother   . Long QT syndrome Mother   . Lung disease Father   . Diabetes Father      Social History  Substance Use Topics  . Smoking status: Former Smoker    Quit date: 09/24/2013  . Smokeless tobacco: Never Used  . Alcohol use No    Allergies  as of 04/24/2016 - Review Complete 04/24/2016  Allergen Reaction Noted  . Azithromycin Nausea And Vomiting 05/25/2012  . Percocet [oxycodone-acetaminophen] Hives 05/25/2012  . Vicodin [hydrocodone-acetaminophen] Hives 05/25/2012  . Adhesive [tape] Other (See Comments) 02/17/2014    Review of Systems:    All systems reviewed and negative except where noted in HPI.   Physical Exam:  BP (!) 129/94 (BP Location: Right Arm, Patient Position: Sitting, Cuff Size: Large)   Pulse 60   Temp 97.9 F (36.6 C) (Oral)   Resp 15   Ht 5\' 8"  (1.727 m)   Wt 153 lb (69.4 kg)   SpO2 100%   BMI 23.26 kg/m  No LMP for male patient. Psych:  Alert and cooperative. Normal mood and affect. General:   Alert,  Well-developed, well-nourished, pleasant and cooperative in NAD Head:  Normocephalic and atraumatic. Eyes:  Sclera clear, no icterus.   Conjunctiva pink. Ears:  Normal auditory acuity. Nose:  No deformity, discharge, or lesions. Mouth:  No deformity or lesions,oropharynx pink & moist. Neck:  Supple; no masses or thyromegaly. Lungs:  Respirations even and unlabored.  Clear throughout to auscultation.   No wheezes, crackles, or rhonchi. No acute distress. Heart:  Regular rate and rhythm; no murmurs, clicks, rubs, or gallops. Abdomen:  Normal bowel sounds.  No bruits.  Soft, non-tender and non-distended without masses, hepatosplenomegaly or hernias noted.  No guarding or rebound tenderness.  Negative Carnett sign.   Rectal:  Deferred.  Msk:  Symmetrical without gross deformities.  Good, equal movement & strength bilaterally. Pulses:  Normal pulses noted. Extremities:  No clubbing or edema.  No cyanosis. Neurologic:  Alert and oriented x3;  grossly normal neurologically. Skin:  Intact without significant lesions or rashes.  No jaundice. Lymph Nodes:  No significant cervical adenopathy. Psych:  Alert and cooperative. Normal mood and affect.  Imaging Studies: Dg Eye Foreign Body  Result Date:  04/11/2016 CLINICAL DATA:  History of foreign body in the eyes on several occasions, pre MRI screening EXAM: ORBITS FOR FOREIGN BODY - 2 VIEW COMPARISON:  Orbital films of 12/22/2013 FINDINGS: Views of the orbits were obtained with the patient looking to the left and looking to the right. No orbital metallic foreign body is seen. The paranasal sinuses are clear. No bony abnormality is seen. IMPRESSION: No evidence of metallic foreign body within the orbits. Electronically Signed   By: Ivar Drape M.D.   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 Medical hx: HTN, past smoker (quit 3 years ago). EXAM: CHEST  2 VIEW COMPARISON:  04/05/2016 FINDINGS: The heart size and mediastinal contours are within normal limits. Both lungs are clear. No pleural effusion or pneumothorax. The visualized skeletal structures are unremarkable. IMPRESSION: No  active cardiopulmonary disease. Electronically Signed   By: Lajean Manes M.D.   On: 04/13/2016 17:56   Dg Chest 2 View  Result Date: 04/05/2016 CLINICAL DATA:  Acute onset of mid and right-sided chest pain. Shortness of breath Initial encounter. EXAM: CHEST  2 VIEW COMPARISON:  Chest radiograph performed 03/29/2016 FINDINGS: The lungs are well-aerated and clear. There is no evidence of focal opacification, pleural effusion or pneumothorax. The heart is normal in size; the mediastinal contour is within normal limits. No acute osseous abnormalities are seen. Clips are noted within the right upper quadrant, reflecting prior cholecystectomy. IMPRESSION: No acute cardiopulmonary process seen. Electronically Signed   By: Garald Balding M.D.   On: 04/05/2016 18:28   Dg Chest 2 View  Result Date: 03/29/2016 CLINICAL DATA:  Chest pain. EXAM: CHEST  2 VIEW COMPARISON:  03/25/2016 FINDINGS: The heart  size and mediastinal contours are within normal limits. There is no evidence of pulmonary edema, consolidation, pneumothorax, nodule or pleural fluid. The visualized skeletal structures are unremarkable. IMPRESSION: No active cardiopulmonary disease. Electronically Signed   By: Aletta Edouard M.D.   On: 03/29/2016 13:13   Dg Chest 2 View  Result Date: 03/25/2016 CLINICAL DATA:  Chest pain for 2 hours.  Hypertension. EXAM: CHEST  2 VIEW COMPARISON:  None. FINDINGS: The heart size and mediastinal contours are within normal limits. Both lungs are clear. The visualized skeletal structures are unremarkable. IMPRESSION: No active cardiopulmonary disease. Electronically Signed   By: Ashley Royalty M.D.   On: 03/25/2016 23:40   Dg Cervical Spine Complete  Result Date: 04/02/2016 CLINICAL DATA:  acute worsening in neck pain, radiation of pain bilaterally, pt felt something slip on 03/21/2016 EXAM: CERVICAL SPINE - COMPLETE 4+ VIEW COMPARISON:  None. FINDINGS: No fracture.  No spondylolisthesis.  No bone lesion. The disc spaces and neural foramina are well preserved. No significant degenerative change. There is straightening of the normal cervical lordosis and a mild curvature on the AP view, convex to the right. This may be due to muscle spasm. Soft tissues are unremarkable. IMPRESSION: 1. No fracture, bone lesion or spondylolisthesis. 2. Straightening of the normal cervical lordosis and mild curvature, convex to the right, suggesting muscle spasm. Electronically Signed   By: Lajean Manes M.D.   On: 04/02/2016 15:03   Ct Chest W Contrast  Result Date: 04/06/2016 CLINICAL DATA:  Subacute onset of generalized chest pain. Initial encounter. EXAM: CT CHEST WITH CONTRAST TECHNIQUE: Multidetector CT imaging of the chest was performed during intravenous contrast administration. CONTRAST:  27mL ISOVUE-300 IOPAMIDOL (ISOVUE-300) INJECTION 61% COMPARISON:  Chest radiograph performed 04/05/2016, and MRI of the thoracic spine  performed 10/28/2011 FINDINGS: Cardiovascular: The heart is normal in size. The thoracic aorta is unremarkable. The great vessels are within normal limits. No calcific atherosclerotic disease is seen. Mediastinum/Nodes: The mediastinum is unremarkable in appearance. No mediastinal lymphadenopathy is seen. No pericardial effusion is identified. The visualized portions of the thyroid gland are unremarkable. No axillary lymphadenopathy is seen. Lungs/Pleura: The lungs are clear bilaterally. No focal consolidation, pleural effusion or pneumothorax is seen. No masses are identified. Upper Abdomen: The visualized portions of the liver and spleen are grossly unremarkable. The patient is status post cholecystectomy, with clips noted at the gallbladder fossa. The visualized portions of the pancreas, adrenal glands and kidneys are within normal limits. Musculoskeletal: No acute osseous abnormalities are identified. The visualized musculature is unremarkable in appearance. IMPRESSION: Unremarkable contrast-enhanced CT of the chest. Electronically Signed   By: Jacqulynn Cadet  Chang M.D.   On: 04/06/2016 01:45   Mr Cervical Spine Wo Contrast  Result Date: 04/14/2016 CLINICAL DATA:  Neck, shoulder and rib cage pain. Lightheadedness. Symptoms began 03/25/2016. No recent injury. EXAM: MRI THORACIC AND LUMBAR SPINE WITHOUT CONTRAST TECHNIQUE: Multiplanar and multiecho pulse sequences of the cervical spine, to include the craniocervical junction and cervicothoracic junction, and thoracic and lumbar spine, were obtained without intravenous contrast. COMPARISON:  MRI cervical spine 04/25/2011. FINDINGS: MRI CERVICAL SPINE FINDINGS Alignment: Maintained. Vertebrae: Height and signal are normal. Cord: Normal signal throughout. Posterior Fossa, vertebral arteries, paraspinal tissues: Negative. Disc levels: C2-3:  Negative. C3-4: Tiny central protrusion without central canal or foraminal narrowing, unchanged. C4-5: Minimal disc bulge without  central canal or foraminal stenosis. C5-6: Slight uncovertebral spurring on the left. The central canal and foramina are widely patent. C6-7: Minimal uncovertebral spurring on the left. The central canal and foramina are widely patent. C7-T1: Negative. MRI THORACIC SPINE FINDINGS Alignment:  Normal. Vertebrae: Height and signal are normal throughout. Cord:  Normal signal throughout. Paraspinal and other soft tissues: Normal. Disc levels: Intervertebral disc space height is maintained at all levels. The central canal and foramina are widely patent at all levels. IMPRESSION: No change in minimal spondylosis of the cervical spine without central canal or foraminal narrowing. Normal thoracic spine MRI. Electronically Signed   By: Inge Rise M.D.   On: 04/14/2016 15:17   Mr Thoracic Spine Wo Contrast  Result Date: 04/14/2016 CLINICAL DATA:  Neck, shoulder and rib cage pain. Lightheadedness. Symptoms began 03/25/2016. No recent injury. EXAM: MRI THORACIC AND LUMBAR SPINE WITHOUT CONTRAST TECHNIQUE: Multiplanar and multiecho pulse sequences of the cervical spine, to include the craniocervical junction and cervicothoracic junction, and thoracic and lumbar spine, were obtained without intravenous contrast. COMPARISON:  MRI cervical spine 04/25/2011. FINDINGS: MRI CERVICAL SPINE FINDINGS Alignment: Maintained. Vertebrae: Height and signal are normal. Cord: Normal signal throughout. Posterior Fossa, vertebral arteries, paraspinal tissues: Negative. Disc levels: C2-3:  Negative. C3-4: Tiny central protrusion without central canal or foraminal narrowing, unchanged. C4-5: Minimal disc bulge without central canal or foraminal stenosis. C5-6: Slight uncovertebral spurring on the left. The central canal and foramina are widely patent. C6-7: Minimal uncovertebral spurring on the left. The central canal and foramina are widely patent. C7-T1: Negative. MRI THORACIC SPINE FINDINGS Alignment:  Normal. Vertebrae: Height and  signal are normal throughout. Cord:  Normal signal throughout. Paraspinal and other soft tissues: Normal. Disc levels: Intervertebral disc space height is maintained at all levels. The central canal and foramina are widely patent at all levels. IMPRESSION: No change in minimal spondylosis of the cervical spine without central canal or foraminal narrowing. Normal thoracic spine MRI. Electronically Signed   By: Inge Rise M.D.   On: 04/14/2016 15:17   Dg Abd Portable 2 Views  Result Date: 03/29/2016 CLINICAL DATA:  Epigastric pain and diarrhea for 5 days. EXAM: PORTABLE ABDOMEN - 2 VIEW COMPARISON:  CT abdomen and pelvis September 29, 2013 FINDINGS: The bowel gas pattern is normal. There is no evidence of free air. Surgical clips in the included right abdomen compatible with cholecystectomy. No radio-opaque calculi or other significant radiographic abnormality is seen. Phleboliths project in the pelvis. IMPRESSION: Negative. Electronically Signed   By: Elon Alas M.D.   On: 03/29/2016 19:56    Assessment and Plan:   LYNX GOODRICH is a 41 y.o. y/o male who comes in today with a history of weight loss with dysphagia. The patient will be given samples of  Dexilant to take instead of his omeprazole to see if this helps his heartburn any better. The patient also will be set up for an upper endoscopy to rule out a stricture versus neoplasm as the cause of his dysphagia. Been told that if he has more symptoms during the night he should take his medication in the evening.I have discussed risks & benefits which include, but are not limited to, bleeding, infection, perforation & drug reaction.  The patient agrees with this plan & written consent will be obtained.       Lucilla Lame, MD. [facility] Regal   Note: This dictation was prepared with Dragon dictation along with smaller phrase technology. Any transcriptional errors that result from this process are unintentional.

## 2016-04-29 ENCOUNTER — Ambulatory Visit: Payer: Self-pay | Admitting: Gastroenterology

## 2016-04-30 ENCOUNTER — Ambulatory Visit: Payer: BLUE CROSS/BLUE SHIELD | Attending: Orthopedic Surgery

## 2016-04-30 DIAGNOSIS — M5441 Lumbago with sciatica, right side: Secondary | ICD-10-CM | POA: Insufficient documentation

## 2016-04-30 DIAGNOSIS — M25512 Pain in left shoulder: Secondary | ICD-10-CM | POA: Diagnosis not present

## 2016-04-30 DIAGNOSIS — M546 Pain in thoracic spine: Secondary | ICD-10-CM | POA: Insufficient documentation

## 2016-04-30 DIAGNOSIS — G8929 Other chronic pain: Secondary | ICD-10-CM | POA: Insufficient documentation

## 2016-04-30 DIAGNOSIS — M25511 Pain in right shoulder: Secondary | ICD-10-CM | POA: Insufficient documentation

## 2016-05-01 ENCOUNTER — Other Ambulatory Visit: Payer: Self-pay

## 2016-05-01 ENCOUNTER — Telehealth: Payer: Self-pay | Admitting: Gastroenterology

## 2016-05-01 DIAGNOSIS — R1310 Dysphagia, unspecified: Secondary | ICD-10-CM

## 2016-05-01 DIAGNOSIS — R1319 Other dysphagia: Secondary | ICD-10-CM

## 2016-05-01 NOTE — Therapy (Addendum)
[facility] [address] [place], [address] Phone: [phone]   Fax:  [phone]  Physical Therapy Evaluation  Patient Details  Name: Raymond [name] MRN: [id number] Date of Birth: [birth year] Referring Provider: Karenann Cai PA-C  Encounter Date: 04/30/2016      PT End of Session - 04/30/16 1536    Visit Number 1   Number of Visits 12   Date for PT Re-Evaluation 06/11/16   PT Start Time 2482   PT Stop Time 1615   PT Time Calculation (min) 60 min   Activity Tolerance Patient tolerated treatment well   Behavior During Therapy [facility] for tasks assessed/performed      Past Medical History:  Diagnosis Date  . Biceps tendonitis on right 01/2014  . History of gastric ulcer    summer 2015  . Hypertension    has not been taking his medication; advised to start back on med. today  . 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 Shoulder impingement 01/2014   right    Past Surgical History:  Procedure Laterality Date  . CHOLECYSTECTOMY    . RESECTION DISTAL CLAVICAL Right 02/21/2014   Procedure: RESECTION DISTAL CLAVICAL;  Surgeon: Nita Sells, MD;  Location: [facility];  Service: Orthopedics;  Laterality: Right;  . SHOULDER ARTHROSCOPY WITH SUBACROMIAL DECOMPRESSION Right 02/21/2014   Procedure: SHOULDER ARTHROSCOPY WITH SUBACROMIAL DECOMPRESSION, DISTAL CLAVICAL EXCISION, DEBRIDIMENT OF PARTIAL ROTATOR CUFF TEAR;  Surgeon: Nita Sells, MD;  Location: [facility];  Service: Orthopedics;  Laterality: Right;  Right shoulder arthroscopy subacromial decompression, distal clavical excision    There were no vitals filed for this visit.       Subjective Assessment - 04/30/16 1519    Subjective Low back pain   Pertinent History Pt reports that he was lifting a bunch of things around the house on February 5th and he noticed that he started having low back pain  the following day. He does not recall a particular mechanism of injury. He describes the pain as "nerve" pain as well as "achy, sharp, and dull." "It feels like I'm getting shocked." Pt reports pain occurs sometimes unilaterally in low back, sometimes alternates between sides, and sometimes concurrently bilaterally. Pain radiates around the front of his abdomen (not through), to his shoulders, and down his arms. Pain occasionally radiates down both legs but pain primarily radiates down the RLE. He reports no change in pain as the day progresses. Pain occasionally wakes him up at night. Previously he was sleeping all night. Worst pain: 10/10, Best: 4/10, Present: 6/10. Pt initially denies history of any similar pain however at other times reports that he had comparable low back pain when he was first diagnosed with fibromyalgia in 2014. Pt reports loss of intermittent loss bladder control since his back pain started but not loss of bowel control. He reports that he disclosed this information to his physician. He also complains of intermittent numbness in the R groin area, which he feels run down his right leg. Denies chills, fevers, or night sweats. Confirms weight loss of 22# since symptoms first began in [place]. Pt reports a prior history of bilateral shoulder and neck pain which feels better when his "vertebra in upper back slips back in." He reports that chiropractic manipulation improves his shoulder pain, chest pain, and difficulty with swallowing. History of R SAD January 2016. Per patient report he had MRI of both cervical and  thoracic spine without any findings. Since 03/11/16 he has had 4 ER visits, 4 PCP visits,  1 cardiology visit, 1 visit to spine specialist, and 1 GI visit. He saw GI due to dysphagia and per note plan is for upper endoscopy.    Limitations Walking   Diagnostic tests MRI of neck and thoracic spine: WNL   Patient Stated Goals Be able to work pain free and return to martial arts    Currently in Pain? Yes   Pain Score 6   Worst: 10/10, Best: 4/10   Pain Location Back   Pain Orientation Right;Left;Lower   Pain Descriptors / Indicators Aching;Dull;Sharp   Pain Type Acute pain   Pain Radiating Towards Radiating pattern down bilateral legs, around to chest, to shoulders, and down arms. Intermittent radiating patterns   Pain Onset More than a month ago   Pain Frequency Constant   Aggravating Factors  Walking, otherwise unsure   Pain Relieving Factors Supine (position of comfort), ice, NSAIDS   Effect of Pain on Daily Activities Limits his ability to complete work and job related responsibilities            [facility] PT Assessment - 04/30/16 1529      Assessment   Medical Diagnosis Low back pain   Referring Provider Colleen Mahar PA-C   Onset Date/Surgical Date 03/25/16   Hand Dominance Right   Next MD Visit Multiple follow-up appointments   Prior Therapy Post surgery on R shoulder had PT, Seeing chiropractor once a week for past 5 years but not since his low back pain started     Precautions   Precautions None     Restrictions   Weight Bearing Restrictions No     Balance Screen   Has the patient fallen in the past 6 months No   Has the patient had a decrease in activity level because of a fear of falling?  Yes   Is the patient reluctant to leave their home because of a fear of falling?  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 Pt sits with almost all of his weight on his L hip and leans heavily to the L side to keep weight off R hip     ROM / Strength   AROM / PROM / Strength AROM;Strength     AROM   Overall AROM Comments Lumbar AROM flexion, extension, rotation, and lateral flexion WNL but painful in all directions. No change in  pain with repeated flexion. Peripheralization with repeated extension. Hip AROM appears grossly WFL. DTR KJ and AJ 3+ and 0 bilaterally respectively. No clonus or increase in tone noted. Painful positive reproduction of pain with CPA and R UPA L4-L5 with pain into R buttock.  Negative Ely's test, SLR, and slump      Strength   Overall Strength Within functional limits for tasks performed   Overall Strength Comments Pt demonstrates 5/5 strength for bilateral hip flexion, abduction/adduction (sitting), knee flexion, and L knee extension. 4+/5 R knee extension. 4+/5 bilateral hip IR/ER. 4+/5 L ankle DF, 4/5 R ankle DF     Palpation   Palpation comment Painful palpation to bilateral lumbar parspinals. Significant pain and spasm noted with palpation to R piriformis with reproduction of radicular symptoms down to R foot        TREATMENT  [employer] Education regarding prognosis, HEP, central sensitization with chronic pain; Pt instructed and performed seated R piriformis stretch x 30s, feedback to ensure proper muscle stretch achieved, written HEP provided with picture and instructions;                   PT Education - 04/30/16 1536     Education provided Yes   Education Details Plan of care, HEP   Person(s) Educated Patient   Methods Explanation   Comprehension Verbalized understanding             PT Long Term Goals - 05/01/16 1007      PT LONG TERM GOAL #1   Title Pt will be independent with HEP in order to decrease pain and improve strength in order to improve pain-free function at home and work.   Baseline --   Time 6   Period Weeks   Status New     PT LONG TERM GOAL #2   Title Pt will decrease mODI scoreby at least 13 points in order demonstrate clinically significant reduction in pain/disability    Baseline 04/30/16: 76%   Time 6   Period Weeks   Status New     PT LONG TERM GOAL #3   Title Pt will decrease worst low back pain to 8/10 or below with aggravating activities in order to improve pain-free function at home and work   Baseline 04/30/16: worst: 10/10   Time 6   Period Weeks   Status New               Plan - 04/30/16 1536    Clinical Impression Statement Pt is a pleasant 41 yo male referred for low back pain. Pt reports that he was performing a lot of lifting around the house on February 5th and he he started having low back pain the following day. He describes the pain as "nerve" pain as well as "achy, sharp, and dull." "It feels like I'm getting shocked." Pt reports pain occurs sometimes unilaterally in low back, sometimes alternates between sides, and sometimes concurrently bilaterally. Pain radiates around the front of his abdomen (not through), to bilateral shoulders, and down his arms. Pain occasionally radiates down both legs but pain primarily radiates down the RLE. Pt reports loss of intermittent loss bladder control since his back pain started but not loss of bowel control. He reports that he disclosed this information to his physician. Reports 22# weight loss since symptoms first began. Numbness and tingling reported in R groin near L1/L2 dermatome. PT evaluation reveals painful  lumbar mobility testing at L4-L5 with reproduction of pain into R  buttock. Pain with deep palpation to bilateral lumbar paraspinals as well as pain and spasm with palpation of R posterior hip with reproduction of radicular symptoms down to R foot. Strength appears grossly WFL bilateral with the exception of subtle weakness of R ankle dorsiflexion compared to L side. Pt with very flat affect during entire visit and describes a myriad of other symptoms that do not correlate well with typical low back pain presentation. Modified [place] 76% indicating high level of self-reported disability related to low back pain. Pt currently seeing multiple specialists for other medical complaints. Pt will benefit from skilled PT therapy to address deficits in pain in order to improve function at home and work.    Rehab Potential Fair   Clinical Impairments Affecting Rehab Potential Positives: Age, family support, Negatives: Job requirements, chronic pain, multiple physical complaints, psychosocial factors   PT Frequency 2x / week   PT Duration 6 weeks   PT Treatment/Interventions ADLs/Self Care Home Management;Therapeutic activities;Therapeutic exercise;Manual techniques;Passive range of motion;Aquatic Therapy;Electrical Stimulation;Cryotherapy;Iontophoresis 9m/ml Dexamethasone;Moist Heat;Traction;Ultrasound;Gait training;Neuromuscular re-education;Patient/family education  No Dry Needling per MD order   PT Next Visit Plan Pain control, progressive strengthening program   PT Home Exercise Plan Seated R piriformis stretch   Consulted and Agree with Plan of Care Patient      Patient will benefit from skilled therapeutic intervention in order to improve the following deficits and impairments:  Postural dysfunction, Pain, Decreased range of motion, Decreased mobility, Decreased strength, Difficulty walking, Increased muscle spasms, Impaired sensation  Visit Diagnosis: Acute bilateral low back pain with right-sided sciatica  - Plan: PT plan of care cert/re-cert     Problem List Patient Active Problem List   Diagnosis Date Noted  . Chest wall pain 03/28/2016  . 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 Hypertension 04/06/2013  . Allergic rhinitis 01/18/2013  . Family history of long QT syndrome 06/12/2012  . 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 Smoker 06/12/2012  . Common migraine 05/26/2012  . Neck pain 05/26/2012  . Fibromyalgia 05/26/2012   JPhillips GroutPT, DPT   Raymond [name],Raymond [name] 05/01/2016, 10:23 AM  [facility] [address] [place] [address]Phone: [phone]  Fax:  [phone] Name: Raymond MAYNESMRN: [id number] of Birth: 812-24-1977

## 2016-05-01 NOTE — Telephone Encounter (Signed)
Patients wife called and stated Raymond [name] needs to be set up for a procedure and they are waiting on a call. [phone]

## 2016-05-02 ENCOUNTER — Ambulatory Visit: Payer: BLUE CROSS/BLUE SHIELD | Admitting: Physical Therapy

## 2016-05-02 ENCOUNTER — Encounter: Payer: Self-pay | Admitting: Physical Therapy

## 2016-05-02 DIAGNOSIS — M5441 Lumbago with sciatica, right side: Secondary | ICD-10-CM

## 2016-05-02 DIAGNOSIS — M25511 Pain in right shoulder: Secondary | ICD-10-CM | POA: Diagnosis not present

## 2016-05-02 DIAGNOSIS — G8929 Other chronic pain: Secondary | ICD-10-CM | POA: Diagnosis not present

## 2016-05-02 DIAGNOSIS — M546 Pain in thoracic spine: Secondary | ICD-10-CM | POA: Diagnosis not present

## 2016-05-02 DIAGNOSIS — M25512 Pain in left shoulder: Secondary | ICD-10-CM | POA: Diagnosis not present

## 2016-05-02 NOTE — Therapy (Signed)
[facility] [address] [place], [address] Phone: [phone]   Fax:  [phone]  Physical Therapy Treatment  Patient Details  Name: Raymond [name] MRN: [id number] Date of Birth: 06/18/1975 Referring Provider: Karenann Cai PA-C  Encounter Date: 05/02/2016      PT End of Session - 05/02/16 1404    Visit Number 2   Number of Visits 13   Date for PT Re-Evaluation 06/11/16   PT Start Time 2703   PT Stop Time 1452   PT Time Calculation (min) 48 min   Activity Tolerance Patient tolerated treatment well   Behavior During Therapy [facility] for tasks assessed/performed      Past Medical History:  Diagnosis Date  . Biceps tendonitis on right 01/2014  . History of gastric ulcer    summer 2015  . Hypertension    has not been taking his medication; advised to start back on med. today  . 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 Shoulder impingement 01/2014   right    Past Surgical History:  Procedure Laterality Date  . CHOLECYSTECTOMY    . RESECTION DISTAL CLAVICAL Right 02/21/2014   Procedure: RESECTION DISTAL CLAVICAL;  Surgeon: Nita Sells, MD;  Location: [facility];  Service: Orthopedics;  Laterality: Right;  . SHOULDER ARTHROSCOPY WITH SUBACROMIAL DECOMPRESSION Right 02/21/2014   Procedure: SHOULDER ARTHROSCOPY WITH SUBACROMIAL DECOMPRESSION, DISTAL CLAVICAL EXCISION, DEBRIDIMENT OF PARTIAL ROTATOR CUFF TEAR;  Surgeon: Nita Sells, MD;  Location: [facility];  Service: Orthopedics;  Laterality: Right;  Right shoulder arthroscopy subacromial decompression, distal clavical excision    There were no vitals filed for this visit.      Subjective Assessment - 05/02/16 1409    Subjective Pt reports his lower back is hurting about the same since last session.  Pt has been completing his HEP since his last session with no questions or concerns. Pt reports  his bowel/bladder has not changed since last session and that his doctors remain informed about this. No new complaints or concerns.    Pertinent History Pt reports that he was lifting a bunch of things around the house on February 5th and he noticed that he started having low back pain the following day. He does not recall a particular mechanism of injury. He describes the pain as "nerve" pain as well as "achy, sharp, and dull." "It feels like I'm getting shocked." Pt reports pain occurs sometimes unilaterally in low back, sometimes alternates between sides, and sometimes concurrently bilaterally. Pain radiates around the front of his abdomen (not through), to his shoulders, and down his arms. Pain occasionally radiates down both legs but pain primarily radiates down the RLE. He reports no change in pain as the day progresses. Pain occasionally wakes him up at night. Previously he was sleeping all night. Worst pain: 10/10, Best: 4/10, Present: 6/10. Pt initially denies history of any similar pain however at other times reports that he had comparable low back pain when he was first diagnosed with fibromyalgia in 2014. Pt reports loss of intermittent loss bladder control since his back pain started but not loss of bowel control. He reports that he disclosed this information to his physician. He also complains of intermittent numbness in the R groin area, which he feels run down his right leg. Denies chills, fevers, or night sweats. Confirms weight loss of 22# since symptoms first began in [place]. Pt reports a prior history  of bilateral shoulder and neck pain which feels better when his "vertebra in upper back slips back in." He reports that chiropractic manipulation improves his shoulder pain, chest pain, and difficulty with swallowing. History of R SAD January 2016. Per patient report he had MRI of both cervical and thoracic spine without any findings. Since 03/11/16 he has had 4 ER visits, 4 PCP visits,  1  cardiology visit, 1 visit to spine specialist, and 1 GI visit. He saw GI due to dysphagia and per note plan is for upper endoscopy.    Limitations Walking   Diagnostic tests MRI of neck and thoracic spine: WNL   Patient Stated Goals Be able to work pain free and return to martial arts   Currently in Pain? Yes   Pain Score 4    Pain Location Back   Pain Orientation Lower;Mid   Pain Descriptors / Indicators Aching   Pain Type Chronic pain   Pain Onset More than a month ago   Pain Frequency Constant   Multiple Pain Sites No       TREATMENT   Manual Therapy:   STM Bil lumbar and thoracic paraspinals, R glutes x15 minutes. Significant muscular tightness appreciated in R lower thoracic paraspinals, Bil glute med/max.   CPAs T1-T8 with significant hypomobility and mild pain at T2-3 and T7. Grade III-IV 2x45 seconds each segment.     Therapeutic Exercise:  TA contractions in hooklying with verbal and tactile cues for technique. 5 second holds 2x10   Bridges 2x10 with cues for glute and core activation, pt denies pain with this   Seated scapular retractions with RTB 2x10 with verbal and tactile cues for scapular squeeze (added to HEP)  Seated R piriformis stretch 3x30 sec each LE               PT Education - 05/02/16 1403    Education provided Yes   Education Details Exercise Technique; clinical reasoning for therapeutic exercises and manual therapy   Person(s) Educated Patient   Methods Explanation;Demonstration   Comprehension Verbalized understanding;Returned demonstration;Need further instruction             PT Long Term Goals - 05/01/16 1007      PT LONG TERM GOAL #1   Title Pt will be independent with HEP in order to decrease pain and improve strength in order to improve pain-free function at home and work.   Baseline --   Time 6   Period Weeks   Status New     PT LONG TERM GOAL #2   Title Pt will decrease mODI scoreby at least 13 points in order  demonstrate clinically significant reduction in pain/disability    Baseline 04/30/16: 76%   Time 6   Period Weeks   Status New     PT LONG TERM GOAL #3   Title Pt will decrease worst low back pain to 8/10 or below with aggravating activities in order to improve pain-free function at home and work   Baseline 04/30/16: worst: 10/10   Time 6   Period Weeks   Status New               Plan - 05/02/16 1444    Clinical Impression Statement Pt presents with 4/10 pain which remains unchanged at end of session.  He completed all therapeutic exercises painfree.  He demonstrated proper recruitment of TA but poor endurance which will be addressed in future sessions.  He reported soreness following scapular retractions  "like  my muscles had not been worked there in a long time".  In addition to this, pt demonstrates significant hypomobility T2-3 and T7 with CPAs which was addressed via manual thearpy techniques.  He will benefit from continued skilled PT interventions for improved posture, core and BLE strengthening, and decreased pain for improved QOL.    Rehab Potential Fair   Clinical Impairments Affecting Rehab Potential Positives: Age, family support, Negatives: Job requirements, chronic pain, multiple physical complaints, psychosocial factors   PT Frequency 2x / week   PT Duration 6 weeks   PT Treatment/Interventions ADLs/Self Care Home Management;Therapeutic activities;Therapeutic exercise;Manual techniques;Passive range of motion;Aquatic Therapy;Electrical Stimulation;Cryotherapy;Iontophoresis 4mg /ml Dexamethasone;Moist Heat;Traction;Ultrasound;Gait training;Neuromuscular re-education;Patient/family education  No Dry Needling per MD order   PT Next Visit Plan Pain control, progressive strengthening program   PT Home Exercise Plan Seated R piriformis stretch   Consulted and Agree with Plan of Care Patient      Patient will benefit from skilled therapeutic intervention in order to improve  the following deficits and impairments:  Postural dysfunction, Pain, Decreased range of motion, Decreased mobility, Decreased strength, Difficulty walking, Increased muscle spasms, Impaired sensation  Visit Diagnosis: Acute bilateral low back pain with right-sided sciatica     Problem List Patient Active Problem List   Diagnosis Date Noted  . Chest wall pain 03/28/2016  . 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 Hypertension 04/06/2013  . Allergic rhinitis 01/18/2013  . Family history of long QT syndrome 06/12/2012  . 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 Smoker 06/12/2012  . Common migraine 05/26/2012  . Neck pain 05/26/2012  . Fibromyalgia 05/26/2012     Collie Siad PT, DPT 05/02/2016, 3:07 PM  [facility] [address] [place], [address] Phone: [phone]   Fax:  [phone]  Name: Raymond [name] MRN: [id number] Date of Birth: Oct 11, 1975

## 2016-05-02 NOTE — Telephone Encounter (Signed)
Pt scheduled for an EGD at [facility] with [place] on 05/14/16. Please precert for dysphagia Z60.10.

## 2016-05-03 ENCOUNTER — Ambulatory Visit (INDEPENDENT_AMBULATORY_CARE_PROVIDER_SITE_OTHER): Payer: BLUE CROSS/BLUE SHIELD | Admitting: Family Medicine

## 2016-05-03 ENCOUNTER — Encounter: Payer: Self-pay | Admitting: Family Medicine

## 2016-05-03 VITALS — BP 128/76 | HR 76 | Temp 98.3°F | Ht 68.0 in | Wt 154.0 lb

## 2016-05-03 DIAGNOSIS — M545 Low back pain, unspecified: Secondary | ICD-10-CM

## 2016-05-03 MED ORDER — CYCLOBENZAPRINE HCL 10 MG PO TABS: 10.0000 mg | ORAL_TABLET | Freq: Every day | ORAL | 1 refills | Status: DC

## 2016-05-03 NOTE — Progress Notes (Signed)
Dr. Frederico Hamman T. Aayansh Codispoti, MD, [facility] [place], [address] Phone: [phone] Fax: [phone]  05/03/2016  Patient: Raymond [name], MRN: [id number], DOB: 17-Nov-1975, 41 y.o.  Primary Physician:  [name], MD   Chief Complaint  Patient presents with  . Back Pain    low back pain   Subjective:   Raymond [name] is a 41 y.o. very pleasant male patient who presents with the following: Back Pain  ongoing for approximately: 2 weeks specific to low back The patient has had back pain before. The back pain is localized into the lumbar spine area. They also describe no radiculopathy.  Now pain in low back.  Was getting some PT.  Has just seen Dr. Towanda Malkin office.   No numbness or tingling. No bowel or bladder incontinence. No focal weakness. Prior interventions: PT, ESI, chiro Physical therapy:y Chiropractic manipulations: y Acupuncture: No Osteopathic manipulation: No Heat or cold: Minimal effect  Past Medical History, Surgical History, Family History, Medications, Allergies have been reviewed and updated if relevant.  Patient Active Problem List   Diagnosis Date Noted  . 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 Hypertension 04/06/2013  . Allergic rhinitis 01/18/2013  . Family history of long QT syndrome 06/12/2012  . 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 Smoker 06/12/2012  . Common migraine 05/26/2012  . Neck pain 05/26/2012  . Fibromyalgia 05/26/2012    Past Medical History:  Diagnosis Date  . Biceps tendonitis on right 01/2014  . History of gastric ulcer    summer 2015  . Hypertension    has not been taking his medication; advised to start back on med. today  . 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 Shoulder impingement 01/2014   right    Past Surgical History:  Procedure Laterality Date  . CHOLECYSTECTOMY    . RESECTION DISTAL  CLAVICAL Right 02/21/2014   Procedure: RESECTION DISTAL CLAVICAL;  Surgeon: Nita Sells, MD;  Location: [facility];  Service: Orthopedics;  Laterality: Right;  . SHOULDER ARTHROSCOPY WITH SUBACROMIAL DECOMPRESSION Right 02/21/2014   Procedure: SHOULDER ARTHROSCOPY WITH SUBACROMIAL DECOMPRESSION, DISTAL CLAVICAL EXCISION, DEBRIDIMENT OF PARTIAL ROTATOR CUFF TEAR;  Surgeon: Nita Sells, MD;  Location: [facility];  Service: Orthopedics;  Laterality: Right;  Right shoulder arthroscopy subacromial decompression, distal clavical excision    Social History   Social History  . Marital status: Married    Spouse name: N/A  . Number of children: N/A  . Years of education: N/A   Occupational History  . electrician Programme researcher, broadcasting/film/video   Social History Main Topics  . Smoking status: Former Smoker    Quit date: 09/24/2013  . Smokeless tobacco: Never Used  . Alcohol use No  . Drug use: No  . Sexual activity: Not on file   Other Topics Concern  . Not on file   Social History Narrative   Mom was in hospital with long QT syndrome, then heart in A Fib.   Sister has long QT syndrome   1st cousin: long QT syndrome          Family History  Problem Relation Age of Onset  . Diabetes Mother   . Heart disease Mother   . Long QT syndrome Mother   . Lung disease Father   . Diabetes Father     Allergies  Allergen Reactions  . Azithromycin Nausea And Vomiting  .  Percocet [Oxycodone-Acetaminophen] Hives  . Vicodin [Hydrocodone-Acetaminophen] Hives  . Adhesive [Tape] Other (See Comments)    SKIN IRRITATION    Medication list reviewed and updated in full in [place].  GEN: No fevers, chills. Nontoxic. Primarily MSK c/o today. MSK: Detailed in the HPI GI: tolerating PO intake without difficulty Neuro: As above  Otherwise the pertinent positives of the ROS are noted above.    Objective:   Blood pressure 128/76, pulse 76, temperature 98.3  F (36.8 C), temperature source Oral, height 5\' 8"  (1.727 m), weight 154 lb (69.9 kg), SpO2 97 %.  Gen: Well-developed,well-nourished,in no acute distress; alert,appropriate and cooperative throughout examination HEENT: Normocephalic and atraumatic without obvious abnormalities.  Ears, externally no deformities Pulm: Breathing comfortably in no respiratory distress Range of motion at  the waist: Flexion, rotation and lateral bending: full  No echymosis or edema Rises to examination table with no difficulty Gait: minimally antalgic  Inspection/Deformity: No abnormality Paraspinus T:  L2-s1  B Ankle Dorsiflexion (L5,4): 5/5 B Great Toe Dorsiflexion (L5,4): 5/5 Heel Walk (L5): WNL Toe Walk (S1): WNL Rise/Squat (L4): WNL, mild pain  SENSORY B Medial Foot (L4): WNL B Dorsum (L5): WNL B Lateral (S1): WNL Light Touch: WNL Pinprick: WNL  REFLEXES Knee (L4): 2+ Ankle (S1): 2+  B SLR, seated: neg B SLR, supine: neg B FABER: neg B Reverse FABER: neg B Greater Troch: NT B Log Roll: neg B Stork: NT B Sciatic Notch: NT  Radiology: Prior films reviewed.  Assessment and Plan:   Bilateral low back pain without sciatica, unspecified chronicity  Anatomy reviewed. Conservative algorithms for acute back pain generally begin with the following: NSAIDS, Muscle Relaxants, Mild pain medication  Start with medications, core rehab, and progress from there following low back pain algorithm. No red flags are present. Add in piriformis rehab  Follow-up: No Follow-up on file.  Meds ordered this encounter  Medications  . cyclobenzaprine (FLEXERIL) 10 MG tablet    Sig: Take 1 tablet (10 mg total) by mouth at bedtime.    Dispense:  30 tablet    Refill:  1   Signed,  Lene Mckay T. Carsyn Boster, MD   Allergies as of 05/03/2016      Reactions   Azithromycin Nausea And Vomiting   Percocet [oxycodone-acetaminophen] Hives   Vicodin [hydrocodone-acetaminophen] Hives   Adhesive [tape] Other (See  Comments)   SKIN IRRITATION      Medication List       Accurate as of 05/03/16  9:38 AM. Always use your most recent med list.          amphetamine-dextroamphetamine 15 MG tablet Commonly known as:  ADDERALL Take 1 tablet by mouth 2 (two) times daily with a meal.   cetirizine-pseudoephedrine 5-120 MG tablet Commonly known as:  ZYRTEC-D Take 1 tablet by mouth once a week.   cyclobenzaprine 10 MG tablet Commonly known as:  FLEXERIL Take 1 tablet (10 mg total) by mouth at bedtime.   escitalopram 10 MG tablet Commonly known as:  LEXAPRO Take 1 tablet (10 mg total) by mouth daily.   Eszopiclone 3 MG Tabs TAKE 1 TABLET BY MOUTH 30 MINUTES PRIOR TO BEDTIME   fluticasone 50 MCG/ACT nasal spray Commonly known as:  FLONASE USE 2 SPRAYS IN EACH NOSTRIL EVERY DAY   hydrochlorothiazide 12.5 MG capsule Commonly known as:  MICROZIDE TAKE ONE CAPSULE BY MOUTH EVERY DAY   lidocaine 5 % Commonly known as:  LIDODERM Place 1 patch onto the skin daily. Remove &  Discard patch within 12 hours or as directed by MD   naproxen 500 MG tablet Commonly known as:  NAPROSYN Take 1 tablet (500 mg total) by mouth 2 (two) times daily.   omeprazole 20 MG capsule Commonly known as:  PRILOSEC Take 20 mg by mouth daily.   VITAMIN D PO Take 5,000 mg by mouth daily.

## 2016-05-03 NOTE — Progress Notes (Signed)
Pre visit review using our clinic review tool, if applicable. No additional management support is needed unless otherwise documented below in the visit note. 

## 2016-05-04 DIAGNOSIS — M25511 Pain in right shoulder: Secondary | ICD-10-CM | POA: Diagnosis not present

## 2016-05-06 ENCOUNTER — Other Ambulatory Visit: Payer: Self-pay

## 2016-05-06 ENCOUNTER — Telehealth: Payer: Self-pay

## 2016-05-06 ENCOUNTER — Ambulatory Visit: Payer: BLUE CROSS/BLUE SHIELD

## 2016-05-06 DIAGNOSIS — M25512 Pain in left shoulder: Secondary | ICD-10-CM | POA: Diagnosis not present

## 2016-05-06 DIAGNOSIS — M546 Pain in thoracic spine: Secondary | ICD-10-CM

## 2016-05-06 DIAGNOSIS — M25511 Pain in right shoulder: Secondary | ICD-10-CM | POA: Diagnosis not present

## 2016-05-06 DIAGNOSIS — M5441 Lumbago with sciatica, right side: Secondary | ICD-10-CM | POA: Diagnosis not present

## 2016-05-06 DIAGNOSIS — G8929 Other chronic pain: Secondary | ICD-10-CM | POA: Diagnosis not present

## 2016-05-06 DIAGNOSIS — K921 Melena: Secondary | ICD-10-CM

## 2016-05-06 NOTE — Telephone Encounter (Signed)
Raymond [name] at [facility] PT left v/m requesting cb from Dr Lorelei Pont to clarify different orders received for  Physical therapy and also discuss plan of care.

## 2016-05-06 NOTE — Telephone Encounter (Signed)
I have not made any physical therapy referrals for this patient.   The EPIC documentation shows that Danielle from Dr. Tamera Punt - his shoulder surgeon - recently placed orders.  [facility] also placed some PT orders recently.  I have not in a few years.

## 2016-05-07 NOTE — Telephone Encounter (Signed)
Spoke with Corene Cornea at [facility].  He states he had wanted to speak with Dr. Lorelei Pont to get some clarification as to what was going on with Mr. Patrick North.  He has gotten multiple referrals from different offices for PT and each on is for a different body part.  But since calling our office he has spoken with Mrs. Highley and he thinks he has relatively got it straighten out.  He states he will work with Mr. Moan on his whole back but not sure how successful he will be.  He is also wondering if a lot of his symptoms are increased by anxiety.  He states he will call us back if he has any more questions.

## 2016-05-07 NOTE — Therapy (Signed)
[facility] [address], [place], [address] Phone: [phone]   Fax:  [phone]  Physical Therapy Treatment  Patient Details  Name: Raymond [name] MRN: [id number] Date of Birth: [birth year] Referring Provider: Karenann Cai PA-C  Encounter Date: 05/06/2016      PT End of Session - 05/07/16 0952    Visit Number 3   Number of Visits 13   Date for PT Re-Evaluation 06/11/16   PT Start Time 1310   PT Stop Time 1415   PT Time Calculation (min) 65 min   Activity Tolerance Patient tolerated treatment well   Behavior During Therapy [facility] for tasks assessed/performed      Past Medical History:  Diagnosis Date  . Biceps tendonitis on right 01/2014  . History of gastric ulcer    summer 2015  . Hypertension    has not been taking his medication; advised to start back on med. today  . 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 Shoulder impingement 01/2014   right    Past Surgical History:  Procedure Laterality Date  . CHOLECYSTECTOMY    . RESECTION DISTAL CLAVICAL Right 02/21/2014   Procedure: RESECTION DISTAL CLAVICAL;  Surgeon: Nita Sells, MD;  Location: [facility];  Service: Orthopedics;  Laterality: Right;  . SHOULDER ARTHROSCOPY WITH SUBACROMIAL DECOMPRESSION Right 02/21/2014   Procedure: SHOULDER ARTHROSCOPY WITH SUBACROMIAL DECOMPRESSION, DISTAL CLAVICAL EXCISION, DEBRIDIMENT OF PARTIAL ROTATOR CUFF TEAR;  Surgeon: Nita Sells, MD;  Location: [facility];  Service: Orthopedics;  Laterality: Right;  Right shoulder arthroscopy subacromial decompression, distal clavical excision    There were no vitals filed for this visit.      Subjective Assessment - 05/06/16 1326    Subjective Pt arrived with a new order for bilateral pec strain from a physician assistant at [facility]. Pt reports that he saw Dr. Edilia Bo recently to discuss his  low back. He also had a follow-up appointment at [facility] and saw a physician assistant who believed that a lot of his upper back and chest pain might be due to bilateral pec minor strain from over stretching.    Pertinent History Pt reports that he was lifting a bunch of things around the house on February 5th and he noticed that he started having low back pain the following day. He does not recall a particular mechanism of injury. He describes the pain as "nerve" pain as well as "achy, sharp, and dull." "It feels like I'm getting shocked." Pt reports pain occurs sometimes unilaterally in low back, sometimes alternates between sides, and sometimes concurrently bilaterally. Pain radiates around the front of his abdomen (not through), to his shoulders, and down his arms. Pain occasionally radiates down both legs but pain primarily radiates down the RLE. He reports no change in pain as the day progresses. Pain occasionally wakes him up at night. Previously he was sleeping all night. Worst pain: 10/10, Best: 4/10, Present: 6/10. Pt initially denies history of any similar pain however at other times reports that he had comparable low back pain when he was first diagnosed with fibromyalgia in 2014. Pt reports loss of intermittent loss bladder control since his back pain started but not loss of bowel control. He reports that he disclosed this information to his physician. He also complains of intermittent numbness in the R groin area, which he feels run down his right leg. Denies chills, fevers, or night sweats. Confirms  weight loss of 22# since symptoms first began in [place]. Pt reports a prior history of bilateral shoulder and neck pain which feels better when his "vertebra in upper back slips back in." He reports that chiropractic manipulation improves his shoulder pain, chest pain, and difficulty with swallowing. History of R SAD January 2016. Per patient report he had MRI of both cervical and  thoracic spine without any findings. Since 03/11/16 he has had 4 ER visits, 4 PCP visits,  1 cardiology visit, 1 visit to spine specialist, and 1 GI visit. He saw GI due to dysphagia and per note plan is for upper endoscopy.    Limitations Walking   Diagnostic tests MRI of neck and thoracic spine: WNL   Patient Stated Goals Be able to work pain free and return to martial arts   Currently in Pain? Yes   Pain Score 2    Pain Location Back   Pain Orientation Upper   Pain Onset More than a month ago         TREATMENT  Estim Hi[employer]olt electrical stimulation to patient tolerated intensity along bilateral lower lumbar paraspinals, goal to decrease spasm and reduce pain. During electrical stimulation continual symptom monitoring performed. Also worked to attempt to reconcile patient's history, multiple complaints, and clarify orders;  Manual Therapy Cervical spine mobility assessed posterior to anterior without positive reproduction of back or shoulder pain; Thoracic mobility assessed with positive reproduction of upper back and periscapular pain with CPA at T5-T7; CPA performed T5-T7, grade III-IV, 30s/bout, 3 bouts/level with gradually reducing pain; Gentle STM performed along mid trap and rhomboids with some trigger points noted;  Ther-ex Palpation of pec minor performed both through pec major and attempting to access laterally under pec minor. Pt reports mild pain bilaterally, more with palpation anterior to posterior through pec major. No positive reproduction of more centralized chest pain bilaterally just lateral to sternum; No significant shortening of pec minor noted or reproduction of chest pain with posterior tipping of scapular or supine pec minor stretch; Reviewed seated scapular retraction with therapist with patient, encouraged to limit bilateral shoulder extension to mid axilla.                        PT Education - 05/07/16 [id number]    Education provided Yes    Education Details Plan of care clarification   Person(s) Educated Patient   Methods Explanation   Comprehension Verbalized understanding             PT Long Term Goals - 05/01/16 1007      PT LONG TERM GOAL #1   Title Pt will be independent with HEP in order to decrease pain and improve strength in order to improve pain-free function at home and work.   Baseline --   Time 6   Period Weeks   Status New     PT LONG TERM GOAL #2   Title Pt will decrease mODI scoreby at least 13 points in order demonstrate clinically significant reduction in pain/disability    Baseline 04/30/16: 76%   Time 6   Period Weeks   Status New     PT LONG TERM GOAL #3   Title Pt will decrease worst low back pain to 8/10 or below with aggravating activities in order to improve pain-free function at home and work   Baseline 04/30/16: worst: 10/10   Time 6   Period Weeks   Status New  Plan - 05/07/16 4259    Clinical Impression Statement Pt arrived later for his appointment today with a new order for bilateral pec minor strain. At this time, our office has received 3 orders for physical therapy since 03/21/16 all with different diagnoses and instructions. First order was received 03/21/16 for R shoulder and trap/periscapular pain with dry needling from Dr. Tamera Punt at [facility]. Pt came for an initial evaluation on 03/21/16 but never returned for follow-up visits. Second order received 04/30/16 for thoracic spine pain from [facility] PA-C [facility] with NO Dry Needling. Third order came from Grier Mitts PA-C at [facility] for bilateral pec minor strain with orders for dry needling. During patient's second evaluation on 04/30/16 (first performed on 03/21/16, never returned for follow-up) his primary complaint was acute onset low back pain which started on February 5th. Since 03/11/16 pt has had 4 ER visits, 5 PCP  visits, 1 cardiology visit, 1 visit to spine specialist, 1 GI visit, and 1 visit with orthopedics. Dr. Edilia Bo (PCP) most recently saw him for his low back pain and it does not appear that pt reported any of the same symptoms that he reported during PT evaluation including intermittent loss bladder control, intermittent numbness in the R groin area running into his right leg, and weight loss of 22# since symptoms first began February 5th. Pt now reports that he would like therapy to work on his entire back but focus on his upper back. At this point it is unclear exactly what therapy is treating and who is coordinating his care related to his current pain. It also does not appear that pt is consistent with his history among all of his providers. He appears to have a significant level of anxiety regarding his myriad of symptoms, which is driving him to seek care from multiple providers. He also has a known diagnosis of fibromyalgia which may be a component of his current pain complaints. At this time, physical therapy will work on both his thoracic and low back pain as well as possible pec minor strain. Pt only reports mild pain with palpation and stretch of pec minor bilaterally on this date. It does not appear to positively reproduce his more centralized chest pain of which he is concerned. Pt reports no increase in pain with intervention today. Encouraged to continue current home exercise program and follow-up as scheduled.     Rehab Potential Fair   Clinical Impairments Affecting Rehab Potential Positives: Age, family support, Negatives: Job requirements, chronic pain, multiple physical complaints, psychosocial factors   PT Frequency 2x / week   PT Duration 6 weeks   PT Treatment/Interventions ADLs/Self Care Home Management;Therapeutic activities;Therapeutic exercise;Manual techniques;Passive range of motion;Aquatic Therapy;Electrical Stimulation;Cryotherapy;Iontophoresis 66m/ml Dexamethasone;Moist  Heat;Traction;Ultrasound;Gait training;Neuromuscular re-education;Patient/family education  No Dry Needling per MD order   PT Next Visit Plan Pain control as needed with modalities, thoracic mobilizations, progressive strengthening program,   PT Home Exercise Plan Seated R piriformis stretch, seated scapular retraction (avoid shoulder hyperextension)   Consulted and Agree with Plan of Care Patient      Patient will benefit from skilled therapeutic intervention in order to improve the following deficits and impairments:  Postural dysfunction, Pain, Decreased range of motion, Decreased mobility, Decreased strength, Difficulty walking, Increased muscle spasms, Impaired sensation  Visit Diagnosis: Acute bilateral low back pain with right-sided sciatica  Pain in thoracic spine     Problem List Patient Active Problem List   Diagnosis Date Noted  .  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 Hypertension 04/06/2013  . Allergic rhinitis 01/18/2013  . Family history of long QT syndrome 06/12/2012  . 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 Smoker 06/12/2012  . Common migraine 05/26/2012  . Neck pain 05/26/2012  . Fibromyalgia 05/26/2012   Phillips Grout PT, DPT   Huprich,Jason 05/07/2016, 10:38 AM  [facility] [address] [place], [address] Phone: [phone]   Fax:  [phone]  Name: Raymond [name] MRN: [id number] Date of Birth: 1975/05/18

## 2016-05-07 NOTE — Telephone Encounter (Signed)
Yes - we recently started him on treatment for anxiety. He seemed improved at his last office visit.

## 2016-05-08 ENCOUNTER — Ambulatory Visit: Payer: BLUE CROSS/BLUE SHIELD

## 2016-05-08 DIAGNOSIS — M25511 Pain in right shoulder: Secondary | ICD-10-CM

## 2016-05-08 DIAGNOSIS — M25512 Pain in left shoulder: Secondary | ICD-10-CM | POA: Diagnosis not present

## 2016-05-08 DIAGNOSIS — M546 Pain in thoracic spine: Secondary | ICD-10-CM | POA: Diagnosis not present

## 2016-05-08 DIAGNOSIS — G8929 Other chronic pain: Secondary | ICD-10-CM

## 2016-05-08 DIAGNOSIS — M5441 Lumbago with sciatica, right side: Secondary | ICD-10-CM | POA: Diagnosis not present

## 2016-05-08 NOTE — Therapy (Signed)
[facility] [address], [place], [address] Phone: [phone]   Fax:  [phone]  Physical Therapy Treatment  Patient Details  Name: Raymond [name] MRN: [id number] Date of Birth: 01-17-1976 Referring Provider: Karenann Cai PA-C  Encounter Date: 05/08/2016      PT End of Session - 05/08/16 1415    Visit Number 4   Number of Visits 13   Date for PT Re-Evaluation 06/11/16   PT Start Time 5035   PT Stop Time 1448   PT Time Calculation (min) 43 min   Activity Tolerance Patient tolerated treatment well   Behavior During Therapy [facility] for tasks assessed/performed      Past Medical History:  Diagnosis Date  . Biceps tendonitis on right 01/2014  . History of gastric ulcer    summer 2015  . Hypertension    has not been taking his medication; advised to start back on med. today  . 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 Shoulder impingement 01/2014   right    Past Surgical History:  Procedure Laterality Date  . CHOLECYSTECTOMY    . RESECTION DISTAL CLAVICAL Right 02/21/2014   Procedure: RESECTION DISTAL CLAVICAL;  Surgeon: Nita Sells, MD;  Location: [place];  Service: Orthopedics;  Laterality: Right;  . SHOULDER ARTHROSCOPY WITH SUBACROMIAL DECOMPRESSION Right 02/21/2014   Procedure: SHOULDER ARTHROSCOPY WITH SUBACROMIAL DECOMPRESSION, DISTAL CLAVICAL EXCISION, DEBRIDIMENT OF PARTIAL ROTATOR CUFF TEAR;  Surgeon: Nita Sells, MD;  Location: [facility];  Service: Orthopedics;  Laterality: Right;  Right shoulder arthroscopy subacromial decompression, distal clavical excision    There were no vitals filed for this visit.      Subjective Assessment - 05/08/16 1413    Subjective Pt reports he is having upper back and chest pain today. He states that the pain started after he performed the seated rows last night. No specific questions at this time.     Pertinent History Pt reports that he was lifting a bunch of things around the house on February 5th and he noticed that he started having low back pain the following day. He does not recall a particular mechanism of injury. He describes the pain as "nerve" pain as well as "achy, sharp, and dull." "It feels like I'm getting shocked." Pt reports pain occurs sometimes unilaterally in low back, sometimes alternates between sides, and sometimes concurrently bilaterally. Pain radiates around the front of his abdomen (not through), to his shoulders, and down his arms. Pain occasionally radiates down both legs but pain primarily radiates down the RLE. He reports no change in pain as the day progresses. Pain occasionally wakes him up at night. Previously he was sleeping all night. Worst pain: 10/10, Best: 4/10, Present: 6/10. Pt initially denies history of any similar pain however at other times reports that he had comparable low back pain when he was first diagnosed with fibromyalgia in 2014. Pt reports loss of intermittent loss bladder control since his back pain started but not loss of bowel control. He reports that he disclosed this information to his physician. He also complains of intermittent numbness in the R groin area, which he feels run down his right leg. Denies chills, fevers, or night sweats. Confirms weight loss of 22# since symptoms first began in [place]. Pt reports a prior history of bilateral shoulder and neck pain which feels better when his "vertebra in upper back slips back in." He reports that  chiropractic manipulation improves his shoulder pain, chest pain, and difficulty with swallowing. History of R SAD January 2016. Per patient report he had MRI of both cervical and thoracic spine without any findings. Since 03/11/16 he has had 4 ER visits, 4 PCP visits,  1 cardiology visit, 1 visit to spine specialist, and 1 GI visit. He saw GI due to dysphagia and per note plan is for upper endoscopy.     Limitations Walking   Diagnostic tests MRI of neck and thoracic spine: WNL   Patient Stated Goals Be able to work pain free and return to martial arts   Currently in Pain? Yes   Pain Score 3    Pain Location Chest   Pain Orientation Right;Left   Pain Descriptors / Indicators Sharp   Pain Type Chronic pain   Pain Onset More than a month ago   Multiple Pain Sites Yes   Pain Score 2   Pain Location Back   Pain Orientation Right;Left;Upper   Pain Descriptors / Indicators Sharp   Pain Type Chronic pain        TREATMENT  Manual Therapy CPA performed T4-T7, grade III-IV, 30s/bout, 3 bouts/level with gradually reducing pain, a few unintentional cavitations with grade IV mobilizations; Foam rolling for thoracic spine soft tissue mobilization; Foam roll utilized for repeated thoracic extension and rotation in supine at multiple levels throughout thoracic spine; Gentle STM performed along mid trap and rhomboids; Gentle STM to pec major and minor, pec minor access through pec major as well as laterally under pec minor near axilla; Modified HEP, discontinue all pec stretches and rows, pt provided written HEP with directions for foam rolling T spine, t spine extension over roll, pec STM with roll or MF release ball/tennis ball;                         PT Education - 05/08/16 1415    Education provided Yes   Education Details New HEP issued, discontinue rows   Person(s) Educated Patient   Methods Explanation   Comprehension Verbalized understanding             PT Long Term Goals - 05/01/16 1007      PT LONG TERM GOAL #1   Title Pt will be independent with HEP in order to decrease pain and improve strength in order to improve pain-free function at home and work.   Baseline --   Time 6   Period Weeks   Status New     PT LONG TERM GOAL #2   Title Pt will decrease mODI scoreby at least 13 points in order demonstrate clinically significant reduction in  pain/disability    Baseline 04/30/16: 76%   Time 6   Period Weeks   Status New     PT LONG TERM GOAL #3   Title Pt will decrease worst low back pain to 8/10 or below with aggravating activities in order to improve pain-free function at home and work   Baseline 04/30/16: worst: 10/10   Time 6   Period Weeks   Status New               Plan - 05/08/16 1415    Clinical Impression Statement Pt not overly tender again today with palpation of chest and upper back. He responds well to thoracic mobilizations with infreqent unintentional cavitation. Pt encouraged to discontinue seated rows. Added foam rolling for thoracic spine and chest as well as thoracic extension over  roller with elbows together. Can continue with piriformis stretching. Pt is very anxious today about whether he "tore" his pec muscle. Reassurance provided. Introductory education today performed regarding anxiety and symptom magnification. Follow-up as scheduled.    Rehab Potential Fair   Clinical Impairments Affecting Rehab Potential Positives: Age, family support, Negatives: Job requirements, chronic pain, multiple physical complaints, psychosocial factors   PT Frequency 2x / week   PT Duration 6 weeks   PT Treatment/Interventions ADLs/Self Care Home Management;Therapeutic activities;Therapeutic exercise;Manual techniques;Passive range of motion;Aquatic Therapy;Electrical Stimulation;Cryotherapy;Iontophoresis 4mg /ml Dexamethasone;Moist Heat;Traction;Ultrasound;Gait training;Neuromuscular re-education;Patient/family education  No Dry Needling per MD order   PT Next Visit Plan Pain control as needed with modalities, thoracic mobilizations, progressive strengthening program,   PT Home Exercise Plan Seated R piriformis stretch, thoracic extension over foam roller, foam rolling or MF release ball/tennis ball for pec; Discontinue all rows and pec stretches   Consulted and Agree with Plan of Care Patient      Patient will  benefit from skilled therapeutic intervention in order to improve the following deficits and impairments:  Postural dysfunction, Pain, Decreased range of motion, Decreased mobility, Decreased strength, Difficulty walking, Increased muscle spasms, Impaired sensation  Visit Diagnosis: Pain in thoracic spine  Chronic pain of both shoulders     Problem List Patient Active Problem List   Diagnosis Date Noted  . 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 Hypertension 04/06/2013  . Allergic rhinitis 01/18/2013  . Family history of long QT syndrome 06/12/2012  . 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 Smoker 06/12/2012  . Common migraine 05/26/2012  . Neck pain 05/26/2012  . Fibromyalgia 05/26/2012   Phillips Grout PT, DPT   Hartley Wyke 05/08/2016, 3:19 PM  [facility] [address], [place], [address] Phone: [phone]   Fax:  [phone]  Name: Raymond [name] MRN: [id number] Date of Birth: 15-Aug-1975

## 2016-05-10 ENCOUNTER — Other Ambulatory Visit: Payer: Self-pay

## 2016-05-10 MED ORDER — NA SULFATE-K SULFATE-MG SULF 17.5-3.13-1.6 GM/177ML PO SOLN: 1.0000 | ORAL | 0 refills | Status: DC

## 2016-05-13 ENCOUNTER — Ambulatory Visit: Payer: BLUE CROSS/BLUE SHIELD

## 2016-05-13 DIAGNOSIS — M25511 Pain in right shoulder: Secondary | ICD-10-CM | POA: Diagnosis not present

## 2016-05-13 DIAGNOSIS — M5441 Lumbago with sciatica, right side: Secondary | ICD-10-CM | POA: Diagnosis not present

## 2016-05-13 DIAGNOSIS — M546 Pain in thoracic spine: Secondary | ICD-10-CM

## 2016-05-13 DIAGNOSIS — M25512 Pain in left shoulder: Secondary | ICD-10-CM | POA: Diagnosis not present

## 2016-05-13 DIAGNOSIS — G8929 Other chronic pain: Secondary | ICD-10-CM | POA: Diagnosis not present

## 2016-05-13 NOTE — Therapy (Signed)
[facility] [address], [place], [address] Phone: [phone]   Fax:  [phone]  Physical Therapy Treatment  Patient Details  Name: Raymond [name] MRN: [id number] Date of Birth: 25-Sep-1975 Referring Provider: Karenann Cai PA-C  Encounter Date: 05/13/2016      PT End of Session - 05/13/16 0918    Visit Number 5   Number of Visits 13   Date for PT Re-Evaluation 06/11/16   PT Start Time 0900   PT Stop Time 0945   PT Time Calculation (min) 45 min   Activity Tolerance Patient tolerated treatment well   Behavior During Therapy [facility] for tasks assessed/performed      Past Medical History:  Diagnosis Date  . Biceps tendonitis on right 01/2014  . History of gastric ulcer    summer 2015  . Hypertension    has not been taking his medication; advised to start back on med. today  . 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 Shoulder impingement 01/2014   right    Past Surgical History:  Procedure Laterality Date  . CHOLECYSTECTOMY    . RESECTION DISTAL CLAVICAL Right 02/21/2014   Procedure: RESECTION DISTAL CLAVICAL;  Surgeon: Nita Sells, MD;  Location: [place];  Service: Orthopedics;  Laterality: Right;  . SHOULDER ARTHROSCOPY WITH SUBACROMIAL DECOMPRESSION Right 02/21/2014   Procedure: SHOULDER ARTHROSCOPY WITH SUBACROMIAL DECOMPRESSION, DISTAL CLAVICAL EXCISION, DEBRIDIMENT OF PARTIAL ROTATOR CUFF TEAR;  Surgeon: Nita Sells, MD;  Location: [facility];  Service: Orthopedics;  Laterality: Right;  Right shoulder arthroscopy subacromial decompression, distal clavical excision    There were no vitals filed for this visit.      Subjective Assessment - 05/13/16 0915    Subjective Pt reports that he is doing well at this time. He reports approximately 30% improvement in upper back pain since starting therapy. He reports persistent chest pain which  has not seemed to improve with therapy. He used his therapy ball to perform self pec massage since last appointment. Reports soreness following self massage but he used ice to decrease his pain afterward. No specific questions or concerns at this time.    Pertinent History Pt reports that he was lifting a bunch of things around the house on February 5th and he noticed that he started having low back pain the following day. He does not recall a particular mechanism of injury. He describes the pain as "nerve" pain as well as "achy, sharp, and dull." "It feels like I'm getting shocked." Pt reports pain occurs sometimes unilaterally in low back, sometimes alternates between sides, and sometimes concurrently bilaterally. Pain radiates around the front of his abdomen (not through), to his shoulders, and down his arms. Pain occasionally radiates down both legs but pain primarily radiates down the RLE. He reports no change in pain as the day progresses. Pain occasionally wakes him up at night. Previously he was sleeping all night. Worst pain: 10/10, Best: 4/10, Present: 6/10. Pt initially denies history of any similar pain however at other times reports that he had comparable low back pain when he was first diagnosed with fibromyalgia in 2014. Pt reports loss of intermittent loss bladder control since his back pain started but not loss of bowel control. He reports that he disclosed this information to his physician. He also complains of intermittent numbness in the R groin area, which he feels run down his right leg. Denies chills, fevers, or  night sweats. Confirms weight loss of 22# since symptoms first began in [place]. Pt reports a prior history of bilateral shoulder and neck pain which feels better when his "vertebra in upper back slips back in." He reports that chiropractic manipulation improves his shoulder pain, chest pain, and difficulty with swallowing. History of R SAD January 2016. Per patient report he had MRI  of both cervical and thoracic spine without any findings. Since 03/11/16 he has had 4 ER visits, 4 PCP visits,  1 cardiology visit, 1 visit to spine specialist, and 1 GI visit. He saw GI due to dysphagia and per note plan is for upper endoscopy.    Limitations Walking   Diagnostic tests MRI of neck and thoracic spine: WNL   Patient Stated Goals Be able to work pain free and return to martial arts   Currently in Pain? Yes   Pain Score 3    Pain Location Chest   Pain Orientation Right;Left   Pain Descriptors / Indicators Sharp   Pain Type Chronic pain   Pain Onset More than a month ago   Multiple Pain Sites Yes   Pain Score 3   Pain Location Back   Pain Orientation Right;Left;Upper   Pain Descriptors / Indicators Sharp   Pain Type Chronic pain   Pain Onset More than a month ago        TREATMENT   Estim [employer] electrical stimulation to patient tolerated intensity (265V one side, 235V other side) along bilateral thoracic paraspinals, goal to decrease spasm and reduce pain. Moist heat pack applied to thoracic spine concurrently with electrical stimulation. Mild superficial reddening from heat noted but no adverse reaction to electrodes or stimulation;   Manual Therapy CPA performed T4-T7, grade III-IV, 30s/bout, 3 bouts/level, no cavitations noted on this date; Gentle STM performed along mid trap and rhomboids bilaterally with instrument assist, fanning and sweeping strokes utilized with tool; Gentle STM to pec major and minor bilaterally with instrument assist, fanning and sweeping strokes utilized with tool; Reinforced HEP, continue to avoid all pec stretches and rows, only perform LIGHT STM with MF ball at home, ice pecs frequently, continue with antiinflammatories as prescribed by PCP;                       PT Education - 05/13/16 0917    Education provided Yes   Education Details HEP reviewed with patient   Person(s) Educated Patient   Methods Explanation    Comprehension Verbalized understanding             PT Long Term Goals - 05/01/16 1007      PT LONG TERM GOAL #1   Title Pt will be independent with HEP in order to decrease pain and improve strength in order to improve pain-free function at home and work.   Baseline --   Time 6   Period Weeks   Status New     PT LONG TERM GOAL #2   Title Pt will decrease mODI scoreby at least 13 points in order demonstrate clinically significant reduction in pain/disability    Baseline 04/30/16: 76%   Time 6   Period Weeks   Status New     PT LONG TERM GOAL #3   Title Pt will decrease worst low back pain to 8/10 or below with aggravating activities in order to improve pain-free function at home and work   Baseline 04/30/16: worst: 10/10   Time 6   Period Weeks  Status New               Plan - 05/13/16 6195    Clinical Impression Statement Pt with some mild spasms around periscapular musculature on this date, more on the right side. Encouraged pt to avoid any activities which stretch or lengthen the pecs. Continue with LIGHT self massage to pecs and focus on frequent icing. Continue with thoracic STM and mobilizations using foam roller. Follow-up as scheduled. No HEP modification made on this date however pt advised he can perform combined rotation and extension during thoracic mobilizations.    Rehab Potential Fair   Clinical Impairments Affecting Rehab Potential Positives: Age, family support, Negatives: Job requirements, chronic pain, multiple physical complaints, psychosocial factors   PT Frequency 2x / week   PT Duration 6 weeks   PT Treatment/Interventions ADLs/Self Care Home Management;Therapeutic activities;Therapeutic exercise;Manual techniques;Passive range of motion;Aquatic Therapy;Electrical Stimulation;Cryotherapy;Iontophoresis 4mg /ml Dexamethasone;Moist Heat;Traction;Ultrasound;Gait training;Neuromuscular re-education;Patient/family education  No Dry Needling per MD order    PT Next Visit Plan Pain control as needed with modalities, thoracic mobilizations, progressive strengthening program,   PT Home Exercise Plan Seated R piriformis stretch, thoracic extension and extension/rotation over foam roller, foam rolling or MF release ball/tennis ball for pec; Discontinue all rows and pec stretches, frequent icing, continue antiinflammatories as prescribed by PCP   Consulted and Agree with Plan of Care Patient      Patient will benefit from skilled therapeutic intervention in order to improve the following deficits and impairments:  Postural dysfunction, Pain, Decreased range of motion, Decreased mobility, Decreased strength, Difficulty walking, Increased muscle spasms, Impaired sensation  Visit Diagnosis: Pain in thoracic spine     Problem List Patient Active Problem List   Diagnosis Date Noted  . 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 Hypertension 04/06/2013  . Allergic rhinitis 01/18/2013  . Family history of long QT syndrome 06/12/2012  . 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 Smoker 06/12/2012  . Common migraine 05/26/2012  . Neck pain 05/26/2012  . Fibromyalgia 05/26/2012   Phillips Grout PT, DPT   Huprich,Jason 05/13/2016, 10:00 AM  [facility] [address] [place], [address] Phone: [phone]   Fax:  [phone]  Name: Raymond [name] MRN: [id number] Date of Birth: 1976-02-17

## 2016-05-14 ENCOUNTER — Encounter
Admission: RE | Disposition: A | Discharge status: Home / Self Care | Payer: Self-pay | Source: Ambulatory Visit | Attending: Gastroenterology

## 2016-05-14 ENCOUNTER — Encounter: Payer: Self-pay | Admitting: Anesthesiology

## 2016-05-14 ENCOUNTER — Ambulatory Visit: Payer: BLUE CROSS/BLUE SHIELD | Admitting: Anesthesiology

## 2016-05-14 ENCOUNTER — Encounter: Payer: BLUE CROSS/BLUE SHIELD | Admitting: Physical Therapy

## 2016-05-14 ENCOUNTER — Ambulatory Visit
Admission: RE | Admit: 2016-05-14 | Discharge: 2016-05-14 | Disposition: A | Discharge status: Home / Self Care | Payer: BLUE CROSS/BLUE SHIELD | Source: Ambulatory Visit | Attending: Gastroenterology | Admitting: Gastroenterology

## 2016-05-14 DIAGNOSIS — F909 Attention-deficit hyperactivity disorder, unspecified type: Secondary | ICD-10-CM | POA: Insufficient documentation

## 2016-05-14 DIAGNOSIS — I10 Essential (primary) hypertension: Secondary | ICD-10-CM | POA: Insufficient documentation

## 2016-05-14 DIAGNOSIS — Z87891 Personal history of nicotine dependence: Secondary | ICD-10-CM | POA: Insufficient documentation

## 2016-05-14 DIAGNOSIS — Z8719 Personal history of other diseases of the digestive system: Secondary | ICD-10-CM | POA: Insufficient documentation

## 2016-05-14 DIAGNOSIS — R1310 Dysphagia, unspecified: Secondary | ICD-10-CM | POA: Diagnosis not present

## 2016-05-14 DIAGNOSIS — K635 Polyp of colon: Secondary | ICD-10-CM | POA: Diagnosis not present

## 2016-05-14 DIAGNOSIS — K621 Rectal polyp: Secondary | ICD-10-CM | POA: Diagnosis not present

## 2016-05-14 DIAGNOSIS — M19011 Primary osteoarthritis, right shoulder: Secondary | ICD-10-CM | POA: Diagnosis not present

## 2016-05-14 DIAGNOSIS — K921 Melena: Secondary | ICD-10-CM

## 2016-05-14 DIAGNOSIS — K219 Gastro-esophageal reflux disease without esophagitis: Secondary | ICD-10-CM | POA: Diagnosis not present

## 2016-05-14 DIAGNOSIS — K648 Other hemorrhoids: Secondary | ICD-10-CM | POA: Insufficient documentation

## 2016-05-14 HISTORY — PX: ESOPHAGOGASTRODUODENOSCOPY (EGD) WITH PROPOFOL: SHX5813

## 2016-05-14 HISTORY — PX: COLONOSCOPY WITH PROPOFOL: SHX5780

## 2016-05-14 SURGERY — COLONOSCOPY WITH PROPOFOL
Anesthesia: General

## 2016-05-14 SURGERY — ESOPHAGOGASTRODUODENOSCOPY (EGD) WITH PROPOFOL
Anesthesia: General

## 2016-05-14 MED ORDER — SODIUM CHLORIDE 0.9 % IV SOLN
INTRAVENOUS | Status: DC
  Administered 2016-05-14: 1000 mL via INTRAVENOUS

## 2016-05-14 MED ORDER — PROPOFOL 500 MG/50ML IV EMUL
INTRAVENOUS | Status: AC
  Filled 2016-05-14: qty 50

## 2016-05-14 MED ORDER — PHENYLEPHRINE HCL 10 MG/ML IJ SOLN
INTRAMUSCULAR | Status: AC
  Filled 2016-05-14: qty 1

## 2016-05-14 MED ORDER — PROPOFOL 10 MG/ML IV BOLUS
INTRAVENOUS | Status: DC | PRN
  Administered 2016-05-14: 100 mg via INTRAVENOUS
  Administered 2016-05-14: 30 mg via INTRAVENOUS

## 2016-05-14 MED ORDER — PROPOFOL 500 MG/50ML IV EMUL
INTRAVENOUS | Status: DC | PRN
  Administered 2016-05-14: 120 ug/kg/min via INTRAVENOUS

## 2016-05-14 MED ORDER — PROPOFOL 10 MG/ML IV BOLUS
INTRAVENOUS | Status: AC
  Filled 2016-05-14: qty 20

## 2016-05-14 MED ORDER — GLYCOPYRROLATE 0.2 MG/ML IJ SOLN
INTRAMUSCULAR | Status: AC
  Filled 2016-05-14: qty 1

## 2016-05-14 NOTE — Anesthesia Preprocedure Evaluation (Signed)
Anesthesia Evaluation  Patient identified by MRN, date of birth, ID band Patient awake    Reviewed: Allergy & Precautions, NPO status , Patient's Chart, lab work & pertinent test results  History of Anesthesia Complications Negative for: history of anesthetic complications  Airway Mallampati: I  TM Distance: >3 FB     Dental  (+) Teeth Intact, Dental Advidsory Given, Caps, Missing   Pulmonary neg pulmonary ROS, former smoker,           Cardiovascular Exercise Tolerance: Good hypertension, (-) angina(-) CAD, (-) Past MI, (-) Cardiac Stents and (-) CABG (-) dysrhythmias (-) Valvular Problems/Murmurs     Neuro/Psych  Headaches, neg Seizures PSYCHIATRIC DISORDERS (ADHD)    GI/Hepatic Neg liver ROS, GERD  ,  Endo/Other  negative endocrine ROS  Renal/GU negative Renal ROS  negative genitourinary   Musculoskeletal   Abdominal Normal abdominal exam  (+)   Peds  Hematology negative hematology ROS (+)   Anesthesia Other Findings Past Medical History: 01/2014: Biceps tendonitis on right No date: History of gastric ulcer     Comment: summer 2015 No date: Hypertension     Comment: has not been taking his medication; advised to              start back on med. today 01/2014: Osteoarthritis of shoulder     Comment: right 01/2014: Shoulder impingement     Comment: right   Reproductive/Obstetrics negative OB ROS                             Anesthesia Physical  Anesthesia Plan  ASA: II  Anesthesia Plan: General   Post-op Pain Management:    Induction:   Airway Management Planned:   Additional Equipment:   Intra-op Plan:   Post-operative Plan:   Informed Consent: I have reviewed the patients History and Physical, chart, labs and discussed the procedure including the risks, benefits and alternatives for the proposed anesthesia with the patient or authorized representative who has indicated  his/her understanding and acceptance.   Dental Advisory Given  Plan Discussed with: Anesthesiologist, CRNA and Surgeon  Anesthesia Plan Comments:         Anesthesia Quick Evaluation

## 2016-05-14 NOTE — Op Note (Signed)
[facility] Patient Name: Raymond [name] Procedure Date: 05/14/2016 8:29 AM MRN: [id number] Account #: [id number] Date of Birth: Sep 11, 1975 Admit Type: Outpatient Age: 41 Room: [facility] ENDO ROOM 4 Gender: Male Note Status: Finalized Procedure:            Colonoscopy Indications:          Melena Providers:            Lucilla Lame MD, MD Referring MD:         Maud Deed. Copland MD, MD (Referring MD) Medicines:            Propofol per Anesthesia Complications:        No immediate complications. Procedure:            Pre-Anesthesia Assessment:                       - Prior to the procedure, a History and Physical was                        performed, and patient medications and allergies were                        reviewed. The patient's tolerance of previous                        anesthesia was also reviewed. The risks and benefits of                        the procedure and the sedation options and risks were                        discussed with the patient. All questions were                        answered, and informed consent was obtained. Prior                        Anticoagulants: The patient has taken no previous                        anticoagulant or antiplatelet agents. ASA Grade                        Assessment: II - A patient with mild systemic disease.                        After reviewing the risks and benefits, the patient was                        deemed in satisfactory condition to undergo the                        procedure.                       After obtaining informed consent, the colonoscope was                        passed under direct vision. Throughout the procedure,  the patient's blood pressure, pulse, and oxygen                        saturations were monitored continuously. The                        Colonoscope was introduced through the anus and                        advanced to the the  cecum, identified by appendiceal                        orifice and ileocecal valve. The colonoscopy was                        performed without difficulty. The patient tolerated the                        procedure well. The quality of the bowel preparation                        was excellent. Findings:      The perianal and digital rectal examinations were normal.      A 2 mm polyp was found in the rectum. The polyp was sessile. The polyp       was removed with a cold biopsy forceps. Resection and retrieval were       complete.      Non-bleeding internal hemorrhoids were found during retroflexion. The       hemorrhoids were Grade I (internal hemorrhoids that do not prolapse). Impression:           - One 2 mm polyp in the rectum, removed with a cold                        biopsy forceps. Resected and retrieved.                       - Non-bleeding internal hemorrhoids. Recommendation:       - Discharge patient to home.                       - Resume previous diet.                       - Continue present medications.                       - Await pathology results.                       - Repeat colonoscopy in 5 years if polyp adenoma and 10                        years if hyperplastic Procedure Code(s):    --- Professional ---                       [phone], Colonoscopy, flexible; with biopsy, single or                        multiple Diagnosis Code(s):    --- Professional ---  K92.1, Melena (includes Hematochezia)                       K62.1, Rectal polyp CPT copyright 2016 [employer]. All rights reserved. The codes documented in this report are preliminary and upon coder review may  be revised to meet current compliance requirements. Lucilla Lame MD, MD 05/14/2016 8:51:37 AM This report has been signed electronically. Number of Addenda: 0 Note Initiated On: 05/14/2016 8:29 AM Scope Withdrawal Time: 0 hours 6 minutes 35 seconds  Total  Procedure Duration: 0 hours 7 minutes 29 seconds       [facility]

## 2016-05-14 NOTE — Anesthesia Postprocedure Evaluation (Signed)
Anesthesia Post Note  Patient: LAVORIS SPARLING  Procedure(s) Performed: Procedure(s) (LRB): COLONOSCOPY WITH PROPOFOL (N/A) ESOPHAGOGASTRODUODENOSCOPY (EGD) WITH PROPOFOL (N/A)  Patient location during evaluation: Endoscopy Anesthesia Type: General Level of consciousness: awake and alert Pain management: pain level controlled Vital Signs Assessment: post-procedure vital signs reviewed and stable Respiratory status: spontaneous breathing, nonlabored ventilation, respiratory function stable and patient connected to nasal cannula oxygen Cardiovascular status: blood pressure returned to baseline and stable Postop Assessment: no signs of nausea or vomiting Anesthetic complications: no     Last Vitals:  Vitals:   05/14/16 0917 05/14/16 0926  BP: (!) 137/95 (!) 141/99  Pulse: (!) 55 (!) 55  Resp: 14 19  Temp:      Last Pain:  Vitals:   05/14/16 0926  TempSrc:   PainSc: 0-No pain                 Martha Clan

## 2016-05-14 NOTE — H&P (Signed)
Lucilla Lame, MD [address] [place], [place] [address] [phone] Fax : [phone]  Primary Care Physician:  [name], MD Primary Gastroenterologist:  Dr. Allen Norris  Pre-Procedure History & Physical: HPI:  Raymond [name] is a 41 y.o. male is here for an endoscopy and colonoscopy.   Past Medical History:  Diagnosis Date  . Biceps tendonitis on right 01/2014  . History of gastric ulcer    summer 2015  . Hypertension    has not been taking his medication; advised to start back on med. today  . 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 Shoulder impingement 01/2014   right    Past Surgical History:  Procedure Laterality Date  . CHOLECYSTECTOMY    . RESECTION DISTAL CLAVICAL Right 02/21/2014   Procedure: RESECTION DISTAL CLAVICAL;  Surgeon: Nita Sells, MD;  Location: [facility];  Service: Orthopedics;  Laterality: Right;  . SHOULDER ARTHROSCOPY WITH SUBACROMIAL DECOMPRESSION Right 02/21/2014   Procedure: SHOULDER ARTHROSCOPY WITH SUBACROMIAL DECOMPRESSION, DISTAL CLAVICAL EXCISION, DEBRIDIMENT OF PARTIAL ROTATOR CUFF TEAR;  Surgeon: Nita Sells, MD;  Location: [facility];  Service: Orthopedics;  Laterality: Right;  Right shoulder arthroscopy subacromial decompression, distal clavical excision    Prior to Admission medications   Medication Sig Start Date End Date Taking? Authorizing Provider  amphetamine-dextroamphetamine (ADDERALL) 15 MG tablet Take 1 tablet by mouth 2 (two) times daily with a meal. 12/13/15  Yes [name], MD  cetirizine-pseudoephedrine (ZYRTEC-D) 5-120 MG per tablet Take 1 tablet by mouth once a week.    Yes Historical Provider, MD  Cholecalciferol (VITAMIN D PO) Take 5,000 mg by mouth daily.   Yes Historical Provider, MD  cyclobenzaprine (FLEXERIL) 10 MG tablet Take 1 tablet (10 mg total) by mouth at bedtime. 05/03/16  Yes [name], MD  escitalopram (LEXAPRO) 10 MG  tablet Take 1 tablet (10 mg total) by mouth daily. 04/22/16  Yes [name], MD  Eszopiclone 3 MG TABS TAKE 1 TABLET BY MOUTH 30 MINUTES PRIOR TO BEDTIME Patient taking differently: TAKE 1 TABLET BY MOUTH 30 MINUTES PRIOR TO BEDTIME AS NEEDED FOR SLEEP 06/07/15  Yes [name], MD  fluticasone (FLONASE) 50 MCG/ACT nasal spray USE 2 SPRAYS IN EACH NOSTRIL EVERY DAY Patient taking differently: USE 2 SPRAYS IN EACH NOSTRIL EVERY DAY AS NEEDED FOR ALLERGIES 03/17/15  Yes [name], MD  hydrochlorothiazide (MICROZIDE) 12.5 MG capsule TAKE ONE CAPSULE BY MOUTH EVERY DAY 11/24/14  Yes [name], MD  lidocaine (LIDODERM) 5 % Place 1 patch onto the skin daily. Remove & Discard patch within 12 hours or as directed by MD 04/05/16  Yes Amy Cletis [place], MD  naproxen (NAPROSYN) 500 MG tablet Take 1 tablet (500 mg total) by mouth 2 (two) times daily. 03/26/16  Yes [name], MD  Na Sulfate-K Sulfate-Mg Sulf (SUPREP BOWEL PREP KIT) 17.5-3.13-1.6 GM/180ML SOLN Take 1 kit by mouth as directed. 05/10/16   Lucilla Lame, MD  omeprazole (PRILOSEC) 20 MG capsule Take 20 mg by mouth daily.    Historical Provider, MD    Allergies as of 05/01/2016 - Review Complete 04/30/2016  Allergen Reaction Noted  . Azithromycin Nausea And Vomiting 05/25/2012  . Percocet [oxycodone-acetaminophen] Hives 05/25/2012  . Vicodin [hydrocodone-acetaminophen] Hives 05/25/2012  . Adhesive [tape] Other (See Comments) 02/17/2014    Family History  Problem Relation Age of Onset  . Diabetes Mother   . Heart disease Mother   . Long QT syndrome Mother   .  Lung disease Father   . Diabetes Father     Social History   Social History  . Marital status: Married    Spouse name: N/A  . Number of children: N/A  . Years of education: N/A   Occupational History  . electrician Programme researcher, broadcasting/film/video   Social History Main Topics  . Smoking status: Former Smoker    Quit date: 09/24/2013  . Smokeless tobacco: Never Used  . Alcohol use No    . Drug use: No  . Sexual activity: Not on file   Other Topics Concern  . Not on file   Social History Narrative   Mom was in hospital with long QT syndrome, then heart in [facility].   Sister has long QT syndrome   1st cousin: long QT syndrome          Review of Systems: See HPI, otherwise negative ROS  Physical Exam: BP (!) 133/101   Pulse 77   Temp (!) 96.5 F (35.8 C) (Tympanic)   Resp 18   Ht 5' 8"  (1.727 m)   Wt 154 lb (69.9 kg)   SpO2 97%   BMI 23.42 kg/m  General:   Alert,  pleasant and cooperative in NAD Head:  Normocephalic and atraumatic. Neck:  Supple; no masses or thyromegaly. Lungs:  Clear throughout to auscultation.    Heart:  Regular rate and rhythm. Abdomen:  Soft, nontender and nondistended. Normal bowel sounds, without guarding, and without rebound.   Neurologic:  Alert and  oriented x4;  grossly normal neurologically.  Impression/Plan: Raymond [name] is here for an endoscopy and colonoscopy to be performed for GI bleeding and dysphagia  Risks, benefits, limitations, and alternatives regarding  endoscopy and colonoscopy have been reviewed with the patient.  Questions have been answered.  All parties agreeable.   Lucilla Lame, MD  05/14/2016, 7:51 AM

## 2016-05-14 NOTE — Op Note (Signed)
[facility]astroenterology Patient Name: Raymond [name] Procedure Date: 05/14/2016 8:29 AM MRN: [id number] Account #: [id number] Date of Birth: Aug 24, 1975 Admit Type: Outpatient Age: 41 Room: [facility] ENDO ROOM 4 Gender: Male Note Status: Finalized Procedure:            Upper GI endoscopy Indications:          Dysphagia Providers:            Lucilla Lame MD, MD Referring MD:         Maud Deed. Copland MD, MD (Referring MD) Medicines:            Propofol per Anesthesia Complications:        No immediate complications. Procedure:            Pre-Anesthesia Assessment:                       - Prior to the procedure, a History and Physical was                        performed, and patient medications and allergies were                        reviewed. The patient's tolerance of previous                        anesthesia was also reviewed. The risks and benefits of                        the procedure and the sedation options and risks were                        discussed with the patient. All questions were                        answered, and informed consent was obtained. Prior                        Anticoagulants: The patient has taken no previous                        anticoagulant or antiplatelet agents. ASA Grade                        Assessment: II - A patient with mild systemic disease.                        After reviewing the risks and benefits, the patient was                        deemed in satisfactory condition to undergo the                        procedure.                       After obtaining informed consent, the endoscope was                        passed under direct vision. Throughout the procedure,  the patient's blood pressure, pulse, and oxygen                        saturations were monitored continuously. The Endoscope                        was introduced through the mouth, and advanced to the   second part of duodenum. The upper GI endoscopy was                        accomplished without difficulty. The patient tolerated                        the procedure well. Findings:      The examined esophagus was normal. The scope was withdrawn. Dilation was       performed with a Maloney dilator with no resistance at 63 Fr. The       dilation site was examined following endoscope reinsertion and showed no       change.      Two biopsies were obtained with cold forceps for histology in the middle       third of the esophagus.      The stomach was normal.      The examined duodenum was normal. Impression:           - Normal esophagus. Dilated.                       - Normal stomach.                       - Normal examined duodenum.                       - Biopsy performed in the middle third of the esophagus. Recommendation:       - Discharge patient to home.                       - Resume previous diet.                       - Continue present medications.                       - Await pathology results. Procedure Code(s):    --- Professional ---                       [phone], Esophagogastroduodenoscopy, flexible, transoral;                        with biopsy, single or multiple                       43450, Dilation of esophagus, by unguided sound or                        bougie, single or multiple passes Diagnosis Code(s):    --- Professional ---                       R13.10, Dysphagia, unspecified CPT copyright 2016 American Medical Association. All rights reserved. The codes documented in this report are preliminary and upon coder review  may  be revised to meet current compliance requirements. Lucilla Lame MD, MD 05/14/2016 8:40:34 AM This report has been signed electronically. Number of Addenda: 0 Note Initiated On: 05/14/2016 8:29 AM      [facility]

## 2016-05-14 NOTE — Transfer of Care (Signed)
Immediate Anesthesia Transfer of Care Note  Patient: Raymond [name]  Procedure(s) Performed: Procedure(s): COLONOSCOPY WITH PROPOFOL (N/A) ESOPHAGOGASTRODUODENOSCOPY (EGD) WITH PROPOFOL (N/A)  Patient Location: PACU and Endoscopy Unit  Anesthesia Type:General  Level of Consciousness: awake, alert  and oriented  Airway & Oxygen Therapy: Patient Spontanous Breathing and Patient connected to nasal cannula oxygen  Post-op Assessment: Report given to RN and Post -op Vital signs reviewed and stable  Post vital signs: Reviewed and stable  Last Vitals:  Vitals:   05/14/16 0739 05/14/16 0857  BP: (!) 133/101 100/78  Pulse: 77 68  Resp: 18 16  Temp: (!) 35.8 C     Last Pain:  Vitals:   05/14/16 0739  TempSrc: Tympanic         Complications: No anesthetic complications

## 2016-05-14 NOTE — Anesthesia Post-op Follow-up Note (Cosign Needed)
Anesthesia QCDR form completed.        

## 2016-05-15 LAB — SURGICAL PATHOLOGY

## 2016-05-16 ENCOUNTER — Encounter: Payer: Self-pay | Admitting: Gastroenterology

## 2016-05-17 ENCOUNTER — Ambulatory Visit (INDEPENDENT_AMBULATORY_CARE_PROVIDER_SITE_OTHER): Payer: BLUE CROSS/BLUE SHIELD

## 2016-05-17 ENCOUNTER — Other Ambulatory Visit: Payer: Self-pay

## 2016-05-17 DIAGNOSIS — R0602 Shortness of breath: Secondary | ICD-10-CM

## 2016-05-17 DIAGNOSIS — R079 Chest pain, unspecified: Secondary | ICD-10-CM

## 2016-05-17 LAB — ECHOCARDIOGRAM STRESS TEST
Estimated workload: 13 METS
Exercise duration (min): 10 min
Exercise duration (sec): 47 s
MPHR: 182 {beats}/min
Peak HR: 171 {beats}/min
Percent HR: 93 %
Rest HR: 62 {beats}/min

## 2016-05-21 ENCOUNTER — Encounter: Payer: Self-pay | Admitting: Physical Therapy

## 2016-05-21 ENCOUNTER — Ambulatory Visit: Payer: BLUE CROSS/BLUE SHIELD | Attending: Orthopedic Surgery | Admitting: Physical Therapy

## 2016-05-21 DIAGNOSIS — M546 Pain in thoracic spine: Secondary | ICD-10-CM | POA: Diagnosis not present

## 2016-05-21 DIAGNOSIS — M5441 Lumbago with sciatica, right side: Secondary | ICD-10-CM | POA: Insufficient documentation

## 2016-05-21 DIAGNOSIS — G8929 Other chronic pain: Secondary | ICD-10-CM | POA: Diagnosis not present

## 2016-05-21 DIAGNOSIS — M25511 Pain in right shoulder: Secondary | ICD-10-CM | POA: Insufficient documentation

## 2016-05-21 DIAGNOSIS — M25512 Pain in left shoulder: Secondary | ICD-10-CM | POA: Diagnosis not present

## 2016-05-21 DIAGNOSIS — J019 Acute sinusitis, unspecified: Secondary | ICD-10-CM | POA: Diagnosis not present

## 2016-05-21 NOTE — Therapy (Signed)
[facility] [address], [place], [address] Phone: [phone]   Fax:  [phone]  Physical Therapy Treatment  Patient Details  Name: Raymond [name] MRN: [id number] Date of Birth: October 20, 1975 Referring Provider: Karenann Cai PA-C  Encounter Date: 05/21/2016      PT End of Session - 05/21/16 1511    Visit Number 6   Number of Visits 13   Date for PT Re-Evaluation 06/11/16   PT Start Time 5427   PT Stop Time 1555   PT Time Calculation (min) 43 min   Activity Tolerance Patient tolerated treatment well   Behavior During Therapy [facility] for tasks assessed/performed      Past Medical History:  Diagnosis Date  . Biceps tendonitis on right 01/2014  . History of gastric ulcer    summer 2015  . Hypertension    has not been taking his medication; advised to start back on med. today  . 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 Shoulder impingement 01/2014   right    Past Surgical History:  Procedure Laterality Date  . CHOLECYSTECTOMY    . COLONOSCOPY WITH PROPOFOL N/A 05/14/2016   Procedure: COLONOSCOPY WITH PROPOFOL;  Surgeon: Lucilla Lame, MD;  Location: [facility] ENDOSCOPY;  Service: Endoscopy;  Laterality: N/A;  . ESOPHAGOGASTRODUODENOSCOPY (EGD) WITH PROPOFOL N/A 05/14/2016   Procedure: ESOPHAGOGASTRODUODENOSCOPY (EGD) WITH PROPOFOL;  Surgeon: Lucilla Lame, MD;  Location: [facility] ENDOSCOPY;  Service: Endoscopy;  Laterality: N/A;  . RESECTION DISTAL CLAVICAL Right 02/21/2014   Procedure: RESECTION DISTAL CLAVICAL;  Surgeon: Nita Sells, MD;  Location: [facility];  Service: Orthopedics;  Laterality: Right;  . SHOULDER ARTHROSCOPY WITH SUBACROMIAL DECOMPRESSION Right 02/21/2014   Procedure: SHOULDER ARTHROSCOPY WITH SUBACROMIAL DECOMPRESSION, DISTAL CLAVICAL EXCISION, DEBRIDIMENT OF PARTIAL ROTATOR CUFF TEAR;  Surgeon: Nita Sells, MD;  Location: [facility];   Service: Orthopedics;  Laterality: Right;  Right shoulder arthroscopy subacromial decompression, distal clavical excision    There were no vitals filed for this visit.      Subjective Assessment - 05/21/16 1516    Subjective Pt reports no new changes since last session.  Has been completing his HEP every day without questions or concerns.  Pt reports he has pain in his pecs and anterior chest with repetitive overhead reaching at work (no significant weight, up to 1 lb).   Pertinent History Pt reports that he was lifting a bunch of things around the house on February 5th and he noticed that he started having low back pain the following day. He does not recall a particular mechanism of injury. He describes the pain as "nerve" pain as well as "achy, sharp, and dull." "It feels like I'm getting shocked." Pt reports pain occurs sometimes unilaterally in low back, sometimes alternates between sides, and sometimes concurrently bilaterally. Pain radiates around the front of his abdomen (not through), to his shoulders, and down his arms. Pain occasionally radiates down both legs but pain primarily radiates down the RLE. He reports no change in pain as the day progresses. Pain occasionally wakes him up at night. Previously he was sleeping all night. Worst pain: 10/10, Best: 4/10, Present: 6/10. Pt initially denies history of any similar pain however at other times reports that he had comparable low back pain when he was first diagnosed with fibromyalgia in 2014. Pt reports loss of intermittent loss bladder control since his back pain started but not loss of bowel control. He  reports that he disclosed this information to his physician. He also complains of intermittent numbness in the R groin area, which he feels run down his right leg. Denies chills, fevers, or night sweats. Confirms weight loss of 22# since symptoms first began in [place]. Pt reports a prior history of bilateral shoulder and neck pain which feels  better when his "vertebra in upper back slips back in." He reports that chiropractic manipulation improves his shoulder pain, chest pain, and difficulty with swallowing. History of R SAD January 2016. Per patient report he had MRI of both cervical and thoracic spine without any findings. Since 03/11/16 he has had 4 ER visits, 4 PCP visits,  1 cardiology visit, 1 visit to spine specialist, and 1 GI visit. He saw GI due to dysphagia and per note plan is for upper endoscopy.    Limitations Walking   Diagnostic tests MRI of neck and thoracic spine: WNL   Patient Stated Goals Be able to work pain free and return to martial arts   Currently in Pain? Yes   Pain Score 2    Pain Location Back   Pain Orientation Upper   Pain Descriptors / Indicators Aching   Pain Type Chronic pain   Pain Onset More than a month ago   Multiple Pain Sites No   Pain Onset --      TREATMENT   Therapeutic Exercise:  TA contractions in hooklying with verbal and tactile cues for technique. 5 second holds 2x10. Cues not to hold breath.   Bridges 2x10 with cues for glute and core activation, pt denies pain with this   Seated R piriformis stretch 1x30 sec each LE   Overhead reaching with cone to top shelf of bookshelf with cues to look up where he is placing the cone and to face bookshelf directly. Painfree without weight. Repeated x10 with 1lb weight. Pt reports minimal pain with this (2-3/10) compared to 5-6/10 at work.    Manual Therapy:  Gentle STM to pec major and minor bilaterally x2 minutes in supine   CPA performed T4-T7, grade III-IV, 30s/bout, 2 bouts/level, no cavitations noted on this date   Gentle STM performed along mid trap and rhomboids bilaterally             PT Education - 05/21/16 1510    Education provided Yes   Education Details Exercise techinque; clinical reasoning behind interventions   Person(s) Educated Patient   Methods Explanation;Demonstration;Verbal cues   Comprehension  Verbalized understanding;Returned demonstration;Need further instruction             PT Long Term Goals - 05/01/16 1007      PT LONG TERM GOAL #1   Title Pt will be independent with HEP in order to decrease pain and improve strength in order to improve pain-free function at home and work.   Baseline --   Time 6   Period Weeks   Status New     PT LONG TERM GOAL #2   Title Pt will decrease mODI scoreby at least 13 points in order demonstrate clinically significant reduction in pain/disability    Baseline 04/30/16: 76%   Time 6   Period Weeks   Status New     PT LONG TERM GOAL #3   Title Pt will decrease worst low back pain to 8/10 or below with aggravating activities in order to improve pain-free function at home and work   Baseline 04/30/16: worst: 10/10   Time 6   Period Weeks  Status New               Plan - 05/21/16 1555    Clinical Impression Statement Pt reports that overall he has noticed improvement in his symptoms since starting therapy.  He performed TA activations in hooklying for improved trunk control and support with cues for breathing throughout.  Modified posture with overhead reaching and instructed pt to trial this at work and report back.  He responded well to manual therapy.  He will benefit from continued skilled PT interventions for improved posture, body mechanics with work activities and to improve QOL.   Rehab Potential Fair   Clinical Impairments Affecting Rehab Potential Positives: Age, family support, Negatives: Job requirements, chronic pain, multiple physical complaints, psychosocial factors   PT Frequency 2x / week   PT Duration 6 weeks   PT Treatment/Interventions ADLs/Self Care Home Management;Therapeutic activities;Therapeutic exercise;Manual techniques;Passive range of motion;Aquatic Therapy;Electrical Stimulation;Cryotherapy;Iontophoresis 4mg /ml Dexamethasone;Moist Heat;Traction;Ultrasound;Gait training;Neuromuscular  re-education;Patient/family education  No Dry Needling per MD order   PT Next Visit Plan Pain control as needed with modalities, thoracic mobilizations, progressive strengthening program,   PT Home Exercise Plan Seated R piriformis stretch, thoracic extension and extension/rotation over foam roller, foam rolling or MF release ball/tennis ball for pec; Discontinue all rows and pec stretches, frequent icing, continue antiinflammatories as prescribed by PCP   Consulted and Agree with Plan of Care Patient      Patient will benefit from skilled therapeutic intervention in order to improve the following deficits and impairments:  Postural dysfunction, Pain, Decreased range of motion, Decreased mobility, Decreased strength, Difficulty walking, Increased muscle spasms, Impaired sensation  Visit Diagnosis: Pain in thoracic spine     Problem List Patient Active Problem List   Diagnosis Date Noted  . Melena   . Rectal polyp   . Problems with swallowing and mastication   . 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 Hypertension 04/06/2013  . Allergic rhinitis 01/18/2013  . Family history of long QT syndrome 06/12/2012  . 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 Smoker 06/12/2012  . Common migraine 05/26/2012  . Neck pain 05/26/2012  . Fibromyalgia 05/26/2012    Collie Siad PT, DPT 05/21/2016, 3:58 PM  [facility] [address] [place], [address] Phone: [phone]   Fax:  [phone]  Name: Raymond [name] MRN: [id number] Date of Birth: 12/07/1975

## 2016-05-22 ENCOUNTER — Ambulatory Visit: Payer: Self-pay | Admitting: Gastroenterology

## 2016-05-22 ENCOUNTER — Telehealth: Payer: Self-pay

## 2016-05-22 NOTE — Telephone Encounter (Signed)
-----   Message from Lucilla Lame, MD sent at 05/20/2016 12:58 PM EDT ----- Let the patient know that the biopsies of his esophagus were normal without any sign of inflammation. The colon biopsy was benign without any sign of inflammation or precancerous lesions.

## 2016-05-22 NOTE — Telephone Encounter (Signed)
Pt's wife, Kenney Houseman notified of procedure results.

## 2016-05-27 ENCOUNTER — Other Ambulatory Visit: Payer: Self-pay | Admitting: *Deleted

## 2016-05-27 MED ORDER — ESCITALOPRAM OXALATE 10 MG PO TABS: 10.0000 mg | ORAL_TABLET | Freq: Every day | ORAL | 0 refills | Status: DC

## 2016-05-28 ENCOUNTER — Ambulatory Visit: Payer: BLUE CROSS/BLUE SHIELD

## 2016-05-28 DIAGNOSIS — M5441 Lumbago with sciatica, right side: Secondary | ICD-10-CM

## 2016-05-28 DIAGNOSIS — G8929 Other chronic pain: Secondary | ICD-10-CM | POA: Diagnosis not present

## 2016-05-28 DIAGNOSIS — M546 Pain in thoracic spine: Secondary | ICD-10-CM

## 2016-05-28 DIAGNOSIS — M25511 Pain in right shoulder: Secondary | ICD-10-CM | POA: Diagnosis not present

## 2016-05-28 DIAGNOSIS — M25512 Pain in left shoulder: Secondary | ICD-10-CM | POA: Diagnosis not present

## 2016-05-28 NOTE — Therapy (Signed)
[facility] [address] [place], [address] Phone: [phone]   Fax:  [phone]  Physical Therapy Treatment  Patient Details  Name: Raymond [name] MRN: [id number] Date of Birth: November 11, 1975 Referring Provider: Karenann Cai PA-C  Encounter Date: 05/28/2016      PT End of Session - 05/28/16 1357    Visit Number 7   Number of Visits 13   Date for PT Re-Evaluation 06/11/16   PT Start Time 1350   PT Stop Time 1430   PT Time Calculation (min) 40 min   Activity Tolerance Patient tolerated treatment well   Behavior During Therapy [facility] for tasks assessed/performed      Past Medical History:  Diagnosis Date  . Biceps tendonitis on right 01/2014  . History of gastric ulcer    summer 2015  . Hypertension    has not been taking his medication; advised to start back on med. today  . 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 Shoulder impingement 01/2014   right    Past Surgical History:  Procedure Laterality Date  . CHOLECYSTECTOMY    . COLONOSCOPY WITH PROPOFOL N/A 05/14/2016   Procedure: COLONOSCOPY WITH PROPOFOL;  Surgeon: Lucilla Lame, MD;  Location: [facility];  Service: Endoscopy;  Laterality: N/A;  . ESOPHAGOGASTRODUODENOSCOPY (EGD) WITH PROPOFOL N/A 05/14/2016   Procedure: ESOPHAGOGASTRODUODENOSCOPY (EGD) WITH PROPOFOL;  Surgeon: Lucilla Lame, MD;  Location: [facility]OS[facility];  Service: Endoscopy;  Laterality: N/A;  . RESECTION DISTAL CLAVICAL Right 02/21/2014   Procedure: RESECTION DISTAL CLAVICAL;  Surgeon: Nita Sells, MD;  Location: [place];  Service: Orthopedics;  Laterality: Right;  . SHOULDER ARTHROSCOPY WITH SUBACROMIAL DECOMPRESSION Right 02/21/2014   Procedure: SHOULDER ARTHROSCOPY WITH SUBACROMIAL DECOMPRESSION, DISTAL CLAVICAL EXCISION, DEBRIDIMENT OF PARTIAL ROTATOR CUFF TEAR;  Surgeon: Nita Sells, MD;  Location: [place];   Service: Orthopedics;  Laterality: Right;  Right shoulder arthroscopy subacromial decompression, distal clavical excision    There were no vitals filed for this visit.      Subjective Assessment - 05/28/16 1354    Subjective Pt reports that his upper back is approximately 70% improved since starting therapy. He reports that using his foam roller and performing his exercises at home has significantly improved his upper back pain. However he states that he is having increased low back pain on this date. Pain starts in right lower back and runs all the way down RLE to his ankle. He reports no improvement in his chest pain since last therapy session. No specific question at time of arrival.    Pertinent History Pt reports that he was lifting a bunch of things around the house on February 5th and he noticed that he started having low back pain the following day. He does not recall a particular mechanism of injury. He describes the pain as "nerve" pain as well as "achy, sharp, and dull." "It feels like I'm getting shocked." Pt reports pain occurs sometimes unilaterally in low back, sometimes alternates between sides, and sometimes concurrently bilaterally. Pain radiates around the front of his abdomen (not through), to his shoulders, and down his arms. Pain occasionally radiates down both legs but pain primarily radiates down the RLE. He reports no change in pain as the day progresses. Pain occasionally wakes him up at night. Previously he was sleeping all night. Worst pain: 10/10, Best: 4/10, Present: 6/10. Pt initially denies history of any similar pain however at other  times reports that he had comparable low back pain when he was first diagnosed with fibromyalgia in 2014. Pt reports loss of intermittent loss bladder control since his back pain started but not loss of bowel control. He reports that he disclosed this information to his physician. He also complains of intermittent numbness in the R groin area,  which he feels run down his right leg. Denies chills, fevers, or night sweats. Confirms weight loss of 22# since symptoms first began in [place]. Pt reports a prior history of bilateral shoulder and neck pain which feels better when his "vertebra in upper back slips back in." He reports that chiropractic manipulation improves his shoulder pain, chest pain, and difficulty with swallowing. History of R SAD January 2016. Per patient report he had MRI of both cervical and thoracic spine without any findings. Since 03/11/16 he has had 4 ER visits, 4 PCP visits,  1 cardiology visit, 1 visit to spine specialist, and 1 GI visit. He saw GI due to dysphagia and per note plan is for upper endoscopy.    Limitations Walking   Diagnostic tests MRI of neck and thoracic spine: WNL   Patient Stated Goals Be able to work pain free and return to martial arts   Currently in Pain? Yes   Pain Score 0-No pain   Pain Location Back   Pain Orientation Upper   Multiple Pain Sites Yes   Pain Score 4   Pain Location Back   Pain Orientation Right;Left  Worst on R side   Pain Descriptors / Indicators Aching;Dull;Burning;Sharp   Pain Type Chronic pain   Pain Onset More than a month ago   Pain Frequency Intermittent   Pain Score 6   Pain Location Chest   Pain Orientation Right;Left  sternal   Pain Descriptors / Indicators Aching;Sharp   Pain Type Chronic pain   Pain Onset More than a month ago   Pain Frequency Intermittent           TREATMENT   Estim Hi[employer]olt electrical stimulation to patient tolerated intensity (265V one side, 235V other side) along bilateral lumbar paraspinals, goal to decrease spasm and reduce pain. Moist heat pack applied to lumbar spine concurrently with electrical stimulation. Mild superficial reddening from heat noted but no adverse reaction to electrodes or stimulation;   Manual Therapy Gentle STM to pec major and minor bilaterally with instrument assist, fanning and sweeping strokes  utilized with instrument assist; R knee to chest stretch 30s hold x 3; R hip IR stretch 30s hold x 3; R hip ER stretch 30s hold x 3; R hip distraction mobilization, grade III, 30s/bout x 3 bouts; Prone on elbows followed by press-up with overpressure x 10, no change in symptoms reported; Pt encouraged to get into prone on elbows position multiple times a day for 5 minutes at a time (added to HEP)                            PT Education - 05/28/16 1356    Education provided Yes   Education Details Plan of care, prone positioning on elbows   Person(s) Educated Patient   Methods Explanation   Comprehension Verbalized understanding             PT Long Term Goals - 05/01/16 1007      PT LONG TERM GOAL #1   Title Pt will be independent with HEP in order to decrease pain and improve strength  in order to improve pain-free function at home and work.   Baseline --   Time 6   Period Weeks   Status New     PT LONG TERM GOAL #2   Title Pt will decrease mODI scoreby at least 13 points in order demonstrate clinically significant reduction in pain/disability    Baseline 04/30/16: 76%   Time 6   Period Weeks   Status New     PT LONG TERM GOAL #3   Title Pt will decrease worst low back pain to 8/10 or below with aggravating activities in order to improve pain-free function at home and work   Baseline 04/30/16: worst: 10/10   Time 6   Period Weeks   Status New               Plan - 05/28/16 1357    Clinical Impression Statement Pt reports approximately 70% improvement in upper back pain since starting therapy. He reports persistent pain in bilateral chest near sternum as well as radiating laterally and superiorly toward his shoulders. Pt also complains of increased low back pain over the last week. He responds well to therapy on this date with resolution of his low back pain. Pt reports position of relief is on his stomach. Pt encouraged to get into prone on  elbows position frequently throughout the day. No real improvement noted today during prone press-ups. He reports improvement in his anxiety and states that when his chest hurts he doesn't catastrophize his symptoms. Pt advised to continue additional HEP and follow-up as scheduled.    Rehab Potential Fair   Clinical Impairments Affecting Rehab Potential Positives: Age, family support, Negatives: Job requirements, chronic pain, multiple physical complaints, psychosocial factors   PT Frequency 2x / week   PT Duration 6 weeks   PT Treatment/Interventions ADLs/Self Care Home Management;Therapeutic activities;Therapeutic exercise;Manual techniques;Passive range of motion;Aquatic Therapy;Electrical Stimulation;Cryotherapy;Iontophoresis 4mg /ml Dexamethasone;Moist Heat;Traction;Ultrasound;Gait training;Neuromuscular re-education;Patient/family education  No Dry Needling per MD order   PT Next Visit Plan Pain control as needed with modalities, thoracic mobilizations if needed, progressive strengthening program once pt can tolerate.   PT Home Exercise Plan Seated R piriformis stretch, thoracic extension and extension/rotation over foam roller, foam rolling or MF release ball/tennis ball for pec, prone on elbows multiple times/day x 5 minute at a time; Discontinue all rows and pec stretches, perform frequent icing, continue antiinflammatories as prescribed by PCP   Consulted and Agree with Plan of Care Patient      Patient will benefit from skilled therapeutic intervention in order to improve the following deficits and impairments:  Postural dysfunction, Pain, Decreased range of motion, Decreased mobility, Decreased strength, Difficulty walking, Increased muscle spasms, Impaired sensation  Visit Diagnosis: Pain in thoracic spine  Acute bilateral low back pain with right-sided sciatica     Problem List Patient Active Problem List   Diagnosis Date Noted  . Melena   . Rectal polyp   . Problems with  swallowing and mastication   . 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 Hypertension 04/06/2013  . Allergic rhinitis 01/18/2013  . Family history of long QT syndrome 06/12/2012  . 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 Smoker 06/12/2012  . Common migraine 05/26/2012  . Neck pain 05/26/2012  . Fibromyalgia 05/26/2012   Phillips Grout PT, DPT   Genell Thede 05/28/2016, 3:14 PM  [facility] [address] [place], [address] Phone: [phone]   Fax:  [phone]  Name: Raymond [name] MRN: [id number] Date of Birth: [birth year]

## 2016-05-30 ENCOUNTER — Ambulatory Visit: Payer: BLUE CROSS/BLUE SHIELD

## 2016-05-30 ENCOUNTER — Ambulatory Visit (INDEPENDENT_AMBULATORY_CARE_PROVIDER_SITE_OTHER): Payer: BLUE CROSS/BLUE SHIELD | Admitting: Family Medicine

## 2016-05-30 ENCOUNTER — Encounter: Payer: Self-pay | Admitting: Family Medicine

## 2016-05-30 VITALS — BP 116/64 | HR 98 | Temp 98.2°F | Ht 68.0 in | Wt 162.2 lb

## 2016-05-30 DIAGNOSIS — M5441 Lumbago with sciatica, right side: Secondary | ICD-10-CM | POA: Diagnosis not present

## 2016-05-30 DIAGNOSIS — M25512 Pain in left shoulder: Secondary | ICD-10-CM | POA: Diagnosis not present

## 2016-05-30 DIAGNOSIS — M25511 Pain in right shoulder: Secondary | ICD-10-CM | POA: Diagnosis not present

## 2016-05-30 DIAGNOSIS — F411 Generalized anxiety disorder: Secondary | ICD-10-CM | POA: Diagnosis not present

## 2016-05-30 DIAGNOSIS — F321 Major depressive disorder, single episode, moderate: Secondary | ICD-10-CM | POA: Diagnosis not present

## 2016-05-30 DIAGNOSIS — M546 Pain in thoracic spine: Secondary | ICD-10-CM

## 2016-05-30 DIAGNOSIS — G8929 Other chronic pain: Secondary | ICD-10-CM | POA: Diagnosis not present

## 2016-05-30 MED ORDER — FLUTICASONE PROPIONATE 50 MCG/ACT NA SUSP: 2.0000 | Freq: Every day | NASAL | 5 refills | Status: DC

## 2016-05-30 MED ORDER — TRAZODONE HCL 100 MG PO TABS: 100.0000 mg | ORAL_TABLET | Freq: Every evening | ORAL | 5 refills | Status: DC | PRN

## 2016-05-30 NOTE — Progress Notes (Signed)
Dr. Frederico Hamman T. Edwyn Inclan, MD, [facility] [place], [address] Phone: [phone] Fax: [phone]  05/30/2016  Patient: Raymond [name], MRN: [id number], DOB: [birth year], 41 y.o.  Primary Physician:  Raymond Loffler, MD   Chief Complaint  Patient presents with  . Follow-up   Subjective:   Raymond [name] is a 41 y.o. very pleasant male patient who presents with the following:  f/u multiple problem, dep / anxiety:  Raymond [name] is here following up today, primarily regarding his anxiety and depression.  At last office visit with discussion with him, his mother, and his wife, we decided that he likely was having some depression and anxiety which was contributing to his physical symptoms as well.  His chest pain is much improved.  His neck and back pain is improving.  He thinks to the physical therapies helped quite a bit.  Past Medical History, Surgical History, Social History, Family History, Problem List, Medications, and Allergies have been reviewed and updated if relevant.  Patient Active Problem List   Diagnosis Date Noted  . Melena   . Rectal polyp   . Problems with swallowing and mastication   . 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 Hypertension 04/06/2013  . Allergic rhinitis 01/18/2013  . Family history of long QT syndrome 06/12/2012  . 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 Smoker 06/12/2012  . Common migraine 05/26/2012  . Neck pain 05/26/2012  . Fibromyalgia 05/26/2012    Past Medical History:  Diagnosis Date  . Biceps tendonitis on right 01/2014  . History of gastric ulcer    summer 2015  . Hypertension    has not been taking his medication; advised to start back on med. today  . 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 Shoulder impingement 01/2014   right    Past Surgical History:  Procedure Laterality Date  . CHOLECYSTECTOMY    .  COLONOSCOPY WITH PROPOFOL N/A 05/14/2016   Procedure: COLONOSCOPY WITH PROPOFOL;  Surgeon: Raymond Lame, MD;  Location: [facility];  Service: Endoscopy;  Laterality: N/A;  . ESOPHAGOGASTRODUODENOSCOPY (EGD) WITH PROPOFOL N/A 05/14/2016   Procedure: ESOPHAGOGASTRODUODENOSCOPY (EGD) WITH PROPOFOL;  Surgeon: Raymond Lame, MD;  Location: [facility];  Service: Endoscopy;  Laterality: N/A;  . RESECTION DISTAL CLAVICAL Right 02/21/2014   Procedure: RESECTION DISTAL CLAVICAL;  Surgeon: Raymond Sells, MD;  Location: [place];  Service: Orthopedics;  Laterality: Right;  . SHOULDER ARTHROSCOPY WITH SUBACROMIAL DECOMPRESSION Right 02/21/2014   Procedure: SHOULDER ARTHROSCOPY WITH SUBACROMIAL DECOMPRESSION, DISTAL CLAVICAL EXCISION, DEBRIDIMENT OF PARTIAL ROTATOR CUFF TEAR;  Surgeon: Raymond Sells, MD;  Location: [place];  Service: Orthopedics;  Laterality: Right;  Right shoulder arthroscopy subacromial decompression, distal clavical excision    Social History   Social History  . Marital status: Married    Spouse name: N/A  . Number of children: N/A  . Years of education: N/A   Occupational History  . electrician Programme researcher, broadcasting/film/video   Social History Main Topics  . Smoking status: Former Smoker    Quit date: 09/24/2013  . Smokeless tobacco: Never Used  . Alcohol use No  . Drug use: No  . Sexual activity: Not on file   Other Topics Concern  . Not on file   Social History Narrative   Mom was in hospital with long QT syndrome, then heart in A Fib.   Sister has long QT syndrome  1st cousin: long QT syndrome          Family History  Problem Relation Age of Onset  . Diabetes Mother   . Heart disease Mother   . Long QT syndrome Mother   . Lung disease Father   . Diabetes Father     Allergies  Allergen Reactions  . Azithromycin Nausea And Vomiting  . Percocet [Oxycodone-Acetaminophen] Hives  . Vicodin [Hydrocodone-Acetaminophen] Hives  .  Adhesive [Tape] Other (See Comments)    SKIN IRRITATION    Medication list reviewed and updated in full in [place].   GEN: No acute illnesses, no fevers, chills. GI: No n/v/d, eating normally Pulm: No SOB Interactive and getting along well at home.  Otherwise, ROS is as per the HPI.  Objective:   BP 116/64   Pulse 98   Temp 98.2 F (36.8 C) (Oral)   Ht 5' 8"  (1.727 m)   Wt 162 lb 4 oz (73.6 kg)   BMI 24.67 kg/m   GEN: WDWN, NAD, Non-toxic, A & O x 3 HEENT: Atraumatic, Normocephalic. Neck supple. No masses, No LAD. Ears and Nose: No external deformity. CV: RRR, No M/G/R. No JVD. No thrill. No extra heart sounds. PULM: CTA B, no wheezes, crackles, rhonchi. No retractions. No resp. distress. No accessory muscle use. EXTR: No c/c/e NEURO Normal gait.  PSYCH: Normally interactive. Conversant. Not depressed or anxious appearing.  Calm demeanor.   Laboratory and Imaging Data:  Assessment and Plan:   Depression, major, single episode, moderate (HCC)  Generalized anxiety disorder  Seems to be doing much better.  Decreased depression, decreased anxiety, decreased pain.  Is still having intermittent insomnia, so we will stop his Lunesta and have him try some trazodone which she has not done before.  Follow-up: CPX in the fall  Meds ordered this encounter  Medications  . doxycycline (VIBRA-TABS) 100 MG tablet    Sig: Take by mouth.  . predniSONE (DELTASONE) 5 MG tablet    Sig: 6 DAYS TAPER . TAKE AS DIRECTED WITH FOOD  . ketoprofen (ORUDIS) 75 MG capsule    Sig: Take 75 mg by mouth 3 (three) times daily.    Refill:  2  . traZODone (DESYREL) 100 MG tablet    Sig: Take 1 tablet (100 mg total) by mouth at bedtime as needed for sleep.    Dispense:  30 tablet    Refill:  5  . fluticasone (FLONASE) 50 MCG/ACT nasal spray    Sig: Place 2 sprays into both nostrils daily.    Dispense:  16 g    Refill:  5   Medications Discontinued During This Encounter    Medication Reason  . Na Sulfate-K Sulfate-Mg Sulf (SUPREP BOWEL PREP KIT) 17.5-3.13-1.6 GM/180ML SOLN Completed Course  . Eszopiclone 3 MG TABS   . fluticasone (FLONASE) 50 MCG/ACT nasal spray Reorder   No orders of the defined types were placed in this encounter.   Signed,  Raymond Deed. Cliffard Hair, MD   Allergies as of 05/30/2016      Reactions   Azithromycin Nausea And Vomiting   Percocet [oxycodone-acetaminophen] Hives   Vicodin [hydrocodone-acetaminophen] Hives   Adhesive [tape] Other (See Comments)   SKIN IRRITATION      Medication List       Accurate as of 05/30/16 11:59 PM. Always use your most recent med list.          amphetamine-dextroamphetamine 15 MG tablet Commonly known as:  ADDERALL Take 1 tablet by mouth  2 (two) times daily with a meal.   cetirizine-pseudoephedrine 5-120 MG tablet Commonly known as:  ZYRTEC-D Take 1 tablet by mouth once a week.   cyclobenzaprine 10 MG tablet Commonly known as:  FLEXERIL Take 1 tablet (10 mg total) by mouth at bedtime.   doxycycline 100 MG tablet Commonly known as:  VIBRA-TABS Take by mouth.   escitalopram 10 MG tablet Commonly known as:  LEXAPRO Take 1 tablet (10 mg total) by mouth daily.   fluticasone 50 MCG/ACT nasal spray Commonly known as:  FLONASE Place 2 sprays into both nostrils daily.   hydrochlorothiazide 12.5 MG capsule Commonly known as:  MICROZIDE TAKE ONE CAPSULE BY MOUTH EVERY DAY   ketoprofen 75 MG capsule Commonly known as:  ORUDIS Take 75 mg by mouth 3 (three) times daily.   lidocaine 5 % Commonly known as:  LIDODERM Place 1 patch onto the skin daily. Remove & Discard patch within 12 hours or as directed by MD   naproxen 500 MG tablet Commonly known as:  NAPROSYN Take 1 tablet (500 mg total) by mouth 2 (two) times daily.   omeprazole 20 MG capsule Commonly known as:  PRILOSEC Take 20 mg by mouth daily.   predniSONE 5 MG tablet Commonly known as:  DELTASONE 6 DAYS TAPER . TAKE AS  DIRECTED WITH FOOD   traZODone 100 MG tablet Commonly known as:  DESYREL Take 1 tablet (100 mg total) by mouth at bedtime as needed for sleep.   VITAMIN D PO Take 5,000 mg by mouth daily.

## 2016-05-30 NOTE — Therapy (Signed)
[facility] [address], [place], [address] Phone: [phone]   Fax:  [phone]  Physical Therapy Treatment  Patient Details  Name: Raymond [name] MRN: [id number] Date of Birth: [birth year] Referring Provider: Karenann Cai PA-C  Encounter Date: 05/30/2016      PT End of Session - 06/02/16 1357    Visit Number 8   Number of Visits 13   Date for PT Re-Evaluation 06/11/16   PT Start Time 1500   PT Stop Time 1545   PT Time Calculation (min) 45 min   Activity Tolerance Patient tolerated treatment well   Behavior During Therapy [facility] for tasks assessed/performed      Past Medical History:  Diagnosis Date  . Biceps tendonitis on right 01/2014  . History of gastric ulcer    summer 2015  . Hypertension    has not been taking his medication; advised to start back on med. today  . 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 Shoulder impingement 01/2014   right    Past Surgical History:  Procedure Laterality Date  . CHOLECYSTECTOMY    . COLONOSCOPY WITH PROPOFOL N/A 05/14/2016   Procedure: COLONOSCOPY WITH PROPOFOL;  Surgeon: Lucilla Lame, MD;  Location: [facility];  Service: Endoscopy;  Laterality: N/A;  . ESOPHAGOGASTRODUODENOSCOPY (EGD) WITH PROPOFOL N/A 05/14/2016   Procedure: ESOPHAGOGASTRODUODENOSCOPY (EGD) WITH PROPOFOL;  Surgeon: Lucilla Lame, MD;  Location: [facility];  Service: Endoscopy;  Laterality: N/A;  . RESECTION DISTAL CLAVICAL Right 02/21/2014   Procedure: RESECTION DISTAL CLAVICAL;  Surgeon: Nita Sells, MD;  Location: [place];  Service: Orthopedics;  Laterality: Right;  . SHOULDER ARTHROSCOPY WITH SUBACROMIAL DECOMPRESSION Right 02/21/2014   Procedure: SHOULDER ARTHROSCOPY WITH SUBACROMIAL DECOMPRESSION, DISTAL CLAVICAL EXCISION, DEBRIDIMENT OF PARTIAL ROTATOR CUFF TEAR;  Surgeon: Nita Sells, MD;  Location: [place];   Service: Orthopedics;  Laterality: Right;  Right shoulder arthroscopy subacromial decompression, distal clavical excision    There were no vitals filed for this visit.      Subjective Assessment - 06/02/16 1356    Subjective Pt reports continued improvement in his upper back pain. His low back is much improved today as well. He continues to report chest pain which does not appear to be responding much to physical therapy. He does report improvement in chest pain with icing and activity modification.    Pertinent History Pt reports that he was lifting a bunch of things around the house on February 5th and he noticed that he started having low back pain the following day. He does not recall a particular mechanism of injury. He describes the pain as "nerve" pain as well as "achy, sharp, and dull." "It feels like I'm getting shocked." Pt reports pain occurs sometimes unilaterally in low back, sometimes alternates between sides, and sometimes concurrently bilaterally. Pain radiates around the front of his abdomen (not through), to his shoulders, and down his arms. Pain occasionally radiates down both legs but pain primarily radiates down the RLE. He reports no change in pain as the day progresses. Pain occasionally wakes him up at night. Previously he was sleeping all night. Worst pain: 10/10, Best: 4/10, Present: 6/10. Pt initially denies history of any similar pain however at other times reports that he had comparable low back pain when he was first diagnosed with fibromyalgia in 2014. Pt reports loss of intermittent loss bladder control since his back pain started but not loss of  bowel control. He reports that he disclosed this information to his physician. He also complains of intermittent numbness in the R groin area, which he feels run down his right leg. Denies chills, fevers, or night sweats. Confirms weight loss of 22# since symptoms first began in [place]. Pt reports a prior history of bilateral  shoulder and neck pain which feels better when his "vertebra in upper back slips back in." He reports that chiropractic manipulation improves his shoulder pain, chest pain, and difficulty with swallowing. History of R SAD January 2016. Per patient report he had MRI of both cervical and thoracic spine without any findings. Since 03/11/16 he has had 4 ER visits, 4 PCP visits,  1 cardiology visit, 1 visit to spine specialist, and 1 GI visit. He saw GI due to dysphagia and per note plan is for upper endoscopy.    Limitations Walking   Diagnostic tests MRI of neck and thoracic spine: WNL   Patient Stated Goals Be able to work pain free and return to martial arts   Currently in Pain? No/denies         TREATMENT  Manual Therapy Extensive STM to pec major and minor bilaterally with instrument assist, fanning and sweeping strokes utilized, focused on intercostals and close to sternum; Pec major/minor length assessed without any restriction or pain noted;   Ther-ex Pt instructed in isometric pec strengthening (approximately 20% MVC) in horizontal adduction position on doorway (chest fly position) and wall press-up position; Written HEP provided to patient with extensive education;                       PT Education - 06/02/16 1357    Education provided Yes   Education Details HEP progressed   Person(s) Educated Patient   Methods Explanation   Comprehension Verbalized understanding             PT Long Term Goals - 05/01/16 1007      PT LONG TERM GOAL #1   Title Pt will be independent with HEP in order to decrease pain and improve strength in order to improve pain-free function at home and work.   Baseline --   Time 6   Period Weeks   Status New     PT LONG TERM GOAL #2   Title Pt will decrease mODI scoreby at least 13 points in order demonstrate clinically significant reduction in pain/disability    Baseline 04/30/16: 76%   Time 6   Period Weeks   Status New      PT LONG TERM GOAL #3   Title Pt will decrease worst low back pain to 8/10 or below with aggravating activities in order to improve pain-free function at home and work   Baseline 04/30/16: worst: 10/10   Time 6   Period Weeks   Status New               Plan - 06/02/16 1357    Clinical Impression Statement Significant improvement in upper and lower back pain since starting therapy. Chest pain continues to persist without much improvement from therapy. Good pec major/minor length noted today. Only minimally tender to palpation of chest near sternum. Pt provided gentle isometrics for chest to start light strengthening for analgesic effects. Continue additional HEP and follow-up as scheduled.    Rehab Potential Fair   Clinical Impairments Affecting Rehab Potential Positives: Age, family support, Negatives: Job requirements, chronic pain, multiple physical complaints, psychosocial factors   PT Frequency  2x / week   PT Duration 6 weeks   PT Treatment/Interventions ADLs/Self Care Home Management;Therapeutic activities;Therapeutic exercise;Manual techniques;Passive range of motion;Aquatic Therapy;Electrical Stimulation;Cryotherapy;Iontophoresis 4mg /ml Dexamethasone;Moist Heat;Traction;Ultrasound;Gait training;Neuromuscular re-education;Patient/family education  No Dry Needling per MD order   PT Next Visit Plan Pain control as needed with modalities, thoracic mobilizations if needed, progressive strengthening program (starting with isometrics) once pt can tolerate for chest and back.    PT Home Exercise Plan Seated R piriformis stretch, thoracic extension and extension/rotation over foam roller, foam rolling or MF release ball/tennis ball for pec, prone on elbows multiple times/day x 5 minute at a time; Discontinue all rows and pec stretches, perform frequent icing, continue antiinflammatories as prescribed by PCP. Pec isometrics in horizontal adduction and wall press-up position   Consulted and  Agree with Plan of Care Patient      Patient will benefit from skilled therapeutic intervention in order to improve the following deficits and impairments:  Postural dysfunction, Pain, Decreased range of motion, Decreased mobility, Decreased strength, Difficulty walking, Increased muscle spasms, Impaired sensation  Visit Diagnosis: Pain in thoracic spine  Acute bilateral low back pain with right-sided sciatica     Problem List Patient Active Problem List   Diagnosis Date Noted  . Depression, major, single episode, moderate ([place]) 05/31/2016  . Generalized anxiety disorder 05/31/2016  . Melena   . Rectal polyp   . Problems with swallowing and mastication   . 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 Hypertension 04/06/2013  . Allergic rhinitis 01/18/2013  . Family history of long QT syndrome 06/12/2012  . 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 Smoker 06/12/2012  . Common migraine 05/26/2012  . Fibromyalgia 05/26/2012   Phillips Grout PT, DPT   Darol Cush 06/02/2016, 2:04 PM  [facility] [address] [place], [address] Phone: [phone]   Fax:  [phone]  Name: CROSLEY STEJSKAL MRN: [id number] Date of Birth: August 15, 1975

## 2016-05-30 NOTE — Progress Notes (Signed)
Pre visit review using our clinic review tool, if applicable. No additional management support is needed unless otherwise documented below in the visit note. 

## 2016-05-31 DIAGNOSIS — F321 Major depressive disorder, single episode, moderate: Secondary | ICD-10-CM | POA: Insufficient documentation

## 2016-05-31 DIAGNOSIS — F411 Generalized anxiety disorder: Secondary | ICD-10-CM | POA: Insufficient documentation

## 2016-05-31 HISTORY — DX: Generalized anxiety disorder: F41.1

## 2016-06-03 ENCOUNTER — Other Ambulatory Visit: Payer: Self-pay

## 2016-06-03 ENCOUNTER — Ambulatory Visit: Payer: BLUE CROSS/BLUE SHIELD

## 2016-06-03 DIAGNOSIS — M5441 Lumbago with sciatica, right side: Secondary | ICD-10-CM

## 2016-06-03 DIAGNOSIS — M25511 Pain in right shoulder: Secondary | ICD-10-CM | POA: Diagnosis not present

## 2016-06-03 DIAGNOSIS — M25512 Pain in left shoulder: Secondary | ICD-10-CM | POA: Diagnosis not present

## 2016-06-03 DIAGNOSIS — M546 Pain in thoracic spine: Secondary | ICD-10-CM

## 2016-06-03 DIAGNOSIS — G8929 Other chronic pain: Secondary | ICD-10-CM | POA: Diagnosis not present

## 2016-06-03 MED ORDER — HYDROCHLOROTHIAZIDE 12.5 MG PO CAPS: 12.5000 mg | ORAL_CAPSULE | Freq: Every day | ORAL | 5 refills | Status: DC

## 2016-06-03 NOTE — Telephone Encounter (Signed)
Mrs Keltz left v/m(DPR signed) requesting refill BP med to [facility]. Last refilled HCTZ # 30 x 5 on 11/24/14. Last seen 05/30/16. Left v/m requesting cb to verify name of med.No future appt scheduled.

## 2016-06-03 NOTE — Therapy (Signed)
[facility] [address], [place], [address] Phone: [phone]   Fax:  [phone]  Physical Therapy Treatment  Patient Details  Name: Raymond [name] MRN: [id number] Date of Birth: [birth year] Referring Provider: Karenann Cai PA-C  Encounter Date: 06/03/2016      PT End of Session - 06/03/16 1101    Visit Number 9   Number of Visits 13   Date for PT Re-Evaluation 06/11/16   PT Start Time 1105   PT Stop Time 1145   PT Time Calculation (min) 40 min   Activity Tolerance Patient tolerated treatment well   Behavior During Therapy [facility] for tasks assessed/performed      Past Medical History:  Diagnosis Date  . Biceps tendonitis on right 01/2014  . History of gastric ulcer    summer 2015  . Hypertension    has not been taking his medication; advised to start back on med. today  . 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 Shoulder impingement 01/2014   right    Past Surgical History:  Procedure Laterality Date  . CHOLECYSTECTOMY    . COLONOSCOPY WITH PROPOFOL N/A 05/14/2016   Procedure: COLONOSCOPY WITH PROPOFOL;  Surgeon: Lucilla Lame, MD;  Location: [facility];  Service: Endoscopy;  Laterality: N/A;  . ESOPHAGOGASTRODUODENOSCOPY (EGD) WITH PROPOFOL N/A 05/14/2016   Procedure: ESOPHAGOGASTRODUODENOSCOPY (EGD) WITH PROPOFOL;  Surgeon: Lucilla Lame, MD;  Location: [facility];  Service: Endoscopy;  Laterality: N/A;  . RESECTION DISTAL CLAVICAL Right 02/21/2014   Procedure: RESECTION DISTAL CLAVICAL;  Surgeon: Nita Sells, MD;  Location: [place];  Service: Orthopedics;  Laterality: Right;  . SHOULDER ARTHROSCOPY WITH SUBACROMIAL DECOMPRESSION Right 02/21/2014   Procedure: SHOULDER ARTHROSCOPY WITH SUBACROMIAL DECOMPRESSION, DISTAL CLAVICAL EXCISION, DEBRIDIMENT OF PARTIAL ROTATOR CUFF TEAR;  Surgeon: Nita Sells, MD;  Location: [facility];   Service: Orthopedics;  Laterality: Right;  Right shoulder arthroscopy subacromial decompression, distal clavical excision    There were no vitals filed for this visit.      Subjective Assessment - 06/03/16 1101    Subjective Pt reports approximatley 80% improvement since starting therapy with respect to combined chest, low back, and upper back pain. He is performing HEP and started isometric chest strengthening exercises without increase in pain. No specific questions or concerns at this time.    Pertinent History Pt reports that he was lifting a bunch of things around the house on February 5th and he noticed that he started having low back pain the following day. He does not recall a particular mechanism of injury. He describes the pain as "nerve" pain as well as "achy, sharp, and dull." "It feels like I'm getting shocked." Pt reports pain occurs sometimes unilaterally in low back, sometimes alternates between sides, and sometimes concurrently bilaterally. Pain radiates around the front of his abdomen (not through), to his shoulders, and down his arms. Pain occasionally radiates down both legs but pain primarily radiates down the RLE. He reports no change in pain as the day progresses. Pain occasionally wakes him up at night. Previously he was sleeping all night. Worst pain: 10/10, Best: 4/10, Present: 6/10. Pt initially denies history of any similar pain however at other times reports that he had comparable low back pain when he was first diagnosed with fibromyalgia in 2014. Pt reports loss of intermittent loss bladder control since his back pain started but not loss of bowel control. He reports that he  disclosed this information to his physician. He also complains of intermittent numbness in the R groin area, which he feels run down his right leg. Denies chills, fevers, or night sweats. Confirms weight loss of 22# since symptoms first began in [place]. Pt reports a prior history of bilateral shoulder and  neck pain which feels better when his "vertebra in upper back slips back in." He reports that chiropractic manipulation improves his shoulder pain, chest pain, and difficulty with swallowing. History of R SAD January 2016. Per patient report he had MRI of both cervical and thoracic spine without any findings. Since 03/11/16 he has had 4 ER visits, 4 PCP visits,  1 cardiology visit, 1 visit to spine specialist, and 1 GI visit. He saw GI due to dysphagia and per note plan is for upper endoscopy.    Limitations Walking   Diagnostic tests MRI of neck and thoracic spine: WNL   Patient Stated Goals Be able to work pain free and return to martial arts   Currently in Pain? Yes   Pain Score 2    Pain Location Chest   Pain Orientation Right   Pain Descriptors / Indicators Aching   Pain Type Chronic pain   Pain Onset More than a month ago           TREATMENT  Manual Therapy Extensive STM to pec major and minor bilaterally with instrument assist, fanning and sweeping strokes utilized, focused on intercostals and area close to sternum;   Ther-ex Isometric chest fly 10s hold x 10 bilateral; Isometric chest press 10s hold x 10 bilateral; OMEGA rows in very short range to avoid shoulder extension past neutral 20# 2 x 10, pain-free; OMEGA chest press in very short range to avoid shoulder extension past neutral 20# 2 x 10, pain-free; Pt provided handout regarding sleep restriction therapy with education; Encouraged pt to download "Calm" application and complete free 7 day program for introduction to mindfulness for decreased anxiety and to aid with insomnia;                      PT Education - 06/03/16 1101    Education provided Yes   Education Details HEP reinforced   Person(s) Educated Patient   Methods Explanation   Comprehension Verbalized understanding             PT Long Term Goals - 05/01/16 1007      PT LONG TERM GOAL #1   Title Pt will be independent with  HEP in order to decrease pain and improve strength in order to improve pain-free function at home and work.   Baseline --   Time 6   Period Weeks   Status New     PT LONG TERM GOAL #2   Title Pt will decrease mODI scoreby at least 13 points in order demonstrate clinically significant reduction in pain/disability    Baseline 04/30/16: 76%   Time 6   Period Weeks   Status New     PT LONG TERM GOAL #3   Title Pt will decrease worst low back pain to 8/10 or below with aggravating activities in order to improve pain-free function at home and work   Baseline 04/30/16: worst: 10/10   Time 6   Period Weeks   Status New               Plan - 06/03/16 1102    Clinical Impression Statement No pain reported during STM and chest strengthening today.  Pt provided education regarding effect of insomnia on chronic pain. Pt provided handout discussing how to design a program for sleep restriction therapy. Pt also educated about mindfullness on anxiety and sleep. Encouraged pt to download "Calm" application onto his phone and attempt the free 7 day mindfullness program. Pt encouraged to follow-up as scheduled.    Rehab Potential Fair   Clinical Impairments Affecting Rehab Potential Positives: Age, family support, Negatives: Job requirements, chronic pain, multiple physical complaints, psychosocial factors   PT Frequency 2x / week   PT Duration 6 weeks   PT Treatment/Interventions ADLs/Self Care Home Management;Therapeutic activities;Therapeutic exercise;Manual techniques;Passive range of motion;Aquatic Therapy;Electrical Stimulation;Cryotherapy;Iontophoresis 4mg /ml Dexamethasone;Moist Heat;Traction;Ultrasound;Gait training;Neuromuscular re-education;Patient/family education  No Dry Needling per MD order   PT Next Visit Plan Outcome measures, update goals with patient, STM, progressive strengthening program for chest and back as pt can tolerate without increase in pain.    PT Home Exercise Plan Seated  R piriformis stretch, thoracic extension and extension/rotation over foam roller, foam rolling or MF release ball/tennis ball for pec, prone on elbows multiple times/day x 5 minute at a time; Discontinue all rows and pec stretches, perform frequent icing, continue antiinflammatories as prescribed by PCP. Pec isometrics in horizontal adduction and wall press-up position   Consulted and Agree with Plan of Care Patient      Patient will benefit from skilled therapeutic intervention in order to improve the following deficits and impairments:  Postural dysfunction, Pain, Decreased range of motion, Decreased mobility, Decreased strength, Difficulty walking, Increased muscle spasms, Impaired sensation  Visit Diagnosis: Pain in thoracic spine  Acute bilateral low back pain with right-sided sciatica     Problem List Patient Active Problem List   Diagnosis Date Noted  . Depression, major, single episode, moderate ([place]) 05/31/2016  . Generalized anxiety disorder 05/31/2016  . Melena   . Rectal polyp   . Problems with swallowing and mastication   . 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 Hypertension 04/06/2013  . Allergic rhinitis 01/18/2013  . Family history of long QT syndrome 06/12/2012  . 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 Smoker 06/12/2012  . Common migraine 05/26/2012  . Fibromyalgia 05/26/2012   Phillips Grout PT, DPT   Brave Dack 06/03/2016, 1:22 PM  [facility] [address], [place], [address] Phone: [phone]   Fax:  [phone]  Name: Raymond [name] MRN: [id number] Date of Birth: 1975/06/24

## 2016-06-03 NOTE — Telephone Encounter (Signed)
Raymond [name] called back and requested med is HCTZ; pt had stopped taking med until several ED visits at first of this year. Pt started taking regularly first of Feb 2018. Is it OK to refill to [facility].

## 2016-06-05 ENCOUNTER — Encounter: Payer: Self-pay | Admitting: Family Medicine

## 2016-06-05 MED ORDER — ZOLPIDEM TARTRATE ER 12.5 MG PO TBCR: 12.5000 mg | EXTENDED_RELEASE_TABLET | Freq: Every evening | ORAL | 3 refills | Status: DC | PRN

## 2016-06-05 NOTE — Telephone Encounter (Signed)
Can you call him  Stop the trazodone.  Ambien CR 12.5 mg, 1 po qhs prn insomnia. #30, 3 ref  If this doesn't work, then we may have to try lowering his adderrall dose - particularly the 2nd dose.

## 2016-06-05 NOTE — Telephone Encounter (Signed)
Zolpidem ER 12.5 mg called into [facility] [address]

## 2016-06-06 ENCOUNTER — Ambulatory Visit: Payer: BLUE CROSS/BLUE SHIELD

## 2016-06-06 DIAGNOSIS — M5441 Lumbago with sciatica, right side: Secondary | ICD-10-CM | POA: Diagnosis not present

## 2016-06-06 DIAGNOSIS — G8929 Other chronic pain: Secondary | ICD-10-CM | POA: Diagnosis not present

## 2016-06-06 DIAGNOSIS — M25512 Pain in left shoulder: Secondary | ICD-10-CM | POA: Diagnosis not present

## 2016-06-06 DIAGNOSIS — M546 Pain in thoracic spine: Secondary | ICD-10-CM

## 2016-06-06 DIAGNOSIS — M25511 Pain in right shoulder: Secondary | ICD-10-CM | POA: Diagnosis not present

## 2016-06-06 NOTE — Therapy (Signed)
[facility] [address], [place], [address] Phone: [phone]   Fax:  [phone]  Physical Therapy Treatment  Patient Details  Name: Raymond [name] MRN: [id number] Date of Birth: Nov 16, 1975 Referring Provider: Karenann Cai PA-C  Encounter Date: 06/06/2016      PT End of Session - 06/06/16 1432    Visit Number 10   Number of Visits 13   Date for PT Re-Evaluation 06/11/16   PT Start Time 1430   PT Stop Time 1515   PT Time Calculation (min) 45 min   Activity Tolerance Patient tolerated treatment well   Behavior During Therapy [facility] for tasks assessed/performed      Past Medical History:  Diagnosis Date  . Biceps tendonitis on right 01/2014  . History of gastric ulcer    summer 2015  . Hypertension    has not been taking his medication; advised to start back on med. today  . 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 Shoulder impingement 01/2014   right    Past Surgical History:  Procedure Laterality Date  . CHOLECYSTECTOMY    . COLONOSCOPY WITH PROPOFOL N/A 05/14/2016   Procedure: COLONOSCOPY WITH PROPOFOL;  Surgeon: Lucilla Lame, MD;  Location: [facility];  Service: Endoscopy;  Laterality: N/A;  . ESOPHAGOGASTRODUODENOSCOPY (EGD) WITH PROPOFOL N/A 05/14/2016   Procedure: ESOPHAGOGASTRODUODENOSCOPY (EGD) WITH PROPOFOL;  Surgeon: Lucilla Lame, MD;  Location: [facility] ENDOSCOPY;  Service: Endoscopy;  Laterality: N/A;  . RESECTION DISTAL CLAVICAL Right 02/21/2014   Procedure: RESECTION DISTAL CLAVICAL;  Surgeon: Nita Sells, MD;  Location: [place] [facility];  Service: Orthopedics;  Laterality: Right;  . SHOULDER ARTHROSCOPY WITH SUBACROMIAL DECOMPRESSION Right 02/21/2014   Procedure: SHOULDER ARTHROSCOPY WITH SUBACROMIAL DECOMPRESSION, DISTAL CLAVICAL EXCISION, DEBRIDIMENT OF PARTIAL ROTATOR CUFF TEAR;  Surgeon: Nita Sells, MD;  Location: [facility];   Service: Orthopedics;  Laterality: Right;  Right shoulder arthroscopy subacromial decompression, distal clavical excision    There were no vitals filed for this visit.      Subjective Assessment - 06/06/16 1428    Subjective Pt reports he is doing well today. His chest was sore after the last therapy session and the following day but improved today. No sharp pains. No specific questions or concerns currently.    Pertinent History Pt reports that he was lifting a bunch of things around the house on February 5th and he noticed that he started having low back pain the following day. He does not recall a particular mechanism of injury. He describes the pain as "nerve" pain as well as "achy, sharp, and dull." "It feels like I'm getting shocked." Pt reports pain occurs sometimes unilaterally in low back, sometimes alternates between sides, and sometimes concurrently bilaterally. Pain radiates around the front of his abdomen (not through), to his shoulders, and down his arms. Pain occasionally radiates down both legs but pain primarily radiates down the RLE. He reports no change in pain as the day progresses. Pain occasionally wakes him up at night. Previously he was sleeping all night. Worst pain: 10/10, Best: 4/10, Present: 6/10. Pt initially denies history of any similar pain however at other times reports that he had comparable low back pain when he was first diagnosed with fibromyalgia in 2014. Pt reports loss of intermittent loss bladder control since his back pain started but not loss of bowel control. He reports that he disclosed this information to his physician. He also complains  of intermittent numbness in the R groin area, which he feels run down his right leg. Denies chills, fevers, or night sweats. Confirms weight loss of 22# since symptoms first began in [place]. Pt reports a prior history of bilateral shoulder and neck pain which feels better when his "vertebra in upper back slips back in." He  reports that chiropractic manipulation improves his shoulder pain, chest pain, and difficulty with swallowing. History of R SAD January 2016. Per patient report he had MRI of both cervical and thoracic spine without any findings. Since 03/11/16 he has had 4 ER visits, 4 PCP visits,  1 cardiology visit, 1 visit to spine specialist, and 1 GI visit. He saw GI due to dysphagia and per note plan is for upper endoscopy.    Limitations Walking   Diagnostic tests MRI of neck and thoracic spine: WNL   Patient Stated Goals Be able to work pain free and return to martial arts   Currently in Pain? No/denies            [facility] PT Assessment - 06/06/16 1441      Observation/Other Assessments   Other Surveys  Other Surveys   Modified [place] 8%      TREATMENT  Manual Therapy Pt completed modified ODI (unbilled); Updated goals with patient, worst low back pain 4/10, worst chest pain 4/10, worst upper back pain 0/10; ExtensiveSTM to pec major and minor bilaterally with instrument assist, fanning and sweeping strokes utilized, focused on intercostals and area close to sternum;  Ther-ex Isometric chest fly 10s hold x 10 bilateral; OMEGA rows in very short range to avoid shoulder extension past neutral 20# 2 x 15, pain-free; OMEGA chest press in very short range to avoid shoulder extension past neutral 20# 2 x 15, pain-free; Wall push up in very small range with very light body weight ankle 2 x 10 (added to HEP);                        PT Education - 06/06/16 1432    Education provided Yes   Education Details HEP progressed, plan of care   Person(s) Educated Patient   Methods Explanation   Comprehension Verbalized understanding             PT Long Term Goals - 06/06/16 1432      PT LONG TERM GOAL #1   Title Pt will be independent with HEP in order to decrease pain and improve strength in order to improve pain-free function at home and work.   Time 6   Period Weeks    Status On-going     PT LONG TERM GOAL #2   Title Pt will decrease mODI scoreby at least 13 points in order demonstrate clinically significant reduction in pain/disability    Baseline 04/30/16: 76%, 06/06/16: 8%   Time 6   Period Weeks   Status Achieved     PT LONG TERM GOAL #3   Title Pt will decrease worst low back pain to 8/10 or below with aggravating activities in order to improve pain-free function at home and work   Baseline 04/30/16: worst: 10/10; 06/06/08: worst: 4/10;   Time 6   Period Weeks   Status Achieved               Plan - 06/06/16 1432    Clinical Impression Statement No pain reported during STM or exercises today. Pt demonstrating considerable improvement in his symptoms. Modified ODI decreased from 76%  at initial evaluation to 8% today. Pt reports complete resolution of upper back pain. Chest pain is still present but significantly improved. Pt encouraged to add short range wall push-ups every other day to HEP. Follow-up as scheduled.    Rehab Potential Fair   Clinical Impairments Affecting Rehab Potential Positives: Age, family support, Negatives: Job requirements, chronic pain, multiple physical complaints, psychosocial factors   PT Frequency 2x / week   PT Duration 6 weeks   PT Treatment/Interventions ADLs/Self Care Home Management;Therapeutic activities;Therapeutic exercise;Manual techniques;Passive range of motion;Aquatic Therapy;Electrical Stimulation;Cryotherapy;Iontophoresis 4mg /ml Dexamethasone;Moist Heat;Traction;Ultrasound;Gait training;Neuromuscular re-education;Patient/family education  No Dry Needling per MD order   PT Next Visit Plan Outcome measures, update goals with patient, STM, progressive strengthening program for chest and back as pt can tolerate without increase in pain.    PT Home Exercise Plan Seated R piriformis stretch, thoracic extension and extension/rotation over foam roller, foam rolling or MF release ball/tennis ball for pec, prone  on elbows multiple times/day x 5 minute at a time, Pec isometrics in horizont adduction, short range wall push-up,  Discontinue all rows and pec stretches, perform frequent icing, continue antiinflammatories as prescribed by PCP.   Consulted and Agree with Plan of Care Patient      Patient will benefit from skilled therapeutic intervention in order to improve the following deficits and impairments:  Postural dysfunction, Pain, Decreased range of motion, Decreased mobility, Decreased strength, Difficulty walking, Increased muscle spasms, Impaired sensation  Visit Diagnosis: Pain in thoracic spine  Acute bilateral low back pain with right-sided sciatica     Problem List Patient Active Problem List   Diagnosis Date Noted  . Depression, major, single episode, moderate ([place]) 05/31/2016  . Generalized anxiety disorder 05/31/2016  . Melena   . Rectal polyp   . Problems with swallowing and mastication   . 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 Hypertension 04/06/2013  . Allergic rhinitis 01/18/2013  . Family history of long QT syndrome 06/12/2012  . 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 Smoker 06/12/2012  . Common migraine 05/26/2012  . Fibromyalgia 05/26/2012   Phillips Grout PT, DPT   Jillienne Egner 06/06/2016, 3:22 PM  [facility] [address], [place], [address] Phone: [phone]   Fax:  [phone]  Name: KEARY WATERSON MRN: [id number] Date of Birth: 08/18/1975

## 2016-06-10 ENCOUNTER — Ambulatory Visit: Payer: BLUE CROSS/BLUE SHIELD

## 2016-06-10 DIAGNOSIS — M25511 Pain in right shoulder: Secondary | ICD-10-CM

## 2016-06-10 DIAGNOSIS — M25512 Pain in left shoulder: Secondary | ICD-10-CM

## 2016-06-10 DIAGNOSIS — G8929 Other chronic pain: Secondary | ICD-10-CM

## 2016-06-10 DIAGNOSIS — M5441 Lumbago with sciatica, right side: Secondary | ICD-10-CM

## 2016-06-10 DIAGNOSIS — M546 Pain in thoracic spine: Secondary | ICD-10-CM

## 2016-06-10 NOTE — Therapy (Signed)
[facility] [address], [place], [address] Phone: [phone]   Fax:  [phone]  Physical Therapy Treatment  Patient Details  Name: Raymond [name] MRN: [id number] Date of Birth: Jul 11, 1975 Referring Provider: Karenann Cai PA-C  Encounter Date: 06/10/2016      PT End of Session - 06/10/16 1702    Visit Number 11   Number of Visits 13   Date for PT Re-Evaluation 06/11/16   PT Start Time 2947   PT Stop Time 1700   PT Time Calculation (min) 45 min   Activity Tolerance Patient tolerated treatment well   Behavior During Therapy [facility] for tasks assessed/performed      Past Medical History:  Diagnosis Date  . Biceps tendonitis on right 01/2014  . History of gastric ulcer    summer 2015  . Hypertension    has not been taking his medication; advised to start back on med. today  . 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 Shoulder impingement 01/2014   right    Past Surgical History:  Procedure Laterality Date  . CHOLECYSTECTOMY    . COLONOSCOPY WITH PROPOFOL N/A 05/14/2016   Procedure: COLONOSCOPY WITH PROPOFOL;  Surgeon: Lucilla Lame, MD;  Location: [facility];  Service: Endoscopy;  Laterality: N/A;  . ESOPHAGOGASTRODUODENOSCOPY (EGD) WITH PROPOFOL N/A 05/14/2016   Procedure: ESOPHAGOGASTRODUODENOSCOPY (EGD) WITH PROPOFOL;  Surgeon: Lucilla Lame, MD;  Location: [facility];  Service: Endoscopy;  Laterality: N/A;  . RESECTION DISTAL CLAVICAL Right 02/21/2014   Procedure: RESECTION DISTAL CLAVICAL;  Surgeon: Nita Sells, MD;  Location: [place];  Service: Orthopedics;  Laterality: Right;  . SHOULDER ARTHROSCOPY WITH SUBACROMIAL DECOMPRESSION Right 02/21/2014   Procedure: SHOULDER ARTHROSCOPY WITH SUBACROMIAL DECOMPRESSION, DISTAL CLAVICAL EXCISION, DEBRIDIMENT OF PARTIAL ROTATOR CUFF TEAR;  Surgeon: Nita Sells, MD;  Location: [place];   Service: Orthopedics;  Laterality: Right;  Right shoulder arthroscopy subacromial decompression, distal clavical excision    There were no vitals filed for this visit.      Subjective Assessment - 06/10/16 1656    Subjective Patient reports no major changes since the last session. States his chest has been feeling much better.    Pertinent History Pt reports that he was lifting a bunch of things around the house on February 5th and he noticed that he started having low back pain the following day. He does not recall a particular mechanism of injury. He describes the pain as "nerve" pain as well as "achy, sharp, and dull." "It feels like I'm getting shocked." Pt reports pain occurs sometimes unilaterally in low back, sometimes alternates between sides, and sometimes concurrently bilaterally. Pain radiates around the front of his abdomen (not through), to his shoulders, and down his arms. Pain occasionally radiates down both legs but pain primarily radiates down the RLE. He reports no change in pain as the day progresses. Pain occasionally wakes him up at night. Previously he was sleeping all night. Worst pain: 10/10, Best: 4/10, Present: 6/10. Pt initially denies history of any similar pain however at other times reports that he had comparable low back pain when he was first diagnosed with fibromyalgia in 2014. Pt reports loss of intermittent loss bladder control since his back pain started but not loss of bowel control. He reports that he disclosed this information to his physician. He also complains of intermittent numbness in the R groin area, which he feels run down his right  leg. Denies chills, fevers, or night sweats. Confirms weight loss of 22# since symptoms first began in [place]. Pt reports a prior history of bilateral shoulder and neck pain which feels better when his "vertebra in upper back slips back in." He reports that chiropractic manipulation improves his shoulder pain, chest pain, and  difficulty with swallowing. History of R SAD January 2016. Per patient report he had MRI of both cervical and thoracic spine without any findings. Since 03/11/16 he has had 4 ER visits, 4 PCP visits,  1 cardiology visit, 1 visit to spine specialist, and 1 GI visit. He saw GI due to dysphagia and per note plan is for upper endoscopy.    Limitations Walking   Diagnostic tests MRI of neck and thoracic spine: WNL   Patient Stated Goals Be able to work pain free and return to martial arts   Currently in Pain? No/denies      TREATMENT Manual Therapy Extensive STM to pec major and minor bilaterally; fanning and sweeping strokes utilized, focused on intercostals and area close to sternum;   Ther-ex OMEGA seated rows with 25# 2 x 15, pain-free; OMEGA chest press in very short range to avoid shoulder extension past neutral 35# 2 x 15, pain-free; Standing straight arm push downs at [place] - 20# x15; #15 x 15  Standing low rows at [place] -- 20# x15; 15# x15 Ball rolls while on elbows at wall - x10 Wall angels facing wall - x10 Push up against wall with emphasis on activating - x15        PT Education - 06/10/16 1702    Education provided Yes   Education Details Form/technique with exercises   Person(s) Educated Patient   Methods Explanation;Demonstration   Comprehension Verbalized understanding;Returned demonstration             PT Long Term Goals - 06/06/16 1432      PT LONG TERM GOAL #1   Title Pt will be independent with HEP in order to decrease pain and improve strength in order to improve pain-free function at home and work.   Time 6   Period Weeks   Status On-going     PT LONG TERM GOAL #2   Title Pt will decrease mODI scoreby at least 13 points in order demonstrate clinically significant reduction in pain/disability    Baseline 04/30/16: 76%, 06/06/16: 8%   Time 6   Period Weeks   Status Achieved     PT LONG TERM GOAL #3   Title Pt will decrease worst low back pain to  8/10 or below with aggravating activities in order to improve pain-free function at home and work   Baseline 04/30/16: worst: 10/10; 06/06/08: worst: 4/10;   Time 6   Period Weeks   Status Achieved               Plan - 06/10/16 1703    Clinical Impression Statement Patient demonstrates improvement in muscular endurance and coordination with ability to perform greater amount of exercises without fatigue/pain. Progressed exercises according to decreased pain; although patient is improving, he continues to demonstrate decreased muscular strength with exercise and will benefit from further skilled therapy to return to prior level of function.    Rehab Potential Fair   Clinical Impairments Affecting Rehab Potential Positives: Age, family support, Negatives: Job requirements, chronic pain, multiple physical complaints, psychosocial factors   PT Frequency 2x / week   PT Duration 6 weeks   PT Treatment/Interventions ADLs/Self Care Home Management;Therapeutic  activities;Therapeutic exercise;Manual techniques;Passive range of motion;Aquatic Therapy;Electrical Stimulation;Cryotherapy;Iontophoresis 4mg /ml Dexamethasone;Moist Heat;Traction;Ultrasound;Gait training;Neuromuscular re-education;Patient/family education  No Dry Needling per MD order   PT Next Visit Plan Outcome measures, update goals with patient, STM, progressive strengthening program for chest and back as pt can tolerate without increase in pain.    PT Home Exercise Plan Seated R piriformis stretch, thoracic extension and extension/rotation over foam roller, foam rolling or MF release ball/tennis ball for pec, prone on elbows multiple times/day x 5 minute at a time, Pec isometrics in horizont adduction, short range wall push-up,  Discontinue all rows and pec stretches, perform frequent icing, continue antiinflammatories as prescribed by PCP.   Consulted and Agree with Plan of Care Patient      Patient will benefit from skilled therapeutic  intervention in order to improve the following deficits and impairments:  Postural dysfunction, Pain, Decreased range of motion, Decreased mobility, Decreased strength, Difficulty walking, Increased muscle spasms, Impaired sensation  Visit Diagnosis: Pain in thoracic spine  Acute bilateral low back pain with right-sided sciatica  Chronic pain of both shoulders     Problem List Patient Active Problem List   Diagnosis Date Noted  . Depression, major, single episode, moderate ([place]) 05/31/2016  . Generalized anxiety disorder 05/31/2016  . Melena   . Rectal polyp   . Problems with swallowing and mastication   . 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 Hypertension 04/06/2013  . Allergic rhinitis 01/18/2013  . Family history of long QT syndrome 06/12/2012  . 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 Smoker 06/12/2012  . Common migraine 05/26/2012  . Fibromyalgia 05/26/2012    Blythe Stanford, PT DPT 06/10/2016, 5:06 PM  [facility] [address] [place], [address] Phone: [phone]   Fax:  [phone]  Name: MARCELO ICKES MRN: [id number] Date of Birth: 29-Sep-1975

## 2016-06-19 ENCOUNTER — Encounter: Payer: Self-pay | Admitting: Family Medicine

## 2016-06-19 ENCOUNTER — Other Ambulatory Visit: Payer: Self-pay | Admitting: Family Medicine

## 2016-06-19 MED ORDER — KETOPROFEN 75 MG PO CAPS: 75.0000 mg | ORAL_CAPSULE | Freq: Three times a day (TID) | ORAL | 2 refills | Status: DC

## 2016-06-19 NOTE — Telephone Encounter (Signed)
He should not be doing this! The maximum dose is 20 mg!  I am ok giving him 20 mg a day, but he cannot change that or increase it on his own.

## 2016-06-19 NOTE — Telephone Encounter (Signed)
Patient states he is taking 3 tablets daily.  Med list states 1 daily.  Please advise.  Ok to refill for year?

## 2016-06-20 ENCOUNTER — Ambulatory Visit: Payer: BLUE CROSS/BLUE SHIELD | Attending: Orthopedic Surgery

## 2016-06-20 DIAGNOSIS — G8929 Other chronic pain: Secondary | ICD-10-CM

## 2016-06-20 DIAGNOSIS — M546 Pain in thoracic spine: Secondary | ICD-10-CM | POA: Diagnosis not present

## 2016-06-20 DIAGNOSIS — M25511 Pain in right shoulder: Secondary | ICD-10-CM | POA: Diagnosis not present

## 2016-06-20 DIAGNOSIS — M5441 Lumbago with sciatica, right side: Secondary | ICD-10-CM | POA: Diagnosis not present

## 2016-06-20 DIAGNOSIS — M25512 Pain in left shoulder: Secondary | ICD-10-CM | POA: Diagnosis not present

## 2016-06-20 MED ORDER — ESCITALOPRAM OXALATE 10 MG PO TABS: 10.0000 mg | ORAL_TABLET | Freq: Every day | ORAL | 1 refills | Status: DC

## 2016-06-20 NOTE — Telephone Encounter (Signed)
[name] called back to confirm that Raymond [name] is only taking one lexapro daily but would like refills on file at pharmacy.  Refills sent as requested.  Please advise about Ketoprofen.

## 2016-06-20 NOTE — Telephone Encounter (Signed)
I thought he was only taking this prn.  Not entirely unreasonable with his pain issues to take TID.   Ok to increase total to 90, 3 refills

## 2016-06-20 NOTE — Telephone Encounter (Signed)
Spoke with Kenney Houseman (wife).  She thinks Apostolos was referring to the Ketoprofen and not the Lexapro as far as taking three times a day.   I advised her Dr. Lorelei Pont did refilled his Ketoprofen yesterday at [facility] but we needed to clarify and make sure that Dajuan is not taking Lexapro three times a day.  She will speak will [facility] and call me back.  We think he accidentally clicked on the Lexapro instead of the Ketoprofen when doing the refill request on MyChart.  Did you mean to only give #30 on Ketoprofen refill? That is only a 10 day supply?  Please advise.

## 2016-06-20 NOTE — Therapy (Signed)
[facility] [address], [place], [address] Phone: [phone]   Fax:  [phone]  Physical Therapy Treatment  Patient Details  Name: Raymond [name] MRN: [id number] Date of Birth: Jul 07, 1975 Referring Provider: Karenann Cai PA-C  Encounter Date: 06/20/2016      PT End of Session - 06/20/16 1442    Visit Number 12   Number of Visits 21   Date for PT Re-Evaluation 07/18/16   PT Start Time 9767   PT Stop Time 1430   PT Time Calculation (min) 45 min   Activity Tolerance Patient tolerated treatment well   Behavior During Therapy [facility] for tasks assessed/performed      Past Medical History:  Diagnosis Date  . Biceps tendonitis on right 01/2014  . History of gastric ulcer    summer 2015  . Hypertension    has not been taking his medication; advised to start back on med. today  . 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 Shoulder impingement 01/2014   right    Past Surgical History:  Procedure Laterality Date  . CHOLECYSTECTOMY    . COLONOSCOPY WITH PROPOFOL N/A 05/14/2016   Procedure: COLONOSCOPY WITH PROPOFOL;  Surgeon: Lucilla Lame, MD;  Location: [facility];  Service: Endoscopy;  Laterality: N/A;  . ESOPHAGOGASTRODUODENOSCOPY (EGD) WITH PROPOFOL N/A 05/14/2016   Procedure: ESOPHAGOGASTRODUODENOSCOPY (EGD) WITH PROPOFOL;  Surgeon: Lucilla Lame, MD;  Location: [facility];  Service: Endoscopy;  Laterality: N/A;  . RESECTION DISTAL CLAVICAL Right 02/21/2014   Procedure: RESECTION DISTAL CLAVICAL;  Surgeon: Nita Sells, MD;  Location: [place];  Service: Orthopedics;  Laterality: Right;  . SHOULDER ARTHROSCOPY WITH SUBACROMIAL DECOMPRESSION Right 02/21/2014   Procedure: SHOULDER ARTHROSCOPY WITH SUBACROMIAL DECOMPRESSION, DISTAL CLAVICAL EXCISION, DEBRIDIMENT OF PARTIAL ROTATOR CUFF TEAR;  Surgeon: Nita Sells, MD;  Location: [place];   Service: Orthopedics;  Laterality: Right;  Right shoulder arthroscopy subacromial decompression, distal clavical excision    There were no vitals filed for this visit.      Subjective Assessment - 06/20/16 1433    Subjective Patinet reports the chest is feeling much better and reports he still has one tight muscle in his chest.    Pertinent History Pt reports that he was lifting a bunch of things around the house on February 5th and he noticed that he started having low back pain the following day. He does not recall a particular mechanism of injury. He describes the pain as "nerve" pain as well as "achy, sharp, and dull." "It feels like I'm getting shocked." Pt reports pain occurs sometimes unilaterally in low back, sometimes alternates between sides, and sometimes concurrently bilaterally. Pain radiates around the front of his abdomen (not through), to his shoulders, and down his arms. Pain occasionally radiates down both legs but pain primarily radiates down the RLE. He reports no change in pain as the day progresses. Pain occasionally wakes him up at night. Previously he was sleeping all night. Worst pain: 10/10, Best: 4/10, Present: 6/10. Pt initially denies history of any similar pain however at other times reports that he had comparable low back pain when he was first diagnosed with fibromyalgia in 2014. Pt reports loss of intermittent loss bladder control since his back pain started but not loss of bowel control. He reports that he disclosed this information to his physician. He also complains of intermittent numbness in the R groin area, which he feels run down  his right leg. Denies chills, fevers, or night sweats. Confirms weight loss of 22# since symptoms first began in [place]. Pt reports a prior history of bilateral shoulder and neck pain which feels better when his "vertebra in upper back slips back in." He reports that chiropractic manipulation improves his shoulder pain, chest pain, and  difficulty with swallowing. History of R SAD January 2016. Per patient report he had MRI of both cervical and thoracic spine without any findings. Since 03/11/16 he has had 4 ER visits, 4 PCP visits,  1 cardiology visit, 1 visit to spine specialist, and 1 GI visit. He saw GI due to dysphagia and per note plan is for upper endoscopy.    Limitations Walking   Diagnostic tests MRI of neck and thoracic spine: WNL   Patient Stated Goals Be able to work pain free and return to martial arts   Currently in Pain? No/denies         TREATMENT Manual Therapy Extensive STM to pec major and minor bilaterally; fanning and sweeping strokes utilized, focused on intercostals and area close to sternum;   Ther-ex Chest flys on treatment table - 4# x10; 10# in B UE - 2 x20 Chest Press on treatment table - 10# B UE 2 x 20  OMEGA seated rows with 45# 2 x 10, pain-free; OMEGA chest press in normal range to av past neutral 45# 2 x 10, pain-free; Supine single arm chest fly for eccentric motion - and muscle healing x10       PT Education - 06/20/16 1442    Education provided Yes   Education Details HEP: Chest Fly laterally with 20# weight   Person(s) Educated Patient   Methods Explanation;Demonstration   Comprehension Verbalized understanding;Returned demonstration             PT Long Term Goals - 06/20/16 1444      PT LONG TERM GOAL #1   Title Pt will be independent with HEP in order to decrease pain and improve strength in order to improve pain-free function at home and work.   Baseline 06/20/16: Requires significant cueing on progression of exercises throughout pain free AROM   Time 6   Period Weeks   Status On-going     PT LONG TERM GOAL #2   Title Pt will decrease mODI scoreby at least 13 points in order demonstrate clinically significant reduction in pain/disability    Baseline 04/30/16: 76%, 06/06/16: 8%   Time 6   Period Weeks   Status Achieved     PT LONG TERM GOAL #3   Title Pt will  decrease worst low back pain to 8/10 or below with aggravating activities in order to improve pain-free function at home and work   Baseline 04/30/16: worst: 10/10; 06/06/08: worst: 4/10;   Time 6   Period Weeks   Status Achieved     PT LONG TERM GOAL #4   Title Patient will improve chest tightness demonstrating zero trigger points to better perform recreational exercises more succesfully.    Baseline Trigger points throughout R pectoralis Major   Time 4   Period Weeks   Status New               Plan - 06/20/16 1455    Clinical Impression Statement Patient demonstrates improvement in pec major activation and strength with exercises. Patient demonstrates increased trigger points throughout therapy and increased muscle guarding; focused on improving pec strength and coordination throughout movement and patient will benefit from further  skilled therapy focused on improving chest strength and advancing HEP to return to prior level of function.    Rehab Potential Fair   Clinical Impairments Affecting Rehab Potential Positives: Age, family support, Negatives: Job requirements, chronic pain, multiple physical complaints, psychosocial factors   PT Frequency 2x / week   PT Duration 6 weeks   PT Treatment/Interventions ADLs/Self Care Home Management;Therapeutic activities;Therapeutic exercise;Manual techniques;Passive range of motion;Aquatic Therapy;Electrical Stimulation;Cryotherapy;Iontophoresis 4mg /ml Dexamethasone;Moist Heat;Traction;Ultrasound;Gait training;Neuromuscular re-education;Patient/family education  No Dry Needling per MD order   PT Next Visit Plan Outcome measures, update goals with patient, STM, progressive strengthening program for chest and back as pt can tolerate without increase in pain.    PT Home Exercise Plan Seated R piriformis stretch, thoracic extension and extension/rotation over foam roller, foam rolling or MF release ball/tennis ball for pec, prone on elbows multiple  times/day x 5 minute at a time, Pec isometrics in horizont adduction, short range wall push-up,  Discontinue all rows and pec stretches, perform frequent icing, continue antiinflammatories as prescribed by PCP.   Consulted and Agree with Plan of Care Patient      Patient will benefit from skilled therapeutic intervention in order to improve the following deficits and impairments:  Postural dysfunction, Pain, Decreased range of motion, Decreased mobility, Decreased strength, Difficulty walking, Increased muscle spasms, Impaired sensation  Visit Diagnosis: Pain in thoracic spine  Acute bilateral low back pain with right-sided sciatica  Chronic pain of both shoulders     Problem List Patient Active Problem List   Diagnosis Date Noted  . Depression, major, single episode, moderate ([place]) 05/31/2016  . Generalized anxiety disorder 05/31/2016  . Melena   . Rectal polyp   . Problems with swallowing and mastication   . 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 Hypertension 04/06/2013  . Allergic rhinitis 01/18/2013  . Family history of long QT syndrome 06/12/2012  . 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 Smoker 06/12/2012  . Common migraine 05/26/2012  . Fibromyalgia 05/26/2012    Blythe Stanford, PT DPT 06/20/2016, 3:03 PM  [facility] [address], [place], [address] Phone: [phone]   Fax:  [phone]  Name: Raymond [name] MRN: [id number] Date of Birth: 1975-07-06

## 2016-06-21 MED ORDER — KETOPROFEN 75 MG PO CAPS: 75.0000 mg | ORAL_CAPSULE | Freq: Three times a day (TID) | ORAL | 2 refills | Status: DC

## 2016-06-21 MED ORDER — KETOPROFEN 75 MG PO CAPS: 75.0000 mg | ORAL_CAPSULE | Freq: Three times a day (TID) | ORAL | 3 refills | Status: DC

## 2016-06-21 NOTE — Addendum Note (Signed)
Addended by: Carter Kitten on: 06/21/2016 08:41 AM   Modules accepted: Orders

## 2016-06-21 NOTE — Telephone Encounter (Signed)
Refills for Ketoprofen sent to [facility] for #90 with 3 refills.

## 2016-06-25 ENCOUNTER — Ambulatory Visit: Payer: BLUE CROSS/BLUE SHIELD

## 2016-06-25 DIAGNOSIS — G8929 Other chronic pain: Secondary | ICD-10-CM | POA: Diagnosis not present

## 2016-06-25 DIAGNOSIS — M25511 Pain in right shoulder: Secondary | ICD-10-CM | POA: Diagnosis not present

## 2016-06-25 DIAGNOSIS — M5441 Lumbago with sciatica, right side: Secondary | ICD-10-CM

## 2016-06-25 DIAGNOSIS — M546 Pain in thoracic spine: Secondary | ICD-10-CM

## 2016-06-25 DIAGNOSIS — M25512 Pain in left shoulder: Secondary | ICD-10-CM | POA: Diagnosis not present

## 2016-06-25 NOTE — Therapy (Signed)
[facility] [address] [place], [address] Phone: [phone]   Fax:  [phone]  Physical Therapy Treatment  Patient Details  Name: Raymond [name] MRN: [id number] Date of Birth: September 13, 1975 Referring Provider: Karenann Cai PA-C  Encounter Date: 06/25/2016      PT End of Session - 06/25/16 0850    Visit Number 13   Number of Visits 21   Date for PT Re-Evaluation 07/18/16   PT Start Time 0823   PT Stop Time 0901   PT Time Calculation (min) 38 min   Activity Tolerance Patient tolerated treatment well   Behavior During Therapy [facility] for tasks assessed/performed      Past Medical History:  Diagnosis Date  . Biceps tendonitis on right 01/2014  . History of gastric ulcer    summer 2015  . Hypertension    has not been taking his medication; advised to start back on med. today  . 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 Shoulder impingement 01/2014   right    Past Surgical History:  Procedure Laterality Date  . CHOLECYSTECTOMY    . COLONOSCOPY WITH PROPOFOL N/A 05/14/2016   Procedure: COLONOSCOPY WITH PROPOFOL;  Surgeon: Lucilla Lame, MD;  Location: [facility];  Service: Endoscopy;  Laterality: N/A;  . ESOPHAGOGASTRODUODENOSCOPY (EGD) WITH PROPOFOL N/A 05/14/2016   Procedure: ESOPHAGOGASTRODUODENOSCOPY (EGD) WITH PROPOFOL;  Surgeon: Lucilla Lame, MD;  Location: [facility];  Service: Endoscopy;  Laterality: N/A;  . RESECTION DISTAL CLAVICAL Right 02/21/2014   Procedure: RESECTION DISTAL CLAVICAL;  Surgeon: Nita Sells, MD;  Location: [place];  Service: Orthopedics;  Laterality: Right;  . SHOULDER ARTHROSCOPY WITH SUBACROMIAL DECOMPRESSION Right 02/21/2014   Procedure: SHOULDER ARTHROSCOPY WITH SUBACROMIAL DECOMPRESSION, DISTAL CLAVICAL EXCISION, DEBRIDIMENT OF PARTIAL ROTATOR CUFF TEAR;  Surgeon: Nita Sells, MD;  Location: [place];   Service: Orthopedics;  Laterality: Right;  Right shoulder arthroscopy subacromial decompression, distal clavical excision    There were no vitals filed for this visit.      Subjective Assessment - 06/25/16 0847    Subjective Patinet reports the pain in his chest continues to improve but still has a tight band of tissue which aggravates him at the end of the day after working.    Pertinent History Pt reports that he was lifting a bunch of things around the house on February 5th and he noticed that he started having low back pain the following day. He does not recall a particular mechanism of injury. He describes the pain as "nerve" pain as well as "achy, sharp, and dull." "It feels like I'm getting shocked." Pt reports pain occurs sometimes unilaterally in low back, sometimes alternates between sides, and sometimes concurrently bilaterally. Pain radiates around the front of his abdomen (not through), to his shoulders, and down his arms. Pain occasionally radiates down both legs but pain primarily radiates down the RLE. He reports no change in pain as the day progresses. Pain occasionally wakes him up at night. Previously he was sleeping all night. Worst pain: 10/10, Best: 4/10, Present: 6/10. Pt initially denies history of any similar pain however at other times reports that he had comparable low back pain when he was first diagnosed with fibromyalgia in 2014. Pt reports loss of intermittent loss bladder control since his back pain started but not loss of bowel control. He reports that he disclosed this information to his physician. He also complains of intermittent numbness  in the R groin area, which he feels run down his right leg. Denies chills, fevers, or night sweats. Confirms weight loss of 22# since symptoms first began in [place]. Pt reports a prior history of bilateral shoulder and neck pain which feels better when his "vertebra in upper back slips back in." He reports that chiropractic manipulation  improves his shoulder pain, chest pain, and difficulty with swallowing. History of R SAD January 2016. Per patient report he had MRI of both cervical and thoracic spine without any findings. Since 03/11/16 he has had 4 ER visits, 4 PCP visits,  1 cardiology visit, 1 visit to spine specialist, and 1 GI visit. He saw GI due to dysphagia and per note plan is for upper endoscopy.    Limitations Walking   Diagnostic tests MRI of neck and thoracic spine: WNL   Patient Stated Goals Be able to work pain free and return to martial arts   Currently in Pain? No/denies      TREATMENT Manual Therapy Extensive STM to pec major and minor bilaterally; fanning and sweeping strokes utilized, focused on intercostals and area close to sternum;   Ther-ex Push ups from raised table - x 10 , x15 (Had to decrease AROM to decrease pain in the chest) OMEGA seated rows with 55# 2 x 10, pain-free; OMEGA chest press in normal range to av past neutral 55# 2 x 10, pain-free; Supine single arm chest fly into press for eccentric motion and muscle healing - 2 x 10 (Performed bilaterally) Chest fly with TRX straps - x 10 Patient demonstrates increased pain in the chest performing push up into full AROM. Had to decrease full AROM to improve symptoms       PT Education - 06/25/16 0849    Education provided Yes   Education Details Form/technique with exercise   Person(s) Educated Patient   Methods Explanation;Demonstration   Comprehension Verbalized understanding;Returned demonstration             PT Long Term Goals - 06/20/16 1444      PT LONG TERM GOAL #1   Title Pt will be independent with HEP in order to decrease pain and improve strength in order to improve pain-free function at home and work.   Baseline 06/20/16: Requires significant cueing on progression of exercises throughout pain free AROM   Time 6   Period Weeks   Status On-going     PT LONG TERM GOAL #2   Title Pt will decrease mODI scoreby at  least 13 points in order demonstrate clinically significant reduction in pain/disability    Baseline 04/30/16: 76%, 06/06/16: 8%   Time 6   Period Weeks   Status Achieved     PT LONG TERM GOAL #3   Title Pt will decrease worst low back pain to 8/10 or below with aggravating activities in order to improve pain-free function at home and work   Baseline 04/30/16: worst: 10/10; 06/06/08: worst: 4/10;   Time 6   Period Weeks   Status Achieved     PT LONG TERM GOAL #4   Title Patient will improve chest tightness demonstrating zero trigger points to better perform recreational exercises more succesfully.    Baseline Trigger points throughout R pectoralis Major   Time 4   Period Weeks   Status New               Plan - 06/25/16 0914    Clinical Impression Statement Patient demonstrates improvement in chest function today  versus previous treatments with ability to perform exercises without pain with greater resistance. Patient demonstrates less trigger points today verus previous treatments, indicating functional carryover between sessions. Although the patient is improving, he continues to demonstrate increased chest pain with higher level chest activities and patient will benefit from further skilled therapy to return to priror level of function.     Rehab Potential Fair   Clinical Impairments Affecting Rehab Potential Positives: Age, family support, Negatives: Job requirements, chronic pain, multiple physical complaints, psychosocial factors   PT Frequency 2x / week   PT Duration 6 weeks   PT Treatment/Interventions ADLs/Self Care Home Management;Therapeutic activities;Therapeutic exercise;Manual techniques;Passive range of motion;Aquatic Therapy;Electrical Stimulation;Cryotherapy;Iontophoresis 4mg /ml Dexamethasone;Moist Heat;Traction;Ultrasound;Gait training;Neuromuscular re-education;Patient/family education  No Dry Needling per MD order   PT Next Visit Plan Outcome measures, update goals  with patient, STM, progressive strengthening program for chest and back as pt can tolerate without increase in pain.    PT Home Exercise Plan Seated R piriformis stretch, thoracic extension and extension/rotation over foam roller, foam rolling or MF release ball/tennis ball for pec, prone on elbows multiple times/day x 5 minute at a time, Pec isometrics in horizont adduction, short range wall push-up,  Discontinue all rows and pec stretches, perform frequent icing, continue antiinflammatories as prescribed by PCP.   Consulted and Agree with Plan of Care Patient      Patient will benefit from skilled therapeutic intervention in order to improve the following deficits and impairments:  Postural dysfunction, Pain, Decreased range of motion, Decreased mobility, Decreased strength, Difficulty walking, Increased muscle spasms, Impaired sensation  Visit Diagnosis: Pain in thoracic spine  Acute bilateral low back pain with right-sided sciatica     Problem List Patient Active Problem List   Diagnosis Date Noted  . Depression, major, single episode, moderate ([place]) 05/31/2016  . Generalized anxiety disorder 05/31/2016  . Melena   . Rectal polyp   . Problems with swallowing and mastication   . 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 Hypertension 04/06/2013  . Allergic rhinitis 01/18/2013  . Family history of long QT syndrome 06/12/2012  . 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 Smoker 06/12/2012  . Common migraine 05/26/2012  . Fibromyalgia 05/26/2012    Blythe Stanford, PT DPT 06/25/2016, 9:39 AM  [facility] [address], [place], [address] Phone: [phone]   Fax:  [phone]  Name: Raymond [name] MRN: [id number] Date of Birth: 09/09/1975

## 2016-07-01 ENCOUNTER — Ambulatory Visit: Payer: BLUE CROSS/BLUE SHIELD

## 2016-07-01 DIAGNOSIS — M25511 Pain in right shoulder: Secondary | ICD-10-CM

## 2016-07-01 DIAGNOSIS — M25512 Pain in left shoulder: Secondary | ICD-10-CM | POA: Diagnosis not present

## 2016-07-01 DIAGNOSIS — M546 Pain in thoracic spine: Secondary | ICD-10-CM

## 2016-07-01 DIAGNOSIS — G8929 Other chronic pain: Secondary | ICD-10-CM

## 2016-07-01 DIAGNOSIS — M5441 Lumbago with sciatica, right side: Secondary | ICD-10-CM | POA: Diagnosis not present

## 2016-07-01 NOTE — Therapy (Signed)
[facility] [address] [place], [address] Phone: [phone]   Fax:  [phone]  Physical Therapy Treatment  Patient Details  Name: Raymond [name] MRN: [id number] Date of Birth: 28-Nov-1975 Referring Provider: Karenann Cai PA-C  Encounter Date: 07/01/2016      PT End of Session - 07/01/16 0916    Visit Number 14   Number of Visits 21   Date for PT Re-Evaluation 07/18/16   PT Start Time 0840   PT Stop Time 0925   PT Time Calculation (min) 45 min   Activity Tolerance Patient tolerated treatment well   Behavior During Therapy [facility] for tasks assessed/performed      Past Medical History:  Diagnosis Date  . Biceps tendonitis on right 01/2014  . History of gastric ulcer    summer 2015  . Hypertension    has not been taking his medication; advised to start back on med. today  . 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 Shoulder impingement 01/2014   right    Past Surgical History:  Procedure Laterality Date  . CHOLECYSTECTOMY    . COLONOSCOPY WITH PROPOFOL N/A 05/14/2016   Procedure: COLONOSCOPY WITH PROPOFOL;  Surgeon: Lucilla Lame, MD;  Location: [facility];  Service: Endoscopy;  Laterality: N/A;  . ESOPHAGOGASTRODUODENOSCOPY (EGD) WITH PROPOFOL N/A 05/14/2016   Procedure: ESOPHAGOGASTRODUODENOSCOPY (EGD) WITH PROPOFOL;  Surgeon: Lucilla Lame, MD;  Location: [facility];  Service: Endoscopy;  Laterality: N/A;  . RESECTION DISTAL CLAVICAL Right 02/21/2014   Procedure: RESECTION DISTAL CLAVICAL;  Surgeon: Nita Sells, MD;  Location: [place];  Service: Orthopedics;  Laterality: Right;  . SHOULDER ARTHROSCOPY WITH SUBACROMIAL DECOMPRESSION Right 02/21/2014   Procedure: SHOULDER ARTHROSCOPY WITH SUBACROMIAL DECOMPRESSION, DISTAL CLAVICAL EXCISION, DEBRIDIMENT OF PARTIAL ROTATOR CUFF TEAR;  Surgeon: Nita Sells, MD;  Location: [place];   Service: Orthopedics;  Laterality: Right;  Right shoulder arthroscopy subacromial decompression, distal clavical excision    There were no vitals filed for this visit.      Subjective Assessment - 07/01/16 0903    Subjective Patient reports his pain in his pec continues to improve and states his muscle spasm in his chest are improving.    Pertinent History Pt reports that he was lifting a bunch of things around the house on February 5th and he noticed that he started having low back pain the following day. He does not recall a particular mechanism of injury. He describes the pain as "nerve" pain as well as "achy, sharp, and dull." "It feels like I'm getting shocked." Pt reports pain occurs sometimes unilaterally in low back, sometimes alternates between sides, and sometimes concurrently bilaterally. Pain radiates around the front of his abdomen (not through), to his shoulders, and down his arms. Pain occasionally radiates down both legs but pain primarily radiates down the RLE. He reports no change in pain as the day progresses. Pain occasionally wakes him up at night. Previously he was sleeping all night. Worst pain: 10/10, Best: 4/10, Present: 6/10. Pt initially denies history of any similar pain however at other times reports that he had comparable low back pain when he was first diagnosed with fibromyalgia in 2014. Pt reports loss of intermittent loss bladder control since his back pain started but not loss of bowel control. He reports that he disclosed this information to his physician. He also complains of intermittent numbness in the R groin area, which he feels run  down his right leg. Denies chills, fevers, or night sweats. Confirms weight loss of 22# since symptoms first began in [place]. Pt reports a prior history of bilateral shoulder and neck pain which feels better when his "vertebra in upper back slips back in." He reports that chiropractic manipulation improves his shoulder pain, chest pain,  and difficulty with swallowing. History of R SAD January 2016. Per patient report he had MRI of both cervical and thoracic spine without any findings. Since 03/11/16 he has had 4 ER visits, 4 PCP visits,  1 cardiology visit, 1 visit to spine specialist, and 1 GI visit. He saw GI due to dysphagia and per note plan is for upper endoscopy.    Limitations Walking   Diagnostic tests MRI of neck and thoracic spine: WNL   Patient Stated Goals Be able to work pain free and return to martial arts   Currently in Pain? No/denies        TREATMENT Manual Therapy Extensive STM to pec major and minor bilaterally; fanning and sweeping strokes utilized, focused on intercostals and area close to sternum;   Ther-ex Push ups from raised table - x10 (Had to decrease AROM to decrease pain in the chest) Push ups from ground -  2 x 10  Triceps push ups from raised table - 2 x 10  Incline bench press - x10 with 10# weight B, unilaterally on R &L x10 #20 Supine single arm chest fly into press for eccentric motion and muscle healing - 2 x 10 (Performed bilaterally) Chest fly with TRX straps - 2  x 10  Patient responds well to therapy with no increase in pain throughout session        PT Education - 07/01/16 0915    Education provided Yes   Education Details Educated on Progression of exercise and technique   Person(s) Educated Patient   Methods Explanation;Demonstration   Comprehension Verbalized understanding;Returned demonstration             PT Long Term Goals - 06/20/16 1444      PT LONG TERM GOAL #1   Title Pt will be independent with HEP in order to decrease pain and improve strength in order to improve pain-free function at home and work.   Baseline 06/20/16: Requires significant cueing on progression of exercises throughout pain free AROM   Time 6   Period Weeks   Status On-going     PT LONG TERM GOAL #2   Title Pt will decrease mODI scoreby at least 13 points in order demonstrate  clinically significant reduction in pain/disability    Baseline 04/30/16: 76%, 06/06/16: 8%   Time 6   Period Weeks   Status Achieved     PT LONG TERM GOAL #3   Title Pt will decrease worst low back pain to 8/10 or below with aggravating activities in order to improve pain-free function at home and work   Baseline 04/30/16: worst: 10/10; 06/06/08: worst: 4/10;   Time 6   Period Weeks   Status Achieved     PT LONG TERM GOAL #4   Title Patient will improve chest tightness demonstrating zero trigger points to better perform recreational exercises more succesfully.    Baseline Trigger points throughout R pectoralis Major   Time 4   Period Weeks   Status New               Plan - 07/01/16 0917    Clinical Impression Statement Patient making progress towards long term  goals with decreased musckle spasms and trigger points throughout his pectoral musculature indicating functional carryover between visits. Patient demonstrates ability to perform full push up from standard position (Required body to be at an angle previous treatment) indicating improvement in musuclar strength. Patient will benefit from further skilled therapy focused on improving chest strength / coordination to return ro prior level of function.    Rehab Potential Fair   Clinical Impairments Affecting Rehab Potential Positives: Age, family support, Negatives: Job requirements, chronic pain, multiple physical complaints, psychosocial factors   PT Frequency 2x / week   PT Duration 6 weeks   PT Treatment/Interventions ADLs/Self Care Home Management;Therapeutic activities;Therapeutic exercise;Manual techniques;Passive range of motion;Aquatic Therapy;Electrical Stimulation;Cryotherapy;Iontophoresis 4mg /ml Dexamethasone;Moist Heat;Traction;Ultrasound;Gait training;Neuromuscular re-education;Patient/family education  No Dry Needling per MD order   PT Next Visit Plan Outcome measures, update goals with patient, STM, progressive  strengthening program for chest and back as pt can tolerate without increase in pain.    PT Home Exercise Plan Seated R piriformis stretch, thoracic extension and extension/rotation over foam roller, foam rolling or MF release ball/tennis ball for pec, prone on elbows multiple times/day x 5 minute at a time, Pec isometrics in horizont adduction, short range wall push-up,  Discontinue all rows and pec stretches, perform frequent icing, continue antiinflammatories as prescribed by PCP.   Consulted and Agree with Plan of Care Patient      Patient will benefit from skilled therapeutic intervention in order to improve the following deficits and impairments:  Postural dysfunction, Pain, Decreased range of motion, Decreased mobility, Decreased strength, Difficulty walking, Increased muscle spasms, Impaired sensation  Visit Diagnosis: Pain in thoracic spine  Chronic pain of both shoulders     Problem List Patient Active Problem List   Diagnosis Date Noted  . Depression, major, single episode, moderate ([place]) 05/31/2016  . Generalized anxiety disorder 05/31/2016  . Melena   . Rectal polyp   . Problems with swallowing and mastication   . 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 Hypertension 04/06/2013  . Allergic rhinitis 01/18/2013  . Family history of long QT syndrome 06/12/2012  . 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 Smoker 06/12/2012  . Common migraine 05/26/2012  . Fibromyalgia 05/26/2012    Blythe Stanford, PT DPT 07/01/2016, 9:29 AM  [facility] [address] [place], [address] Phone: [phone]   Fax:  [phone]  Name: Raymond [name] MRN: [id number] Date of Birth: 1975/11/27

## 2016-07-03 ENCOUNTER — Ambulatory Visit: Payer: BLUE CROSS/BLUE SHIELD

## 2016-07-03 DIAGNOSIS — M25512 Pain in left shoulder: Secondary | ICD-10-CM | POA: Diagnosis not present

## 2016-07-03 DIAGNOSIS — G8929 Other chronic pain: Secondary | ICD-10-CM

## 2016-07-03 DIAGNOSIS — M5441 Lumbago with sciatica, right side: Secondary | ICD-10-CM | POA: Diagnosis not present

## 2016-07-03 DIAGNOSIS — M546 Pain in thoracic spine: Secondary | ICD-10-CM

## 2016-07-03 DIAGNOSIS — M25511 Pain in right shoulder: Secondary | ICD-10-CM

## 2016-07-03 NOTE — Therapy (Signed)
[facility] [address] [place], [address] Phone: [phone]   Fax:  [phone]  Physical Therapy Treatment  Patient Details  Name: Raymond [name] MRN: [id number] Date of Birth: 01/13/1976 Referring Provider: Karenann Cai PA-C  Encounter Date: 07/03/2016      PT End of Session - 07/03/16 1423    Visit Number 15   Number of Visits 21   Date for PT Re-Evaluation 07/18/16   PT Start Time 7510   PT Stop Time 1430   PT Time Calculation (min) 44 min   Activity Tolerance Patient tolerated treatment well   Behavior During Therapy [facility] for tasks assessed/performed      Past Medical History:  Diagnosis Date  . Biceps tendonitis on right 01/2014  . History of gastric ulcer    summer 2015  . Hypertension    has not been taking his medication; advised to start back on med. today  . 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 Shoulder impingement 01/2014   right    Past Surgical History:  Procedure Laterality Date  . CHOLECYSTECTOMY    . COLONOSCOPY WITH PROPOFOL N/A 05/14/2016   Procedure: COLONOSCOPY WITH PROPOFOL;  Surgeon: Lucilla Lame, MD;  Location: [facility];  Service: Endoscopy;  Laterality: N/A;  . ESOPHAGOGASTRODUODENOSCOPY (EGD) WITH PROPOFOL N/A 05/14/2016   Procedure: ESOPHAGOGASTRODUODENOSCOPY (EGD) WITH PROPOFOL;  Surgeon: Lucilla Lame, MD;  Location: [facility];  Service: Endoscopy;  Laterality: N/A;  . RESECTION DISTAL CLAVICAL Right 02/21/2014   Procedure: RESECTION DISTAL CLAVICAL;  Surgeon: Nita Sells, MD;  Location: [place];  Service: Orthopedics;  Laterality: Right;  . SHOULDER ARTHROSCOPY WITH SUBACROMIAL DECOMPRESSION Right 02/21/2014   Procedure: SHOULDER ARTHROSCOPY WITH SUBACROMIAL DECOMPRESSION, DISTAL CLAVICAL EXCISION, DEBRIDIMENT OF PARTIAL ROTATOR CUFF TEAR;  Surgeon: Nita Sells, MD;  Location: [place];   Service: Orthopedics;  Laterality: Right;  Right shoulder arthroscopy subacromial decompression, distal clavical excision    There were no vitals filed for this visit.      Subjective Assessment - 07/03/16 1420    Subjective Patient reports he doesn't have pain in his pec just slight irratation at night when doing to bed.    Pertinent History Pt reports that he was lifting a bunch of things around the house on February 5th and he noticed that he started having low back pain the following day. He does not recall a particular mechanism of injury. He describes the pain as "nerve" pain as well as "achy, sharp, and dull." "It feels like I'm getting shocked." Pt reports pain occurs sometimes unilaterally in low back, sometimes alternates between sides, and sometimes concurrently bilaterally. Pain radiates around the front of his abdomen (not through), to his shoulders, and down his arms. Pain occasionally radiates down both legs but pain primarily radiates down the RLE. He reports no change in pain as the day progresses. Pain occasionally wakes him up at night. Previously he was sleeping all night. Worst pain: 10/10, Best: 4/10, Present: 6/10. Pt initially denies history of any similar pain however at other times reports that he had comparable low back pain when he was first diagnosed with fibromyalgia in 2014. Pt reports loss of intermittent loss bladder control since his back pain started but not loss of bowel control. He reports that he disclosed this information to his physician. He also complains of intermittent numbness in the R groin area, which he feels run down his  right leg. Denies chills, fevers, or night sweats. Confirms weight loss of 22# since symptoms first began in [place]. Pt reports a prior history of bilateral shoulder and neck pain which feels better when his "vertebra in upper back slips back in." He reports that chiropractic manipulation improves his shoulder pain, chest pain, and difficulty  with swallowing. History of R SAD January 2016. Per patient report he had MRI of both cervical and thoracic spine without any findings. Since 03/11/16 he has had 4 ER visits, 4 PCP visits,  1 cardiology visit, 1 visit to spine specialist, and 1 GI visit. He saw GI due to dysphagia and per note plan is for upper endoscopy.    Limitations Walking   Diagnostic tests MRI of neck and thoracic spine: WNL   Patient Stated Goals Be able to work pain free and return to martial arts   Currently in Pain? No/denies         TREATMENT Manual Therapy Extensive STM to pec major and minor bilaterally; fanning and sweeping strokes utilized, focused on intercostals and area close to sternum;   Ther-ex Weight shifts with Bosu on table - 2 x 10 B (tapping each side) Push ups on Bosu ball from table x10; 2 x 10 from ground Supine single arm chest fly into press for eccentric motion and muscle healing - 2 x 15 (Performed bilaterally) Supine unilateral Chest press with 40# kettlebell - 2 x 15   Patient responds well to therapy with slight increase in aggravation at end of repetition range         PT Education - 07/03/16 1422    Education provided Yes   Education Details form/technique with exercise   Person(s) Educated Patient   Methods Explanation;Demonstration   Comprehension Returned demonstration;Verbalized understanding             PT Long Term Goals - 06/20/16 1444      PT LONG TERM GOAL #1   Title Pt will be independent with HEP in order to decrease pain and improve strength in order to improve pain-free function at home and work.   Baseline 06/20/16: Requires significant cueing on progression of exercises throughout pain free AROM   Time 6   Period Weeks   Status On-going     PT LONG TERM GOAL #2   Title Pt will decrease mODI scoreby at least 13 points in order demonstrate clinically significant reduction in pain/disability    Baseline 04/30/16: 76%, 06/06/16: 8%   Time 6   Period  Weeks   Status Achieved     PT LONG TERM GOAL #3   Title Pt will decrease worst low back pain to 8/10 or below with aggravating activities in order to improve pain-free function at home and work   Baseline 04/30/16: worst: 10/10; 06/06/08: worst: 4/10;   Time 6   Period Weeks   Status Achieved     PT LONG TERM GOAL #4   Title Patient will improve chest tightness demonstrating zero trigger points to better perform recreational exercises more succesfully.    Baseline Trigger points throughout R pectoralis Major   Time 4   Period Weeks   Status New               Plan - 07/03/16 1424    Clinical Impression Statement Patient demonstrates deminished trigger points in the chest today during PT indicating functional carryover between visits. Continued to focuse on returning to gym activities and performing gym motions; patiient demonstrated increased  fatigue at the end of therapy but does not complain of any pain. Patient will benefit from further skilled therapy to return to prior level of function.    Rehab Potential Fair   Clinical Impairments Affecting Rehab Potential Positives: Age, family support, Negatives: Job requirements, chronic pain, multiple physical complaints, psychosocial factors   PT Frequency 2x / week   PT Duration 6 weeks   PT Treatment/Interventions ADLs/Self Care Home Management;Therapeutic activities;Therapeutic exercise;Manual techniques;Passive range of motion;Aquatic Therapy;Electrical Stimulation;Cryotherapy;Iontophoresis 4mg /ml Dexamethasone;Moist Heat;Traction;Ultrasound;Gait training;Neuromuscular re-education;Patient/family education  No Dry Needling per MD order   PT Next Visit Plan Outcome measures, update goals with patient, STM, progressive strengthening program for chest and back as pt can tolerate without increase in pain.    PT Home Exercise Plan Seated R piriformis stretch, thoracic extension and extension/rotation over foam roller, foam rolling or MF  release ball/tennis ball for pec, prone on elbows multiple times/day x 5 minute at a time, Pec isometrics in horizont adduction, short range wall push-up,  Discontinue all rows and pec stretches, perform frequent icing, continue antiinflammatories as prescribed by PCP.   Consulted and Agree with Plan of Care Patient      Patient will benefit from skilled therapeutic intervention in order to improve the following deficits and impairments:  Postural dysfunction, Pain, Decreased range of motion, Decreased mobility, Decreased strength, Difficulty walking, Increased muscle spasms, Impaired sensation  Visit Diagnosis: Pain in thoracic spine  Chronic pain of both shoulders     Problem List Patient Active Problem List   Diagnosis Date Noted  . Depression, major, single episode, moderate ([place]) 05/31/2016  . Generalized anxiety disorder 05/31/2016  . Melena   . Rectal polyp   . Problems with swallowing and mastication   . 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 Hypertension 04/06/2013  . Allergic rhinitis 01/18/2013  . Family history of long QT syndrome 06/12/2012  . 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 Smoker 06/12/2012  . Common migraine 05/26/2012  . Fibromyalgia 05/26/2012    Blythe Stanford, PT DPT 07/03/2016, 2:31 PM  [facility] [address], [place], [address] Phone: [phone]   Fax:  [phone]  Name: Raymond [name] MRN: [id number] Date of Birth: 09/29/1975

## 2016-07-08 ENCOUNTER — Ambulatory Visit: Payer: BLUE CROSS/BLUE SHIELD

## 2016-07-11 ENCOUNTER — Ambulatory Visit: Payer: BLUE CROSS/BLUE SHIELD

## 2016-07-11 DIAGNOSIS — M546 Pain in thoracic spine: Secondary | ICD-10-CM | POA: Diagnosis not present

## 2016-07-11 DIAGNOSIS — M25511 Pain in right shoulder: Secondary | ICD-10-CM | POA: Diagnosis not present

## 2016-07-11 DIAGNOSIS — G8929 Other chronic pain: Secondary | ICD-10-CM

## 2016-07-11 DIAGNOSIS — M25512 Pain in left shoulder: Secondary | ICD-10-CM

## 2016-07-11 DIAGNOSIS — M5441 Lumbago with sciatica, right side: Secondary | ICD-10-CM | POA: Diagnosis not present

## 2016-07-11 NOTE — Therapy (Signed)
[place] [facility] [address], [place], [address] Phone: [phone]   Fax:  [phone]  Physical Therapy Treatment  Patient Details  Name: Raymond [name] MRN: [id number] Date of Birth: Jan 11, 1976 Referring Provider: Karenann Cai PA-C  Encounter Date: 07/11/2016      PT End of Session - 07/11/16 1509    Visit Number 16   Number of Visits 21   Date for PT Re-Evaluation 07/18/16   PT Start Time 3300   PT Stop Time 1515   PT Time Calculation (min) 44 min   Activity Tolerance Patient tolerated treatment well   Behavior During Therapy [facility] for tasks assessed/performed      Past Medical History:  Diagnosis Date  . Biceps tendonitis on right 01/2014  . History of gastric ulcer    summer 2015  . Hypertension    has not been taking his medication; advised to start back on med. today  . 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 Shoulder impingement 01/2014   right    Past Surgical History:  Procedure Laterality Date  . CHOLECYSTECTOMY    . COLONOSCOPY WITH PROPOFOL N/A 05/14/2016   Procedure: COLONOSCOPY WITH PROPOFOL;  Surgeon: Lucilla Lame, MD;  Location: [facility] ENDOSCOPY;  Service: Endoscopy;  Laterality: N/A;  . ESOPHAGOGASTRODUODENOSCOPY (EGD) WITH PROPOFOL N/A 05/14/2016   Procedure: ESOPHAGOGASTRODUODENOSCOPY (EGD) WITH PROPOFOL;  Surgeon: Lucilla Lame, MD;  Location: [facility] ENDOSCOPY;  Service: Endoscopy;  Laterality: N/A;  . RESECTION DISTAL CLAVICAL Right 02/21/2014   Procedure: RESECTION DISTAL CLAVICAL;  Surgeon: Nita Sells, MD;  Location: [place];  Service: Orthopedics;  Laterality: Right;  . SHOULDER ARTHROSCOPY WITH SUBACROMIAL DECOMPRESSION Right 02/21/2014   Procedure: SHOULDER ARTHROSCOPY WITH SUBACROMIAL DECOMPRESSION, DISTAL CLAVICAL EXCISION, DEBRIDIMENT OF PARTIAL ROTATOR CUFF TEAR;  Surgeon: Nita Sells, MD;  Location: [place];   Service: Orthopedics;  Laterality: Right;  Right shoulder arthroscopy subacromial decompression, distal clavical excision    There were no vitals filed for this visit.      Subjective Assessment - 07/11/16 1506    Subjective Patient reports he continues to not have pain and states decreased pain when working throughout the day. Patient reports he is getting better.   Pertinent History Pt reports that he was lifting a bunch of things around the house on February 5th and he noticed that he started having low back pain the following day. He does not recall a particular mechanism of injury. He describes the pain as "nerve" pain as well as "achy, sharp, and dull." "It feels like I'm getting shocked." Pt reports pain occurs sometimes unilaterally in low back, sometimes alternates between sides, and sometimes concurrently bilaterally. Pain radiates around the front of his abdomen (not through), to his shoulders, and down his arms. Pain occasionally radiates down both legs but pain primarily radiates down the RLE. He reports no change in pain as the day progresses. Pain occasionally wakes him up at night. Previously he was sleeping all night. Worst pain: 10/10, Best: 4/10, Present: 6/10. Pt initially denies history of any similar pain however at other times reports that he had comparable low back pain when he was first diagnosed with fibromyalgia in 2014. Pt reports loss of intermittent loss bladder control since his back pain started but not loss of bowel control. He reports that he disclosed this information to his physician. He also complains of intermittent numbness in the R groin area, which he  feels run down his right leg. Denies chills, fevers, or night sweats. Confirms weight loss of 22# since symptoms first began in [place]. Pt reports a prior history of bilateral shoulder and neck pain which feels better when his "vertebra in upper back slips back in." He reports that chiropractic manipulation improves  his shoulder pain, chest pain, and difficulty with swallowing. History of R SAD January 2016. Per patient report he had MRI of both cervical and thoracic spine without any findings. Since 03/11/16 he has had 4 ER visits, 4 PCP visits,  1 cardiology visit, 1 visit to spine specialist, and 1 GI visit. He saw GI due to dysphagia and per note plan is for upper endoscopy.    Limitations Walking   Diagnostic tests MRI of neck and thoracic spine: WNL   Patient Stated Goals Be able to work pain free and return to martial arts   Currently in Pain? No/denies      TREATMENT Manual Therapy Extensive STM to pec major/minor and minor bilaterally; fanning and sweeping strokes utilized, focused on intercostals and area close to sternum;   Ther-ex Chest flys with TRX straps - 2 x 10 Chest Press at [facility] - 2 x 10 #75 Lat pull down in sitting with wide grip - 65# 2 x 10 Bicep Curls B at [place] - 15# 2 x 10  Scapular retraction Rows at [facility] 65# -- 2 x 10     Patient responds well to therapy without aggravation of pain throughout session.         PT Education - 07/11/16 1508    Education provided Yes   Education Details Form/technique with exercise    Person(s) Educated Patient   Methods Explanation;Demonstration   Comprehension Verbalized understanding;Returned demonstration             PT Long Term Goals - 06/20/16 1444      PT LONG TERM GOAL #1   Title Pt will be independent with HEP in order to decrease pain and improve strength in order to improve pain-free function at home and work.   Baseline 06/20/16: Requires significant cueing on progression of exercises throughout pain free AROM   Time 6   Period Weeks   Status On-going     PT LONG TERM GOAL #2   Title Pt will decrease mODI scoreby at least 13 points in order demonstrate clinically significant reduction in pain/disability    Baseline 04/30/16: 76%, 06/06/16: 8%   Time 6   Period Weeks   Status Achieved     PT LONG TERM GOAL  #3   Title Pt will decrease worst low back pain to 8/10 or below with aggravating activities in order to improve pain-free function at home and work   Baseline 04/30/16: worst: 10/10; 06/06/08: worst: 4/10;   Time 6   Period Weeks   Status Achieved     PT LONG TERM GOAL #4   Title Patient will improve chest tightness demonstrating zero trigger points to better perform recreational exercises more succesfully.    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 Patient demonstrate increased fatigue with exercise  but demonstrates no increase in pain throughout and patient will benefit from further skilled therapy focused on improving pectoralis strength and decreasing spasms to return to prior level of function.    Rehab Potential Fair   Clinical Impairments Affecting Rehab Potential Positives: Age, family support, Negatives: Job requirements, chronic pain, multiple physical complaints, psychosocial factors   PT Frequency 2x / week   PT Duration 6 weeks   PT Treatment/Interventions ADLs/Self Care Home Management;Therapeutic activities;Therapeutic exercise;Manual techniques;Passive range of motion;Aquatic Therapy;Electrical Stimulation;Cryotherapy;Iontophoresis 4mg /ml Dexamethasone;Moist Heat;Traction;Ultrasound;Gait training;Neuromuscular re-education;Patient/family education  No Dry Needling per MD order   PT Next Visit Plan Outcome measures, update goals with patient, STM, progressive strengthening program for chest and back as pt can tolerate without increase in pain.    PT Home Exercise Plan Seated R piriformis stretch, thoracic extension and extension/rotation over foam roller, foam  rolling or MF release ball/tennis ball for pec, prone on elbows multiple times/day x 5 minute at a time, Pec isometrics in horizont adduction, short range wall push-up,  Discontinue all rows and pec stretches, perform frequent icing, continue antiinflammatories as prescribed by PCP.   Consulted and Agree with Plan of Care Patient      Patient will benefit from skilled therapeutic intervention in order to improve the following deficits and impairments:  Postural dysfunction, Pain, Decreased range of motion, Decreased mobility, Decreased strength, Difficulty walking, Increased muscle spasms, Impaired sensation  Visit Diagnosis: Pain in thoracic spine  Chronic pain of both shoulders     Problem List Patient Active Problem List   Diagnosis Date Noted  . Depression, major, single episode, moderate ([place]) 05/31/2016  . Generalized anxiety disorder 05/31/2016  . Melena   . Rectal polyp   . Problems with swallowing and mastication   . 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 Hypertension 04/06/2013  . Allergic rhinitis 01/18/2013  . Family history of long QT syndrome 06/12/2012  . 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 Smoker 06/12/2012  . Common migraine 05/26/2012  . Fibromyalgia 05/26/2012    Blythe Stanford, PT DPT 07/11/2016, 3:17 PM  [place] [facility] [address], [place], [address] Phone: [phone]   Fax:  [phone]  Name: ELIJA MCCAMISH MRN: [id number] Date of Birth: February 03, 1976

## 2016-07-31 ENCOUNTER — Telehealth: Payer: Self-pay

## 2016-07-31 MED ORDER — KETOPROFEN ER 200 MG PO CP24: 200.0000 mg | ORAL_CAPSULE | Freq: Every day | ORAL | 5 refills | Status: DC

## 2016-07-31 NOTE — Telephone Encounter (Signed)
Raymond [name] [facility] signed) said having hard time getting ketoprofen 75 mg; can get the 50 mg or 200 mg strength and wanted to know what can do. I spoke with Summer at [facility] and she checked with the pharmacist is not sure why cannot get the 75 mg strength; when [facility] orders the ketoprofen 75 mg it does not come in with no explanation. Pt last found med at [facility] but Raymond [name] said it is getting very hard to find the 75 mg strength. Raymond [name] request cb.[facility].

## 2016-07-31 NOTE — Telephone Encounter (Signed)
Ketoprofen ER 200 mg [facility] [address] as instructed by Dr. Lorelei Pont.  Kenney Houseman (wife) notified that prescription has been sent to pharmacy and advised that this is an extended release capsule (24 hour version) and is not to be taken more than once a day.  Kenney Houseman states understanding.

## 2016-07-31 NOTE — Telephone Encounter (Signed)
Ketoprofen ER 200 mg, 1 po daily, 30, 5 ref  Make sure that they understand this is a 24 hour version and not to take it more than once a day.

## 2016-08-02 ENCOUNTER — Telehealth: Payer: Self-pay

## 2016-08-02 NOTE — Telephone Encounter (Signed)
[name] left v/m at 4:44 pm requesting prior auth for ketoprofen to ins co. [name] request cb.

## 2016-08-05 ENCOUNTER — Other Ambulatory Visit: Payer: Self-pay | Admitting: Family Medicine

## 2016-08-05 MED ORDER — ETODOLAC 500 MG PO TABS: 500.0000 mg | ORAL_TABLET | Freq: Two times a day (BID) | ORAL | 5 refills | Status: DC

## 2016-08-05 NOTE — Telephone Encounter (Signed)
Pt wants to speak with Butch Penny about substitution med for Ketoprofen since med cost too much. [name] request cb.

## 2016-08-05 NOTE — Telephone Encounter (Signed)
[name] notified that Dr. Lorelei Pont and sent in Rx for Lodine for Jahni to take one tablet twice daily.

## 2016-08-05 NOTE — Telephone Encounter (Signed)
[name] called the pharmacy and they said it would be $150 for the medication.  They can't afford the medication.  Can the medication be switched to another medication or lower the dose? Kenney Houseman can be reached at [phone].

## 2016-08-05 NOTE — Telephone Encounter (Signed)
PA completed on CoverMyMeds and approved.  [name] notified by telephone.

## 2016-08-05 NOTE — Telephone Encounter (Signed)
It is impossible for me to know his insurance formulary. They can always look up NSAIDS on his formulary.  [facility]od[facility] This is usually cheap.

## 2016-08-13 ENCOUNTER — Encounter: Payer: Self-pay | Admitting: Emergency Medicine

## 2016-08-13 ENCOUNTER — Emergency Department
Admission: EM | Admit: 2016-08-13 | Discharge: 2016-08-13 | Disposition: A | Discharge status: Home / Self Care | Payer: BLUE CROSS/BLUE SHIELD | Attending: Emergency Medicine | Admitting: Emergency Medicine

## 2016-08-13 DIAGNOSIS — Z79899 Other long term (current) drug therapy: Secondary | ICD-10-CM | POA: Diagnosis not present

## 2016-08-13 DIAGNOSIS — I10 Essential (primary) hypertension: Secondary | ICD-10-CM | POA: Diagnosis not present

## 2016-08-13 DIAGNOSIS — Z23 Encounter for immunization: Secondary | ICD-10-CM | POA: Diagnosis not present

## 2016-08-13 DIAGNOSIS — Z87891 Personal history of nicotine dependence: Secondary | ICD-10-CM | POA: Insufficient documentation

## 2016-08-13 DIAGNOSIS — S61011A Laceration without foreign body of right thumb without damage to nail, initial encounter: Secondary | ICD-10-CM | POA: Insufficient documentation

## 2016-08-13 DIAGNOSIS — Y939 Activity, unspecified: Secondary | ICD-10-CM | POA: Insufficient documentation

## 2016-08-13 DIAGNOSIS — W269XXA Contact with unspecified sharp object(s), initial encounter: Secondary | ICD-10-CM | POA: Insufficient documentation

## 2016-08-13 DIAGNOSIS — Y929 Unspecified place or not applicable: Secondary | ICD-10-CM | POA: Insufficient documentation

## 2016-08-13 DIAGNOSIS — Y999 Unspecified external cause status: Secondary | ICD-10-CM | POA: Insufficient documentation

## 2016-08-13 DIAGNOSIS — S6991XA Unspecified injury of right wrist, hand and finger(s), initial encounter: Secondary | ICD-10-CM | POA: Diagnosis present

## 2016-08-13 MED ORDER — NAPROXEN 500 MG PO TABS: 500.0000 mg | ORAL_TABLET | Freq: Two times a day (BID) | ORAL | Status: DC

## 2016-08-13 MED ORDER — LIDOCAINE HCL (PF) 1 % IJ SOLN
INTRAMUSCULAR | Status: AC
  Administered 2016-08-13: 5 mL
  Filled 2016-08-13: qty 5

## 2016-08-13 MED ORDER — TETANUS-DIPHTH-ACELL PERTUSSIS 5-2.5-18.5 LF-MCG/0.5 IM SUSP
0.5000 mL | Freq: Once | INTRAMUSCULAR | Status: AC
  Administered 2016-08-13: 0.5 mL via INTRAMUSCULAR
  Filled 2016-08-13: qty 0.5

## 2016-08-13 MED ORDER — OXYCODONE HCL 5 MG PO TABS
5.0000 mg | ORAL_TABLET | Freq: Once | ORAL | Status: AC
  Administered 2016-08-13: 5 mg via ORAL
  Filled 2016-08-13: qty 1

## 2016-08-13 NOTE — ED Notes (Signed)
See triage note  States he cut his thumb on a piece of metal   Small u shaped laceration noted to right thumb

## 2016-08-13 NOTE — ED Notes (Signed)
Adaptic/conform dressing applied to right thumb

## 2016-08-13 NOTE — ED Triage Notes (Signed)
Pt reports cut right thumb with piece of metal today. Dressed on arrival to triage.  Undressed. U shaped laceration to right thumb, bleeding noted when dressing removed.

## 2016-08-13 NOTE — ED Provider Notes (Signed)
[facility] Emergency Department Provider Note   ____________________________________________   First MD Initiated Contact with Patient 08/13/16 1614     (approximate)  I have reviewed the triage vital signs and the nursing notes.   HISTORY Chief Complaint Laceration    HPI Raymond [name] is a 41 y.o. male patient complaining of lacerations to the right thumb cut piece of metal today. Patient state bleeding is controlled direct pressure. Patient denies loss sensation or loss of function of the affected digit. Patient last tetanus shot is not up-to-date.Rates his pain as a 5/10.   Past Medical History:  Diagnosis Date  . Biceps tendonitis on right 01/2014  . History of gastric ulcer    summer 2015  . Hypertension    has not been taking his medication; advised to start back on med. today  . 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 Shoulder impingement 01/2014   right    Patient Active Problem List   Diagnosis Date Noted  . Depression, major, single episode, moderate ([place]) 05/31/2016  . Generalized anxiety disorder 05/31/2016  . Melena   . Rectal polyp   . Problems with swallowing and mastication   . 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 Hypertension 04/06/2013  . Allergic rhinitis 01/18/2013  . Family history of long QT syndrome 06/12/2012  . 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 Smoker 06/12/2012  . Common migraine 05/26/2012  . Fibromyalgia 05/26/2012    Past Surgical History:  Procedure Laterality Date  . CHOLECYSTECTOMY    . COLONOSCOPY WITH PROPOFOL N/A 05/14/2016   Procedure: COLONOSCOPY WITH PROPOFOL;  Surgeon: Lucilla Lame, MD;  Location: [facility] END[facility]COPY;  Service: Endoscopy;  Laterality: N/A;  . ESOPHAGOGASTRODUODENOSCOPY (EGD) WITH PROPOFOL N/A 05/14/2016   Procedure: ESOPHAGOGASTRODUODENOSCOPY (EGD) WITH PROPOFOL;  Surgeon: Lucilla Lame, MD;  Location: [facility]OSCOPY;   Service: Endoscopy;  Laterality: N/A;  . RESECTION DISTAL CLAVICAL Right 02/21/2014   Procedure: RESECTION DISTAL CLAVICAL;  Surgeon: Nita Sells, MD;  Location: [place];  Service: Orthopedics;  Laterality: Right;  . SHOULDER ARTHROSCOPY WITH SUBACROMIAL DECOMPRESSION Right 02/21/2014   Procedure: SHOULDER ARTHROSCOPY WITH SUBACROMIAL DECOMPRESSION, DISTAL CLAVICAL EXCISION, DEBRIDIMENT OF PARTIAL ROTATOR CUFF TEAR;  Surgeon: Nita Sells, MD;  Location: [place];  Service: Orthopedics;  Laterality: Right;  Right shoulder arthroscopy subacromial decompression, distal clavical excision    Prior to Admission medications   Medication Sig Start Date End Date Taking? Authorizing Provider  amphetamine-dextroamphetamine (ADDERALL) 15 MG tablet Take 1 tablet by mouth 2 (two) times daily with a meal. 12/13/15   Copland, Frederico Hamman, MD  cetirizine-pseudoephedrine (ZYRTEC-D) 5-120 MG per tablet Take 1 tablet by mouth once a week.     [provider]  Cholecalciferol (VITAMIN D PO) Take 5,000 mg by mouth daily.    [provider]  cyclobenzaprine (FLEXERIL) 10 MG tablet Take 1 tablet (10 mg total) by mouth at bedtime. 05/03/16   Copland, Frederico Hamman, MD  escitalopram (LEXAPRO) 10 MG tablet Take 1 tablet (10 mg total) by mouth daily. 06/20/16   Copland, Frederico Hamman, MD  etodolac (LODINE) 500 MG tablet Take 1 tablet (500 mg total) by mouth 2 (two) times daily. 08/05/16   Copland, Frederico Hamman, MD  fluticasone (FLONASE) 50 MCG/ACT nasal spray Place 2 sprays into both nostrils daily. 05/30/16   Copland, Frederico Hamman, MD  hydrochlorothiazide (MICROZIDE) 12.5 MG capsule Take 1 capsule (12.5 mg total) by mouth daily. 06/03/16   Copland, Frederico Hamman,  MD  lidocaine (LIDODERM) 5 % Place 1 patch onto the skin daily. Remove & Discard patch within 12 hours or as directed by MD 04/05/16   Jinny Sanders, MD  naproxen (NAPROSYN) 500 MG tablet Take 1 tablet (500 mg total) by mouth 2  (two) times daily with a meal. 08/13/16   Sable Feil, PA-C  omeprazole (PRILOSEC) 20 MG capsule Take 20 mg by mouth daily.    [provider]  predniSONE (DELTASONE) 5 MG tablet 6 DAYS TAPER . TAKE AS DIRECTED WITH FOOD 05/28/16   [provider]  zolpidem (AMBIEN CR) 12.5 MG CR tablet Take 1 tablet (12.5 mg total) by mouth at bedtime as needed for sleep. 06/05/16   [name], MD    Allergies Azithromycin; Percocet [oxycodone-acetaminophen]; Vicodin [hydrocodone-acetaminophen]; and Adhesive [tape]  Family History  Problem Relation Age of Onset  . Diabetes Mother   . Heart disease Mother   . Long QT syndrome Mother   . Lung disease Father   . Diabetes Father     Social History Social History  Substance Use Topics  . Smoking status: Former Smoker    Quit date: 09/24/2013  . Smokeless tobacco: Never Used  . Alcohol use No    Review of Systems  Constitutional: No fever/chills Eyes: No visual changes. ENT: No sore throat. Cardiovascular: Denies chest pain. Respiratory: Denies shortness of breath. Gastrointestinal: No abdominal pain.  No nausea, no vomiting.  No diarrhea.  No constipation. Genitourinary: Negative for dysuria. Musculoskeletal: Negative for back pain. Skin: Negative for rash.Laceration left thumb.  Neurological: Negative for headaches, focal weakness or numbness. Psychiatric:Anxiety and an ADHD Endocrine:Hypertension  Hematological/Lymphatic: Allergic/Immunilogical: See extensive med list ____________________________________________   PHYSICAL EXAM:  VITAL SIGNS: ED Triage Vitals  Enc Vitals Group     BP 08/13/16 1547 115/75     Pulse Rate 08/13/16 1547 91     Resp 08/13/16 1547 18     Temp 08/13/16 1547 98.3 F (36.8 C)     Temp Source 08/13/16 1547 Oral     SpO2 08/13/16 1547 96 %     Weight 08/13/16 1547 170 lb (77.1 kg)     Height 08/13/16 1547 5\' 8"  (1.727 m)     Head Circumference --      Peak Flow --      Pain  Score 08/13/16 1556 5     Pain Loc --      Pain Edu? --      Excl. in [place]? --     Constitutional: Alert and oriented. Well appearing and in no acute distress. Cardiovascular: Normal rate, regular rhythm. Grossly normal heart sounds.  Good peripheral circulation. Respiratory: Normal respiratory effort.  No retractions. Lungs CTAB. Gastrointestinal: Soft and nontender. No distention. No abdominal bruits. No CVA tenderness. Musculoskeletal: No lower extremity tenderness nor edema.  No joint effusions. Neurologic:  Normal speech and language. No gross focal neurologic deficits are appreciated. No gait instability. Skin:  Skin is warm, dry and intact. No rash noted.Laceration volar aspect the right thumb Psychiatric: Mood and affect are normal. Speech and behavior are normal.  ____________________________________________   LABS (all labs ordered are listed, but only abnormal results are displayed)  Labs Reviewed - No data to display ____________________________________________  EKG   ____________________________________________  RADIOLOGY  No results found.  ____________________________________________   PROCEDURES  Procedure(s) performed: LACERATION REPAIR Performed by: Sable Feil Authorized by: Sable Feil Consent: Verbal consent obtained. Risks and benefits: risks, benefits and  alternatives were discussed Consent given by: patient Patient identity confirmed: provided demographic data Prepped and Draped in normal sterile fashion Wound explored  Laceration Location: Right thumb  Laceration Length: 1.5 cm No Foreign Bodies seen or palpated Anesthesia: Digital block Local anesthetic: lidocaine 1% without Anesthetic total: 5 ml Irrigation method: syringe Amount of cleaning: standard Skin closure: 3-0 nylon Number of sutures: 10 Technique: Interrupted Patient tolerance: Patient tolerated the procedure well with no immediate  complications.   Procedures  Critical Care performed: No  ____________________________________________   INITIAL IMPRESSION / ASSESSMENT AND PLAN / ED COURSE  Pertinent labs & imaging results that were available during my care of the patient were reviewed by me and considered in my medical decision making (see chart for details).  Right thumb laceration. Patient given discharge care instructions. Patient advised to have sutures removed in 10 days.    ____________________________________________   FINAL CLINICAL IMPRESSION(S) / ED DIAGNOSES  Final diagnoses:  Laceration of right thumb without foreign body without damage to nail, initial encounter      NEW MEDICATIONS STARTED DURING THIS VISIT:  New Prescriptions   NAPROXEN (NAPROSYN) 500 MG TABLET    Take 1 tablet (500 mg total) by mouth 2 (two) times daily with a meal.     Note:  This document was prepared using Dragon voice recognition software and may include unintentional dictation errors.    Sable Feil, PA-C 08/13/16 1704    Hinda Kehr, MD 08/13/16 2007

## 2016-08-23 ENCOUNTER — Ambulatory Visit (INDEPENDENT_AMBULATORY_CARE_PROVIDER_SITE_OTHER): Payer: BLUE CROSS/BLUE SHIELD | Admitting: Family Medicine

## 2016-08-23 ENCOUNTER — Encounter: Payer: Self-pay | Admitting: Family Medicine

## 2016-08-23 DIAGNOSIS — I10 Essential (primary) hypertension: Secondary | ICD-10-CM | POA: Diagnosis not present

## 2016-08-23 DIAGNOSIS — S61011D Laceration without foreign body of right thumb without damage to nail, subsequent encounter: Secondary | ICD-10-CM | POA: Insufficient documentation

## 2016-08-23 NOTE — Progress Notes (Signed)
   Subjective:    Patient ID: Raymond [name], male    DOB: 1975/06/19, 41 y.o.   MRN: [id number]  HPI   41 year old male  with HTN presents for ER follow up.  He was seen in ER on 6/26 for  1.5 laceration of right thumb.. Injury due to metal. Repaired with 3-0 nylon sutures x 10, interrupted.  He reports wound  has improved, minimal pain.  Minmal redness. No increased warmth or discharge.  occ bleeding.  No fever.  Some difficulty with movement.  He has some numbness above wound  Today BP is elevated.  He is on HCTZ for BP BP at time of injury was 115/75.   Review of Systems  Constitutional: Negative for fatigue.  Respiratory: Negative for shortness of breath.   Cardiovascular: Negative for chest pain.       Objective:   Physical Exam  Constitutional: He appears well-developed.  Cardiovascular: Normal rate, regular rhythm and normal heart sounds.   No murmur heard.   V shape laceration , right thumb base healing well, minimal surrounding erythema.   Procedure: 9 sutures removed ( 2 had already fallen out) and steri strips placed without event. No bleeding.  Wound edge well approximated and held in place with steri strips      Assessment & Plan:

## 2016-08-23 NOTE — Assessment & Plan Note (Signed)
Usually well controlled on HCTZ. Follow BP at home.. Call PCP if persistently elevated.

## 2016-08-23 NOTE — Patient Instructions (Addendum)
Follow BP at home.. If it persistently remain elevated.. Call Dr. Lorelei Pont. Keep hand clean and dry, wash with warm soapy water daily.  Steri strips will fall off on their own.

## 2016-09-26 ENCOUNTER — Other Ambulatory Visit: Payer: Self-pay | Admitting: Family Medicine

## 2016-09-27 NOTE — Telephone Encounter (Signed)
Last office visit 08/23/2016 with Dr. Diona Browner for thumb laceration.  Last refilled 06/05/2016 for #30 with 3 refills.  Ok to refill?

## 2016-09-29 NOTE — Telephone Encounter (Signed)
Ok to refill #30, 3 refills

## 2016-10-03 ENCOUNTER — Encounter: Payer: Self-pay | Admitting: Family Medicine

## 2016-10-04 MED ORDER — ESCITALOPRAM OXALATE 10 MG PO TABS: 10.0000 mg | ORAL_TABLET | Freq: Every day | ORAL | 1 refills | Status: DC

## 2016-10-04 NOTE — Telephone Encounter (Signed)
I could not tell if the Ambien had really been called in on 09-30-16 so I called it in this morning on the vm. I will let Dr Lorelei Pont decide how many refills of Lexapro to give him.

## 2016-10-04 NOTE — Telephone Encounter (Signed)
It was not documented that the rx was called in so I have left it on vm at the pharmacy

## 2016-10-04 NOTE — Telephone Encounter (Signed)
My suspicion is that this was not actually done. Butch Penny has been out of town this week, and multiple different CMA's have been working with me.

## 2016-11-05 ENCOUNTER — Encounter: Payer: Self-pay | Admitting: Family Medicine

## 2016-11-06 ENCOUNTER — Encounter: Payer: Self-pay | Admitting: Family Medicine

## 2016-11-06 NOTE — Telephone Encounter (Signed)
Viagra is not on current medication list.  Ok to refill?

## 2016-11-08 ENCOUNTER — Other Ambulatory Visit: Payer: Self-pay

## 2016-11-08 ENCOUNTER — Other Ambulatory Visit: Payer: Self-pay | Admitting: Family Medicine

## 2016-11-08 MED ORDER — SILDENAFIL CITRATE 100 MG PO TABS: 50.0000 mg | ORAL_TABLET | Freq: Every day | ORAL | 11 refills | Status: DC | PRN

## 2016-11-08 NOTE — Telephone Encounter (Signed)
Yes, this is fine. viagra 100 mg, 1/2 - 1 tab 30 mins before intercourse, #30, 11 ref  I can't tell which pharmacy he normally uses.

## 2016-11-08 NOTE — Progress Notes (Signed)
Yes, this is fine. viagra 100 mg, 1/2 - 1 tab 30 mins before intercourse, #30, 11 ref  I can't tell which pharmacy he normally uses.

## 2016-11-11 MED ORDER — SILDENAFIL CITRATE 100 MG PO TABS: 100.0000 mg | ORAL_TABLET | ORAL | 11 refills | Status: DC | PRN

## 2016-12-06 DIAGNOSIS — J019 Acute sinusitis, unspecified: Secondary | ICD-10-CM | POA: Diagnosis not present

## 2016-12-06 DIAGNOSIS — B9689 Other specified bacterial agents as the cause of diseases classified elsewhere: Secondary | ICD-10-CM | POA: Diagnosis not present

## 2016-12-06 DIAGNOSIS — J302 Other seasonal allergic rhinitis: Secondary | ICD-10-CM | POA: Diagnosis not present

## 2016-12-12 DIAGNOSIS — J019 Acute sinusitis, unspecified: Secondary | ICD-10-CM | POA: Diagnosis not present

## 2016-12-12 DIAGNOSIS — B9689 Other specified bacterial agents as the cause of diseases classified elsewhere: Secondary | ICD-10-CM | POA: Diagnosis not present

## 2016-12-25 ENCOUNTER — Other Ambulatory Visit: Payer: Self-pay | Admitting: Family Medicine

## 2017-03-07 DIAGNOSIS — B9689 Other specified bacterial agents as the cause of diseases classified elsewhere: Secondary | ICD-10-CM | POA: Diagnosis not present

## 2017-03-07 DIAGNOSIS — J019 Acute sinusitis, unspecified: Secondary | ICD-10-CM | POA: Diagnosis not present

## 2017-03-12 ENCOUNTER — Other Ambulatory Visit: Payer: Self-pay | Admitting: Family Medicine

## 2017-03-12 NOTE — Telephone Encounter (Signed)
Last office visit 08/23/2016.  Last refilled 08/05/2016 for #60 with 5 refills.  Ok to refill?

## 2017-03-27 ENCOUNTER — Other Ambulatory Visit: Payer: Self-pay | Admitting: Family Medicine

## 2017-03-27 MED ORDER — AMPHETAMINE-DEXTROAMPHETAMINE 15 MG PO TABS: 15.0000 mg | ORAL_TABLET | Freq: Two times a day (BID) | ORAL | 0 refills | Status: DC

## 2017-03-27 NOTE — Telephone Encounter (Signed)
Last office visit 08/23/2016 with Dr. Diona Browner for thumb laceration.  Last refilled 12/13/2015 for #60 with no refills.  Ok to refill?

## 2017-05-03 ENCOUNTER — Other Ambulatory Visit: Payer: Self-pay | Admitting: Family Medicine

## 2017-05-03 IMAGING — MR MR CERVICAL SPINE W/O CM
7 of 10 series · 37 of 48 positions shown · non-contrast
Comparison: MRI cervical spine 04/25/2011.

CLINICAL DATA: Neck, shoulder and rib cage pain. Lightheadedness.
Symptoms began 03/25/2016. No recent injury.

EXAM:
MRI THORACIC AND LUMBAR SPINE WITHOUT CONTRAST
TECHNIQUE: Multiplanar and multiecho pulse sequences of the cervical spine, to
include the craniocervical junction and cervicothoracic junction,
and thoracic and lumbar spine, were obtained without intravenous
contrast.

[Series 3: T2 · sagittal · 3.0mm · 0.82mm/px · 3 of 12 slices shown (1 of 6)]
[im 1/12]
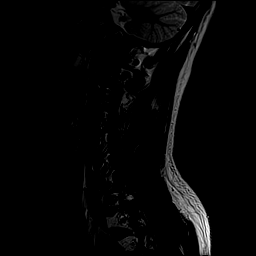
[im 6/12]
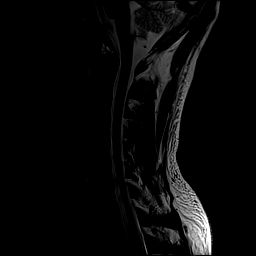
[im 12/12]
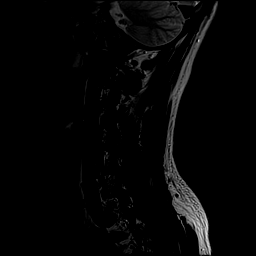

[Series 4: T1 · sagittal · 3.0mm · 0.82mm/px · 3 of 12 slices shown]
[im 1/12]
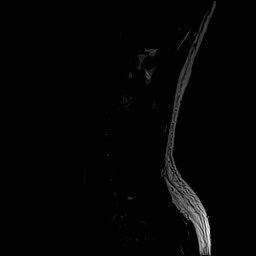
[im 6/12]
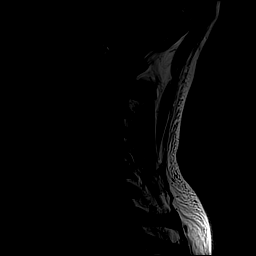
[im 12/12]
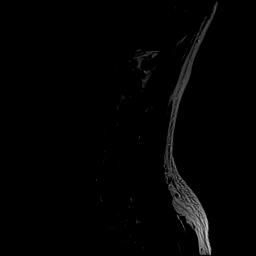

[Series 6: T2 · axial · 3.5mm · 0.74mm/px · z∈[-43,+57]mm · 6 of 24 slices shown (2 of 6)]
[im 1/24]
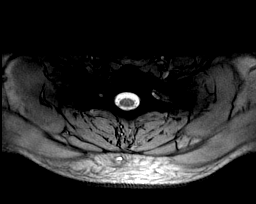
[im 5/24]
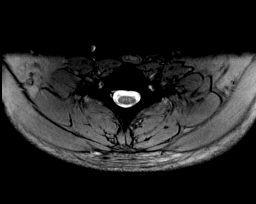
[im 10/24]
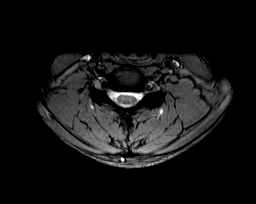
[im 14/24]
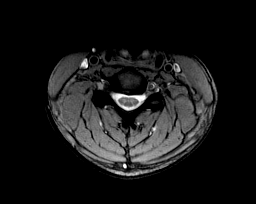
[im 19/24]
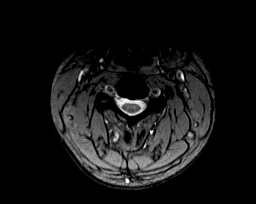
[im 24/24]
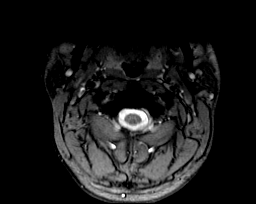

[Series 7: T2 · axial · 3.5mm · 0.74mm/px · z∈[-43,+57]mm · 6 of 24 slices shown (3 of 6)]
[im 1/24]
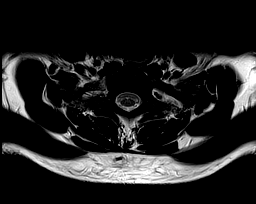
[im 5/24]
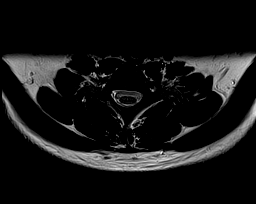
[im 10/24]
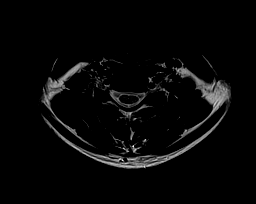
[im 14/24]
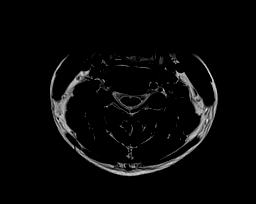
[im 19/24]
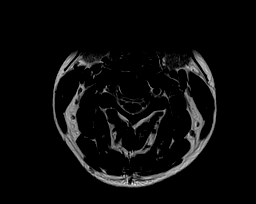
[im 24/24]
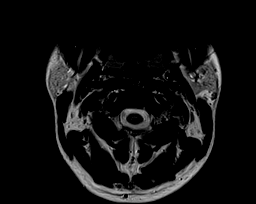

[Series 11: T2 · sagittal · 4.0mm · 0.40mm/px · 3 of 12 slices shown (4 of 6)]
[im 1/12]
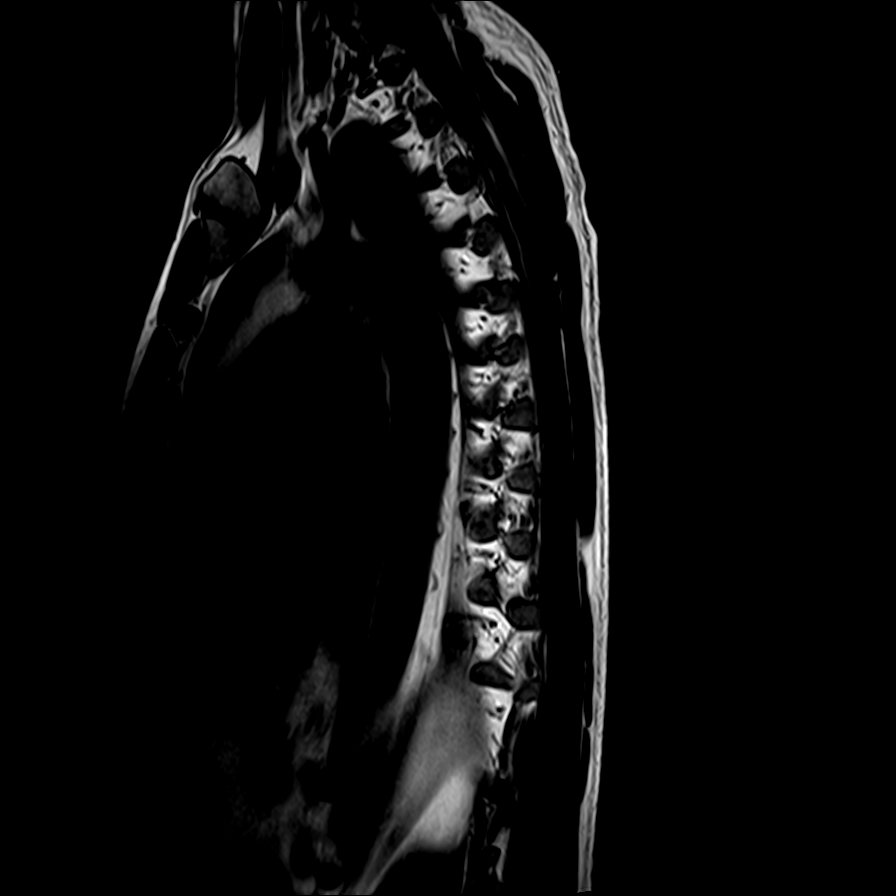
[im 6/12]
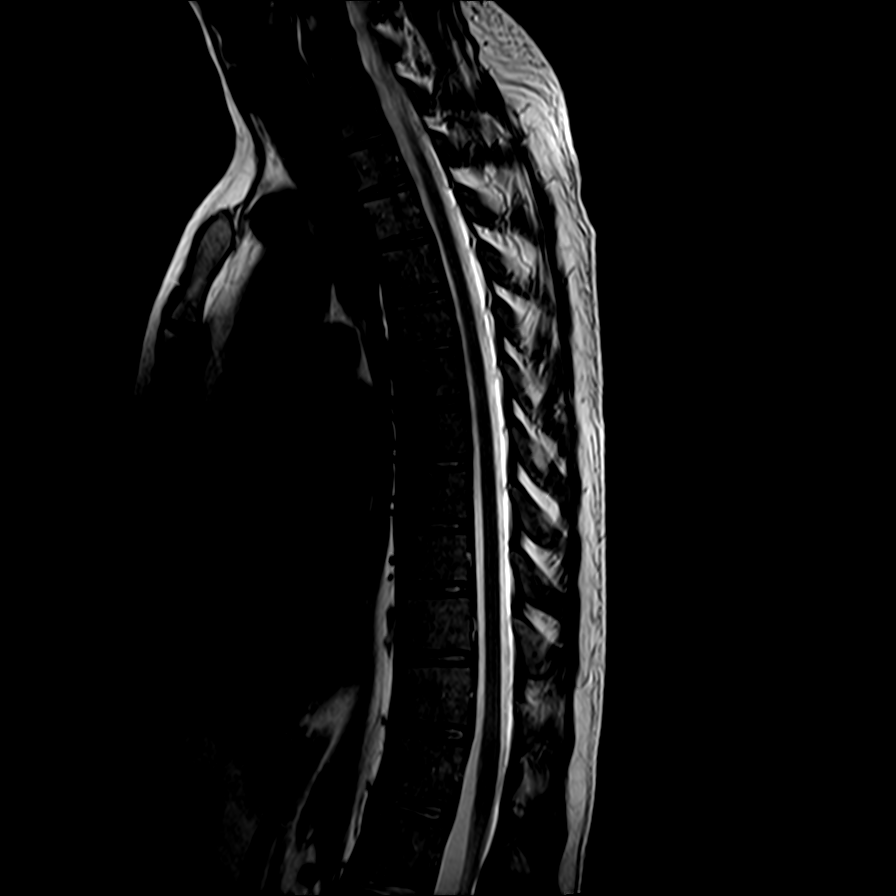
[im 12/12]
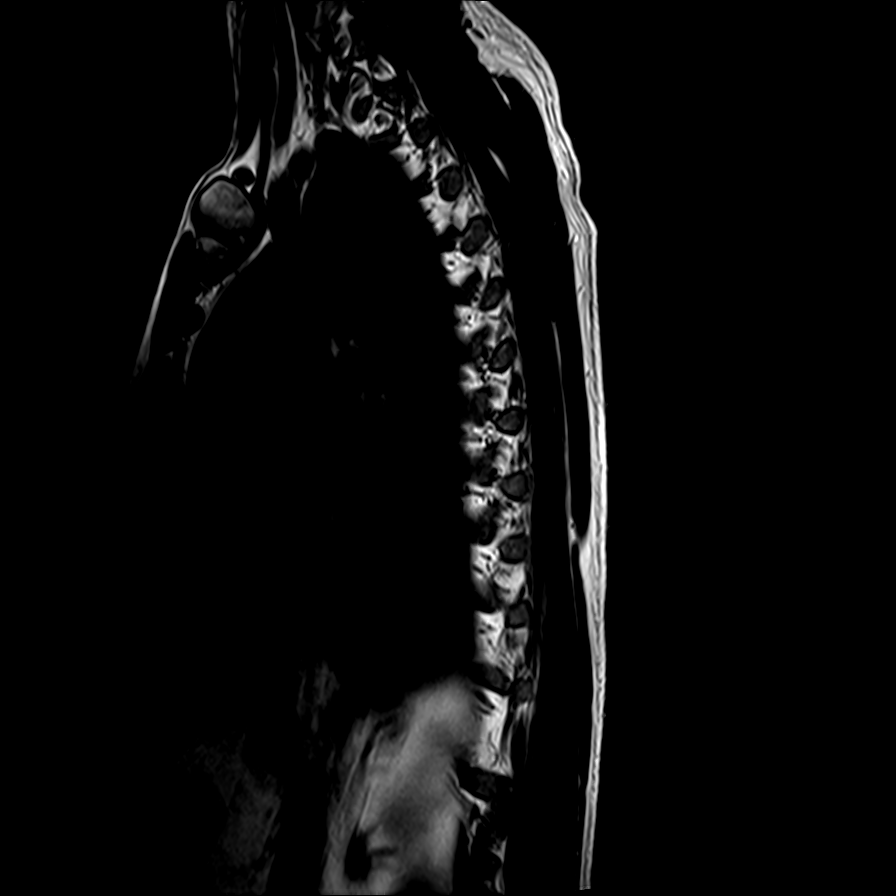

[Series 14: T2 · axial · 4.0mm · 0.78mm/px · z∈[-302,-62]mm · 8 of 36 slices shown (5 of 6)]
[im 1/36]
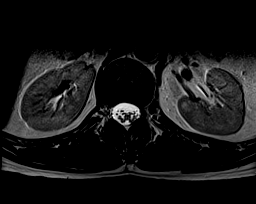
[im 5/36]
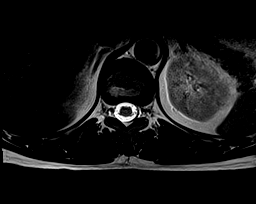
[im 9/36]
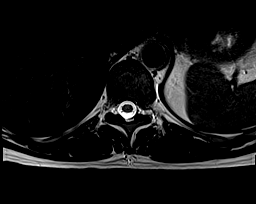
[im 14/36]
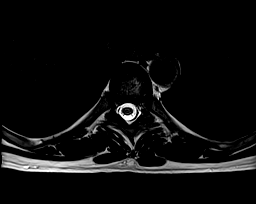
[im 22/36]
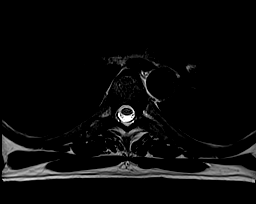
[im 27/36]
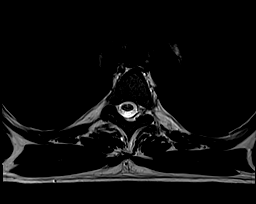
[im 31/36]
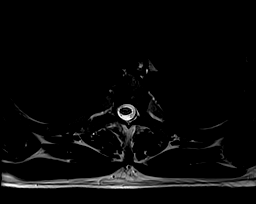
[im 36/36]
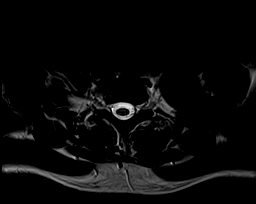

[Series 15: T2 · axial · 4.0mm · 0.78mm/px · z∈[-302,-62]mm · 8 of 36 slices shown (6 of 6)]
[im 1/36]
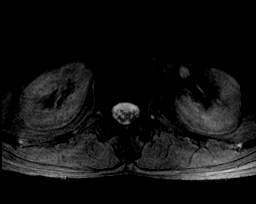
[im 5/36]
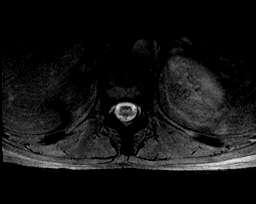
[im 9/36]
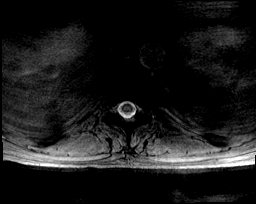
[im 14/36]
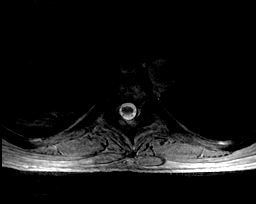
[im 22/36]
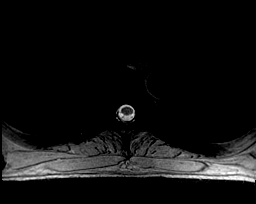
[im 27/36]
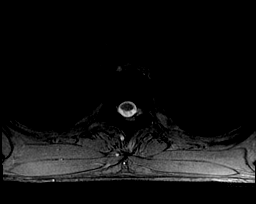
[im 31/36]
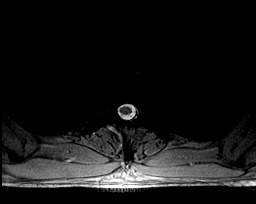
[im 36/36]
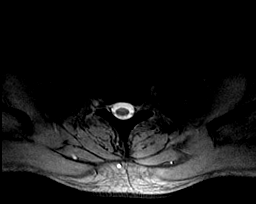

[37 of 48 positions shown; findings below may reference images not displayed]

FINDINGS: MRI CERVICAL SPINE FINDINGS

Alignment: Maintained.

Vertebrae: Height and signal are normal.

Cord: Normal signal throughout.

Posterior Fossa, vertebral arteries, paraspinal tissues: Negative.

Disc levels:

C2-3:  Negative.

C3-4: Tiny central protrusion without central canal or foraminal
narrowing, unchanged.

C4-5: Minimal disc bulge without central canal or foraminal
stenosis.

C5-6: Slight uncovertebral spurring on the left. The central canal
and foramina are widely patent.

C6-7: Minimal uncovertebral spurring on the left. The central canal
and foramina are widely patent.

C7-T1: Negative.

MRI THORACIC SPINE FINDINGS

Alignment:  Normal.

Vertebrae: Height and signal are normal throughout.

Cord:  Normal signal throughout.

Paraspinal and other soft tissues: Normal.

Disc levels:

Intervertebral disc space height is maintained at all levels. The
central canal and foramina are widely patent at all levels.
IMPRESSION: No change in minimal spondylosis of the cervical spine without
central canal or foraminal narrowing.

Normal thoracic spine MRI.

## 2017-05-05 NOTE — Telephone Encounter (Signed)
Last office visit 05/30/2016.  Last refilled 09/30/2016 for #30 with 1 refill.  Ok to refill?

## 2017-05-07 ENCOUNTER — Emergency Department (HOSPITAL_COMMUNITY)
Admission: EM | Admit: 2017-05-07 | Discharge: 2017-05-08 | Disposition: A | Discharge status: Other Acute Inpatient Hospital | Payer: BLUE CROSS/BLUE SHIELD | Attending: Emergency Medicine | Admitting: Emergency Medicine

## 2017-05-07 ENCOUNTER — Encounter (HOSPITAL_COMMUNITY): Payer: Self-pay | Admitting: *Deleted

## 2017-05-07 DIAGNOSIS — F332 Major depressive disorder, recurrent severe without psychotic features: Secondary | ICD-10-CM | POA: Diagnosis present

## 2017-05-07 DIAGNOSIS — F329 Major depressive disorder, single episode, unspecified: Secondary | ICD-10-CM | POA: Diagnosis not present

## 2017-05-07 DIAGNOSIS — R45851 Suicidal ideations: Secondary | ICD-10-CM

## 2017-05-07 DIAGNOSIS — R9431 Abnormal electrocardiogram [ECG] [EKG]: Secondary | ICD-10-CM | POA: Insufficient documentation

## 2017-05-07 DIAGNOSIS — Z79899 Other long term (current) drug therapy: Secondary | ICD-10-CM | POA: Diagnosis not present

## 2017-05-07 DIAGNOSIS — F10229 Alcohol dependence with intoxication, unspecified: Secondary | ICD-10-CM | POA: Diagnosis not present

## 2017-05-07 DIAGNOSIS — I10 Essential (primary) hypertension: Secondary | ICD-10-CM | POA: Insufficient documentation

## 2017-05-07 DIAGNOSIS — X838XXA Intentional self-harm by other specified means, initial encounter: Secondary | ICD-10-CM | POA: Diagnosis not present

## 2017-05-07 DIAGNOSIS — Z87891 Personal history of nicotine dependence: Secondary | ICD-10-CM | POA: Diagnosis not present

## 2017-05-07 DIAGNOSIS — Y904 Blood alcohol level of 80-99 mg/100 ml: Secondary | ICD-10-CM | POA: Diagnosis not present

## 2017-05-07 DIAGNOSIS — T1491XA Suicide attempt, initial encounter: Secondary | ICD-10-CM | POA: Diagnosis not present

## 2017-05-07 DIAGNOSIS — F1010 Alcohol abuse, uncomplicated: Secondary | ICD-10-CM

## 2017-05-07 LAB — SALICYLATE LEVEL: Salicylate Lvl: <7 mg/dL (ref 2.8–30.0)

## 2017-05-07 LAB — RAPID URINE DRUG SCREEN, HOSP PERFORMED
Amphetamines: NOT DETECTED
Barbiturates: NOT DETECTED
Benzodiazepines: NOT DETECTED
Cocaine: NOT DETECTED
Opiates: NOT DETECTED
Tetrahydrocannabinol: NOT DETECTED

## 2017-05-07 LAB — CBC
HCT: 52.4 % — ABNORMAL HIGH (ref 39.0–52.0)
Hemoglobin: 18.8 g/dL — ABNORMAL HIGH (ref 13.0–17.0)
MCH: 33.3 pg (ref 26.0–34.0)
MCHC: 35.9 g/dL (ref 30.0–36.0)
MCV: 92.7 fL (ref 78.0–100.0)
Platelets: 228 10*3/uL (ref 150–400)
RBC: 5.65 MIL/uL (ref 4.22–5.81)
RDW: 13.6 % (ref 11.5–15.5)
WBC: 10 10*3/uL (ref 4.0–10.5)

## 2017-05-07 LAB — COMPREHENSIVE METABOLIC PANEL
ALT: 53 U/L (ref 17–63)
AST: 30 U/L (ref 15–41)
Albumin: 4 g/dL (ref 3.5–5.0)
Alkaline Phosphatase: 76 U/L (ref 38–126)
Anion gap: 17 — ABNORMAL HIGH (ref 5–15)
BUN: 11 mg/dL (ref 6–20)
CO2: 21 mmol/L — ABNORMAL LOW (ref 22–32)
Calcium: 8.7 mg/dL — ABNORMAL LOW (ref 8.9–10.3)
Chloride: 103 mmol/L (ref 101–111)
Creatinine, Ser: 1.02 mg/dL (ref 0.61–1.24)
GFR calc Af Amer: >60 mL/min (ref >60)
GFR calc non Af Amer: >60 mL/min (ref >60)
Glucose, Bld: 125 mg/dL — ABNORMAL HIGH (ref 65–99)
Potassium: 3.2 mmol/L — ABNORMAL LOW (ref 3.5–5.1)
Sodium: 141 mmol/L (ref 135–145)
Total Bilirubin: 0.6 mg/dL (ref 0.3–1.2)
Total Protein: 7.3 g/dL (ref 6.5–8.1)

## 2017-05-07 LAB — LIPASE, BLOOD: Lipase: 44 U/L (ref 11–51)

## 2017-05-07 LAB — ETHANOL: Alcohol, Ethyl (B): 99 mg/dL — ABNORMAL HIGH (ref <10)

## 2017-05-07 LAB — ACETAMINOPHEN LEVEL: Acetaminophen (Tylenol), Serum: <10 ug/mL — ABNORMAL LOW (ref 10–30)

## 2017-05-07 MED ORDER — LORAZEPAM 2 MG/ML IJ SOLN: 0.0000 mg | Freq: Four times a day (QID) | INTRAMUSCULAR | Status: DC

## 2017-05-07 MED ORDER — LORAZEPAM 1 MG PO TABS
0.0000 mg | ORAL_TABLET | Freq: Four times a day (QID) | ORAL | Status: DC
  Administered 2017-05-08: 1 mg via ORAL
  Filled 2017-05-07: qty 1

## 2017-05-07 MED ORDER — THIAMINE HCL 100 MG/ML IJ SOLN
100.0000 mg | Freq: Every day | INTRAMUSCULAR | Status: DC
  Administered 2017-05-07: 100 mg via INTRAVENOUS
  Filled 2017-05-07: qty 2

## 2017-05-07 MED ORDER — POTASSIUM CHLORIDE CRYS ER 20 MEQ PO TBCR
40.0000 meq | EXTENDED_RELEASE_TABLET | Freq: Once | ORAL | Status: AC
  Administered 2017-05-07: 40 meq via ORAL
  Filled 2017-05-07: qty 2

## 2017-05-07 MED ORDER — LORAZEPAM 1 MG PO TABS: 0.0000 mg | ORAL_TABLET | Freq: Two times a day (BID) | ORAL | Status: DC

## 2017-05-07 MED ORDER — LORAZEPAM 2 MG/ML IJ SOLN: 0.0000 mg | Freq: Two times a day (BID) | INTRAMUSCULAR | Status: DC

## 2017-05-07 MED ORDER — SODIUM CHLORIDE 0.9 % IV BOLUS (SEPSIS)
1000.0000 mL | Freq: Once | INTRAVENOUS | Status: AC
  Administered 2017-05-07: 1000 mL via INTRAVENOUS

## 2017-05-07 MED ORDER — VITAMIN B-1 100 MG PO TABS
100.0000 mg | ORAL_TABLET | Freq: Every day | ORAL | Status: DC
  Administered 2017-05-08: 100 mg via ORAL
  Filled 2017-05-07: qty 1

## 2017-05-07 NOTE — ED Provider Notes (Signed)
[facility] DEPT Provider Note   CSN: [id number] Arrival date & time: 05/07/17  1538     History   Chief Complaint Chief Complaint  Patient presents with  . IVC  . Suicidal    HPI Raymond [name] is a 42 y.o. male.  HPI Patient being seen here and his wife is present.  Patient's wife reports he has been drinking heavily for the past couple of weeks.  This started several months ago.  Patient did not previously have history of alcoholism.  Patient agrees this is accurate.  Now has been drinking at least 1/5 of liquor every night for a couple of weeks.  Patient denies he is ever had alcohol withdrawal or is familiar with what that even is.  He denies any other drug abuse. Patient has been out of work recently and extremely depressed.  Family have been very concerned for suicidal gestures.  Patient reports that they were worried because he did have a gun out a few days ago.  His wife reports is now walked in a safe.  She also reports that yesterday she found him with a belt around his neck in his room.  He was attempting to hang himself.  Patient denies that prior to this episode of depression and suicidal gestures, he ever had prior suicide attempts.  He also denies prior alcohol or drug use.  Patient's wife reports he started to spend a lot of time with some of the "wrong people".  He is also been home and out of work which is been a stressor. Past Medical History:  Diagnosis Date  . Biceps tendonitis on right 01/2014  . History of gastric ulcer    summer 2015  . Hypertension    has not been taking his medication; advised to start back on med. today  . 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 Shoulder impingement 01/2014   right    Patient Active Problem List   Diagnosis Date Noted  . Thumb laceration, right, subsequent encounter 08/23/2016  . Depression, major, single episode, moderate ([place]) 05/31/2016  . Generalized anxiety disorder  05/31/2016  . Melena   . Rectal polyp   . Problems with swallowing and mastication   . 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 HTN (hypertension) 04/06/2013  . Allergic rhinitis 01/18/2013  . Family history of long QT syndrome 06/12/2012  . 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 Smoker 06/12/2012  . Common migraine 05/26/2012  . Fibromyalgia 05/26/2012    Past Surgical History:  Procedure Laterality Date  . CHOLECYSTECTOMY    . COLONOSCOPY WITH PROPOFOL N/A 05/14/2016   Procedure: COLONOSCOPY WITH PROPOFOL;  Surgeon: Lucilla Lame, MD;  Location: [facility] ENDOSCOPY;  Service: Endoscopy;  Laterality: N/A;  . ESOPHAGOGASTRODUODENOSCOPY (EGD) WITH PROPOFOL N/A 05/14/2016   Procedure: ESOPHAGOGASTRODUODENOSCOPY (EGD) WITH PROPOFOL;  Surgeon: Lucilla Lame, MD;  Location: [facility] ENDOSCOPY;  Service: Endoscopy;  Laterality: N/A;  . RESECTION DISTAL CLAVICAL Right 02/21/2014   Procedure: RESECTION DISTAL CLAVICAL;  Surgeon: Nita Sells, MD;  Location: [place];  Service: Orthopedics;  Laterality: Right;  . SHOULDER ARTHROSCOPY WITH SUBACROMIAL DECOMPRESSION Right 02/21/2014   Procedure: SHOULDER ARTHROSCOPY WITH SUBACROMIAL DECOMPRESSION, DISTAL CLAVICAL EXCISION, DEBRIDIMENT OF PARTIAL ROTATOR CUFF TEAR;  Surgeon: Nita Sells, MD;  Location: [place];  Service: Orthopedics;  Laterality: Right;  Right shoulder arthroscopy subacromial decompression, distal clavical excision       Home Medications  Prior to Admission medications   Medication Sig Start Date End Date Taking? Authorizing Provider  Cholecalciferol (VITAMIN D PO) Take 5,000 mg by mouth daily.   Yes [provider]  hydrochlorothiazide (MICROZIDE) 12.5 MG capsule TAKE ONE CAPSULE BY MOUTH EVERY DAY 12/26/16  Yes Copland, Frederico Hamman, MD  zolpidem (AMBIEN CR) 12.5 MG CR tablet TAKE 1 TABLET BY MOUTH NIGHTLY AS NEEDED Patient  taking differently: TAKE 1 TABLET BY MOUTH NIGHTLY AS NEEDED SLEEP 05/05/17  Yes Copland, Frederico Hamman, MD  amphetamine-dextroamphetamine (ADDERALL) 15 MG tablet Take 1 tablet by mouth 2 (two) times daily with a meal. Patient taking differently: Take 15 mg by mouth 2 (two) times daily as needed (adhd).  03/27/17   Copland, Frederico Hamman, MD  cetirizine-pseudoephedrine (ZYRTEC-D) 5-120 MG per tablet Take 1 tablet by mouth daily as needed for allergies.     [provider]  cyclobenzaprine (FLEXERIL) 10 MG tablet Take 1 tablet (10 mg total) by mouth at bedtime. Patient not taking: Reported on 05/07/2017 05/03/16   Copland, Frederico Hamman, MD  escitalopram (LEXAPRO) 10 MG tablet Take 1 tablet (10 mg total) by mouth daily. Patient taking differently: Take 10 mg by mouth daily as needed (depression).  10/04/16   Copland, Frederico Hamman, MD  etodolac (LODINE) 500 MG tablet TAKE 1 TABLET BY MOUTH TWICE A DAY Patient taking differently: TAKE 1 TABLET BY MOUTH TWICE A DAY PRN PAIN 03/12/17   Copland, Frederico Hamman, MD  fluticasone (FLONASE) 50 MCG/ACT nasal spray Place 2 sprays into both nostrils daily. Patient taking differently: Place 2 sprays into both nostrils daily as needed for allergies.  05/30/16   Copland, Frederico Hamman, MD  lidocaine (LIDODERM) 5 % Place 1 patch onto the skin daily. Remove & Discard patch within 12 hours or as directed by MD Patient not taking: Reported on 05/07/2017 04/05/16   Jinny Sanders, MD  sildenafil (VIAGRA) 100 MG tablet Take 1 tablet (100 mg total) by mouth as needed for erectile dysfunction. 11/11/16   Copland, Frederico Hamman, MD  sildenafil (VIAGRA) 100 MG tablet Take 0.5-1 tablets (50-100 mg total) by mouth daily as needed for erectile dysfunction. Patient not taking: Reported on 05/07/2017 11/08/16   [name], MD    Family History Family History  Problem Relation Age of Onset  . Diabetes Mother   . Heart disease Mother   . Long QT syndrome Mother   . Lung disease Father   . Diabetes Father      Social History Social History   Tobacco Use  . Smoking status: Former Smoker    Last attempt to quit: 09/24/2013    Years since quitting: 3.6  . Smokeless tobacco: Never Used  Substance Use Topics  . Alcohol use: Yes    Alcohol/week: 0.0 oz    Comment: 1/5 daily  . Drug use: No     Allergies   Azithromycin; Percocet [oxycodone-acetaminophen]; Vicodin [hydrocodone-acetaminophen]; and Adhesive [tape]   Review of Systems Review of Systems 10 Systems reviewed and are negative for acute change except as noted in the HPI.   Physical Exam Updated Vital Signs BP 125/79 (BP Location: Left Arm)   Pulse 93   Temp 98.6 F (37 C) (Oral)   Resp 16   SpO2 96%   Physical Exam  Constitutional: He is oriented to person, place, and time. He appears well-developed and well-nourished. No distress.  Patient is up and ambulatory in the room.  He has been in the bathroom and washed his hands in coordinated fashion.  He does still  have a vaguely intoxicated appearance to him and smell slightly of alcohol.  Well-groomed.  Basically good physical condition.  HENT:  Head: Normocephalic and atraumatic.  Nose: Nose normal.  Mouth/Throat: Oropharynx is clear and moist.  Eyes: Conjunctivae and EOM are normal.  Neck: Neck supple. No thyromegaly present.  Cardiovascular: Normal rate, regular rhythm, normal heart sounds and intact distal pulses.  No murmur heard. Pulmonary/Chest: Effort normal and breath sounds normal. No respiratory distress.  Abdominal: Soft. He exhibits no distension and no mass. There is no tenderness. There is no guarding.  Musculoskeletal: Normal range of motion. He exhibits no edema, tenderness or deformity.  Patient pointed out a place on the sole of his right foot that causes him pain.  Faint ecchymotic discoloration in the sole at the arch.  No surrounding erythema or swelling.  Patient does not seem particularly tender to palpation.  Left foot completely normal.  Lower  extremities normal without injuries or edema.  bilateral upper extremities examined no areas of injuries or injection sites.  Neurological: He is alert and oriented to person, place, and time. No cranial nerve deficit. He exhibits normal muscle tone. Coordination normal.  Skin: Skin is warm and dry.  Psychiatric: He has a normal mood and affect.  At this time patient is cooperative and interactive.  Nursing note and vitals reviewed.    ED Treatments / Results  Labs (all labs ordered are listed, but only abnormal results are displayed) Labs Reviewed  COMPREHENSIVE METABOLIC PANEL - Abnormal; Notable for the following components:      Result Value   Potassium 3.2 (*)    CO2 21 (*)    Glucose, Bld 125 (*)    Calcium 8.7 (*)    Anion gap 17 (*)    All other components within normal limits  ETHANOL - Abnormal; Notable for the following components:   Alcohol, Ethyl (B) 99 (*)    All other components within normal limits  ACETAMINOPHEN LEVEL - Abnormal; Notable for the following components:   Acetaminophen (Tylenol), Serum <10 (*)    All other components within normal limits  CBC - Abnormal; Notable for the following components:   Hemoglobin 18.8 (*)    HCT 52.4 (*)    All other components within normal limits  SALICYLATE LEVEL  RAPID URINE DRUG SCREEN, HOSP PERFORMED  LIPASE, BLOOD    EKG  EKG Interpretation  Date/Time:  Wednesday May 07 2017 17:40:03 EDT Ventricular Rate:  89 PR Interval:  128 QRS Duration: 86 QT Interval:  380 QTC Calculation: 462 R Axis:   85 Text Interpretation:  Normal sinus rhythm no change from previous Confirmed by Charlesetta Shanks [phone]) on 05/07/2017 8:04:34 PM       Radiology No results found.  Procedures Procedures (including critical care time)  Medications Ordered in ED Medications  LORazepam (ATIVAN) injection 0-4 mg (0 mg Intravenous Hold 05/07/17 1717)    Or  LORazepam (ATIVAN) tablet 0-4 mg ( Oral See Alternative 05/07/17 1717)    LORazepam (ATIVAN) injection 0-4 mg (not administered)    Or  LORazepam (ATIVAN) tablet 0-4 mg (not administered)  thiamine (VITAMIN B-1) tablet 100 mg ( Oral See Alternative 05/07/17 1726)    Or  thiamine (B-1) injection 100 mg (100 mg Intravenous Given 05/07/17 1726)  sodium chloride 0.9 % bolus 1,000 mL (0 mLs Intravenous Stopped 05/07/17 2023)  potassium chloride SA (K-DUR,KLOR-CON) CR tablet 40 mEq (40 mEq Oral Given 05/07/17 1950)     Initial Impression /  Assessment and Plan / ED Course  I have reviewed the triage vital signs and the nursing notes.  Pertinent labs & imaging results that were available during my care of the patient were reviewed by me and considered in my medical decision making (see chart for details).      Final Clinical Impressions(s) / ED Diagnoses   Final diagnoses:  Suicidal ideation  Alcohol abuse  Prolonged QT interval   Patient has had continuous alcohol use now for several months.  He has not had prior history of alcohol withdrawal.  He still has alcohol in his system.  Will initiate CIWA protocol.  Patient's mental status is clear.  He does not show signs of confusion, agitation or hallucination.  Patient has had 2 significant gestures of suicidal intent.  He otherwise does not have significant medical problems. Incidentally, EKG does show slightly prolonged QT.  Will be cautious with any use of prolonging QT agents.  At this time, patient does not need further medications besides as needed Ativan.  Patient is medically cleared for psychiatric admission and treatment. ED Discharge Orders    None       Charlesetta Shanks, MD 05/07/17 [phone]

## 2017-05-07 NOTE — ED Triage Notes (Signed)
Pt's family states the pt has been suicidal and last had a suicide attempt 2 days ago. Pt's wife states she has caught the pt with a belt around his neck. Wife states the pt has been drinking and taking sleeping pills. Pt has not been to his job in a week and family is concerned he will kill himself. Wife states the pt has been on lexapro but is not sure if he is still taking his medicine.

## 2017-05-07 NOTE — BH Assessment (Signed)
Assessment Note  Raymond [name] is a 42 y.o. male who presented to [facility] on voluntary basis with complaint of suicidal ideation, depressive symptoms, and alcohol use.  Pt was accompanied by his wife Raymond [name].  Pt has not been assessed by TTS before.  Pt lives in [place] with his wife and three children.  Pt reported that he has been depressed and anxious since the death of his father 05/27/15).  Per Pt reported that he received psychiatric treatment for a provider at [facility], but he cannot recall the name of the psychiatrist and therapist.  Pt reported that he takes Lexapro for depression and anxiety, now prescribed by a PCP.  Pt and wife reported that recently, Pt has had conflict with wife, and this has exacerbated his depression.  Wife stated that on Sunday, Pt attempted to hang himself with a belt, and when that was not successful, Pt started looking for the firearm in the house.  Pt endorsed the following symptoms:  Suicidal ideation with plan and intent; persistent and unremitting despondency; insomnia; poor appetite; feelings of worthlessness; vegetative disturbance (not leaving bed).  Pt denied homicidal ideation, self-injurious behavior, and auditory/visual hallucination.  Pt endorsed daily use of alcohol, stating that he ingests over a 1/5 per day.  Pt acknowledged that he wants treatment.  During assessment, Pt presented as alert and oriented.  He had fair eye contact and was cooperative in session.  Pt was resting, dressed in scrubs, and appeared somewhat groggy.  Demeanor was sluggish.  Pt's mood was depressed.  Affect was mood congruent.  Pt admitted to attempting suicide on 05/04/17 by hanging and then looking for a gun.  He endorsed significant depressive symptoms.  Pt also endorsed daily use of alcohol (over a 1/5 per day).  Pt's speech was slow and soft.  Thought processes were slow, but normal in content.  There was no evidence of delusion.  Pt's memory and concentration were fair.  Pt's  insight, judgment, and impulse control were deemed poor.  Consulted with Thedora Hinders, NP, who determined that Pt meets inpatient criteria.   Diagnosis: 32.2 Major Depressive Disorder, Single Ep., Severe w/o psychotic features; Alcohol Use Disorder, Severe, Dependence  Past Medical History:  Past Medical History:  Diagnosis Date  . Biceps tendonitis on right 01/2014  . History of gastric ulcer    summer 2015  . Hypertension    has not been taking his medication; advised to start back on med. today  . 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 Shoulder impingement 01/2014   right    Past Surgical History:  Procedure Laterality Date  . CHOLECYSTECTOMY    . COLONOSCOPY WITH PROPOFOL N/A 05/14/2016   Procedure: COLONOSCOPY WITH PROPOFOL;  Surgeon: Lucilla Lame, MD;  Location: [facility] ENDOSCOPY;  Service: Endoscopy;  Laterality: N/A;  . ESOPHAGOGASTRODUODENOSCOPY (EGD) WITH PROPOFOL N/A 05/14/2016   Procedure: ESOPHAGOGASTRODUODENOSCOPY (EGD) WITH PROPOFOL;  Surgeon: Lucilla Lame, MD;  Location: [facility] ENDOSCOPY;  Service: Endoscopy;  Laterality: N/A;  . RESECTION DISTAL CLAVICAL Right 02/21/2014   Procedure: RESECTION DISTAL CLAVICAL;  Surgeon: Nita Sells, MD;  Location: [place];  Service: Orthopedics;  Laterality: Right;  . SHOULDER ARTHROSCOPY WITH SUBACROMIAL DECOMPRESSION Right 02/21/2014   Procedure: SHOULDER ARTHROSCOPY WITH SUBACROMIAL DECOMPRESSION, DISTAL CLAVICAL EXCISION, DEBRIDIMENT OF PARTIAL ROTATOR CUFF TEAR;  Surgeon: Nita Sells, MD;  Location: [place];  Service: Orthopedics;  Laterality: Right;  Right shoulder arthroscopy subacromial decompression, distal clavical excision  Family History:  Family History  Problem Relation Age of Onset  . Diabetes Mother   . Heart disease Mother   . Long QT syndrome Mother   . Lung disease Father   . Diabetes Father     Social History:  reports that he quit smoking about 3 years  ago. he has never used smokeless tobacco. He reports that he drinks alcohol. He reports that he does not use drugs.  Additional Social History:  Alcohol / Drug Use Pain Medications: See MAR Prescriptions: See MAR Over the Counter: See MAR History of alcohol / drug use?: Yes Substance #1 Name of Substance 1: Alcohol 1 - Amount (size/oz): over 1/5 1 - Frequency: Daily 1 - Duration: Ongoing 1 - Last Use / Amount: 05/07/17(BAC not available)  CIWA: CIWA-Ar BP: 134/78 Pulse Rate: 97 Nausea and Vomiting: no nausea and no vomiting Tactile Disturbances: none Tremor: no tremor Auditory Disturbances: not present Paroxysmal Sweats: three Visual Disturbances: not present Anxiety: no anxiety, at ease Headache, Fullness in Head: none present Agitation: normal activity Orientation and Clouding of Sensorium: oriented and can do serial additions CIWA-Ar Total: 3 COWS:    Allergies:  Allergies  Allergen Reactions  . Azithromycin Nausea And Vomiting  . Percocet [Oxycodone-Acetaminophen] Hives  . Vicodin [Hydrocodone-Acetaminophen] Hives  . Adhesive [Tape] Other (See Comments)    SKIN IRRITATION    Home Medications:  (Not in a hospital admission)  OB/GYN Status:  No LMP for male patient.  General Assessment Data Location of Assessment: [facility] TTS Assessment: In system Is this a Tele or Face-to-Face Assessment?: Face-to-Face Is this an Initial Assessment or a Re-assessment for this encounter?: Initial Assessment Marital status: Married Living Arrangements: Spouse/significant other, Children([name], three children) Can pt return to current living arrangement?: Yes Admission Status: Voluntary Is patient capable of signing voluntary admission?: Yes Referral Source: Self/Family/Friend Insurance type: [place] Living Arrangements: Spouse/significant other, Children([name], three children) Legal Guardian: Other:(None) Name of Psychiatrist: None  currently Name of Therapist: None currently  Education Status Is patient currently in school?: No Is the patient employed, unemployed or receiving disability?: Employed  Risk to self with the past 6 months Suicidal Ideation: Yes-Currently Present Has patient been a risk to self within the past 6 months prior to admission? : No Suicidal Intent: Yes-Currently Present Has patient had any suicidal intent within the past 6 months prior to admission? : No Is patient at risk for suicide?: Yes Suicidal Plan?: Yes-Currently Present Has patient had any suicidal plan within the past 6 months prior to admission? : No Specify Current Suicidal Plan: Pt tried to locate gun; tried to hang self w/belt Access to Means: Yes Specify Access to Suicidal Means: Has access to firearm in home What has been your use of drugs/alcohol within the last 12 months?: Alcohol Previous Attempts/Gestures: No Intentional Self Injurious Behavior: None Family Suicide History: No Recent stressful life event(s): Conflict (Comment)(Conflict at work) Persecutory voices/beliefs?: No Depression: Yes Depression Symptoms: Despondent, Insomnia, Tearfulness, Isolating, Fatigue, Guilt, Loss of interest in usual pleasures, Feeling worthless/self pity Substance abuse history and/or treatment for substance abuse?: Yes Suicide prevention information given to non-admitted patients: Not applicable  Risk to Others within the past 6 months Homicidal Ideation: No Does patient have any lifetime risk of violence toward others beyond the six months prior to admission? : No Thoughts of Harm to Others: No Current Homicidal Intent: No Current Homicidal Plan: No Access to Homicidal Means: No History  of harm to others?: No Assessment of Violence: None Noted Does patient have access to weapons?: Yes (Comment) Criminal Charges Pending?: No Does patient have a court date: No Is patient on probation?: No  Psychosis Hallucinations: None  noted Delusions: None noted  Mental Status Report Appearance/Hygiene: In scrubs, Unremarkable Eye Contact: Good Motor Activity: Freedom of movement, Unremarkable Speech: Slow, Soft Level of Consciousness: Alert Mood: Depressed Affect: Appropriate to circumstance Anxiety Level: Moderate Thought Processes: Relevant, Coherent Judgement: Impaired Orientation: Person, Place, Time, Situation Obsessive Compulsive Thoughts/Behaviors: None  Cognitive Functioning Concentration: Fair Memory: Remote Intact, Recent Intact Is patient IDD: No Is patient DD?: No Insight: Fair Impulse Control: Poor Appetite: Poor Have you had any weight changes? : Loss Sleep: Decreased Total Hours of Sleep: (Unsure) Vegetative Symptoms: Staying in bed  ADLScreening [facility] Assessment Services) Patient's cognitive ability adequate to safely complete daily activities?: Yes Patient able to express need for assistance with ADLs?: Yes Independently performs ADLs?: Yes (appropriate for developmental age)  Prior Inpatient Therapy Prior Inpatient Therapy: No  Prior Outpatient Therapy Prior Outpatient Therapy: Yes Prior Therapy Dates: 2017 Prior Therapy Facilty/Provider(s): Cannot recall Reason for Treatment: Depression, anxiety Does patient have an ACCT team?: No Does patient have Intensive In-House Services?  : No Does patient have [facility] services? : No Does patient have P4CC services?: No  ADL Screening (condition at time of admission) Patient's cognitive ability adequate to safely complete daily activities?: Yes Is the patient deaf or have difficulty hearing?: No Does the patient have difficulty seeing, even when wearing glasses/contacts?: No Does the patient have difficulty concentrating, remembering, or making decisions?: No Patient able to express need for assistance with ADLs?: Yes Does the patient have difficulty dressing or bathing?: No Independently performs ADLs?: Yes (appropriate for  developmental age) Does the patient have difficulty walking or climbing stairs?: No Weakness of Legs: None Weakness of Arms/Hands: None  Home Assistive Devices/Equipment Home Assistive Devices/Equipment: None  Therapy Consults (therapy consults require a physician order) PT Evaluation Needed: No OT Evalulation Needed: No SLP Evaluation Needed: No Abuse/Neglect Assessment (Assessment to be complete while patient is alone) Abuse/Neglect Assessment Can Be Completed: Yes Physical Abuse: Denies Verbal Abuse: Denies Sexual Abuse: Denies Exploitation of patient/patient's resources: Denies Self-Neglect: Denies Values / Beliefs Cultural Requests During Hospitalization: None Spiritual Requests During Hospitalization: None Consults Spiritual Care Consult Needed: No Social Work Consult Needed: No Regulatory affairs officer (For Healthcare) Does Patient Have a Medical Advance Directive?: No Would patient like information on creating a medical advance directive?: No - Patient declined    Additional Information 1:1 In Past 12 Months?: No CIRT Risk: No Elopement Risk: No Does patient have medical clearance?: Yes     Disposition:  Disposition Initial Assessment Completed for this Encounter: Yes Disposition of Patient: Admit Type of inpatient treatment program: Adult(Per Thedora Hinders, NP, Pt meets inpt criteria)  On Site Evaluation by:   Reviewed with Physician:    Laurena Slimmer Braydyn Schultes 05/07/2017 5:43 PM

## 2017-05-08 ENCOUNTER — Encounter (HOSPITAL_COMMUNITY): Payer: Self-pay | Admitting: Nurse Practitioner

## 2017-05-08 ENCOUNTER — Inpatient Hospital Stay (HOSPITAL_COMMUNITY)
Admission: AD | Admit: 2017-05-08 | Discharge: 2017-05-14 | DRG: 897 | Disposition: A | Discharge status: Home / Self Care | Payer: BLUE CROSS/BLUE SHIELD | Source: Intra-hospital | Attending: Psychiatry | Admitting: Psychiatry

## 2017-05-08 ENCOUNTER — Other Ambulatory Visit: Payer: Self-pay

## 2017-05-08 DIAGNOSIS — Z915 Personal history of self-harm: Secondary | ICD-10-CM

## 2017-05-08 DIAGNOSIS — M797 Fibromyalgia: Secondary | ICD-10-CM | POA: Diagnosis present

## 2017-05-08 DIAGNOSIS — Z9049 Acquired absence of other specified parts of digestive tract: Secondary | ICD-10-CM | POA: Diagnosis not present

## 2017-05-08 DIAGNOSIS — R450 Nervousness: Secondary | ICD-10-CM | POA: Diagnosis not present

## 2017-05-08 DIAGNOSIS — M19011 Primary osteoarthritis, right shoulder: Secondary | ICD-10-CM | POA: Diagnosis present

## 2017-05-08 DIAGNOSIS — X838XXA Intentional self-harm by other specified means, initial encounter: Secondary | ICD-10-CM

## 2017-05-08 DIAGNOSIS — Z8711 Personal history of peptic ulcer disease: Secondary | ICD-10-CM

## 2017-05-08 DIAGNOSIS — Z833 Family history of diabetes mellitus: Secondary | ICD-10-CM | POA: Diagnosis not present

## 2017-05-08 DIAGNOSIS — R9431 Abnormal electrocardiogram [ECG] [EKG]: Secondary | ICD-10-CM | POA: Diagnosis not present

## 2017-05-08 DIAGNOSIS — Z87891 Personal history of nicotine dependence: Secondary | ICD-10-CM

## 2017-05-08 DIAGNOSIS — F900 Attention-deficit hyperactivity disorder, predominantly inattentive type: Secondary | ICD-10-CM | POA: Diagnosis present

## 2017-05-08 DIAGNOSIS — F10239 Alcohol dependence with withdrawal, unspecified: Principal | ICD-10-CM | POA: Diagnosis present

## 2017-05-08 DIAGNOSIS — Z885 Allergy status to narcotic agent status: Secondary | ICD-10-CM | POA: Diagnosis not present

## 2017-05-08 DIAGNOSIS — F332 Major depressive disorder, recurrent severe without psychotic features: Secondary | ICD-10-CM

## 2017-05-08 DIAGNOSIS — R45851 Suicidal ideations: Secondary | ICD-10-CM | POA: Diagnosis not present

## 2017-05-08 DIAGNOSIS — Y904 Blood alcohol level of 80-99 mg/100 ml: Secondary | ICD-10-CM | POA: Diagnosis not present

## 2017-05-08 DIAGNOSIS — Z9114 Patient's other noncompliance with medication regimen: Secondary | ICD-10-CM

## 2017-05-08 DIAGNOSIS — Z91048 Other nonmedicinal substance allergy status: Secondary | ICD-10-CM

## 2017-05-08 DIAGNOSIS — M549 Dorsalgia, unspecified: Secondary | ICD-10-CM | POA: Diagnosis not present

## 2017-05-08 DIAGNOSIS — G8929 Other chronic pain: Secondary | ICD-10-CM | POA: Diagnosis present

## 2017-05-08 DIAGNOSIS — T1491XA Suicide attempt, initial encounter: Secondary | ICD-10-CM

## 2017-05-08 DIAGNOSIS — M2550 Pain in unspecified joint: Secondary | ICD-10-CM | POA: Diagnosis not present

## 2017-05-08 DIAGNOSIS — G4700 Insomnia, unspecified: Secondary | ICD-10-CM | POA: Diagnosis present

## 2017-05-08 DIAGNOSIS — F411 Generalized anxiety disorder: Secondary | ICD-10-CM | POA: Diagnosis present

## 2017-05-08 DIAGNOSIS — Z8249 Family history of ischemic heart disease and other diseases of the circulatory system: Secondary | ICD-10-CM | POA: Diagnosis not present

## 2017-05-08 DIAGNOSIS — G894 Chronic pain syndrome: Secondary | ICD-10-CM | POA: Diagnosis not present

## 2017-05-08 DIAGNOSIS — Z881 Allergy status to other antibiotic agents status: Secondary | ICD-10-CM

## 2017-05-08 DIAGNOSIS — F1020 Alcohol dependence, uncomplicated: Secondary | ICD-10-CM | POA: Diagnosis present

## 2017-05-08 DIAGNOSIS — F1010 Alcohol abuse, uncomplicated: Secondary | ICD-10-CM | POA: Insufficient documentation

## 2017-05-08 DIAGNOSIS — Z79899 Other long term (current) drug therapy: Secondary | ICD-10-CM | POA: Diagnosis not present

## 2017-05-08 DIAGNOSIS — F419 Anxiety disorder, unspecified: Secondary | ICD-10-CM | POA: Diagnosis not present

## 2017-05-08 DIAGNOSIS — I10 Essential (primary) hypertension: Secondary | ICD-10-CM | POA: Diagnosis not present

## 2017-05-08 DIAGNOSIS — F10229 Alcohol dependence with intoxication, unspecified: Secondary | ICD-10-CM | POA: Diagnosis not present

## 2017-05-08 HISTORY — DX: Major depressive disorder, recurrent severe without psychotic features: F33.2

## 2017-05-08 MED ORDER — LORAZEPAM 1 MG PO TABS
1.0000 mg | ORAL_TABLET | Freq: Every day | ORAL | Status: AC
  Administered 2017-05-11: 1 mg via ORAL
  Filled 2017-05-08: qty 1

## 2017-05-08 MED ORDER — ALUM & MAG HYDROXIDE-SIMETH 200-200-20 MG/5ML PO SUSP
30.0000 mL | ORAL | Status: DC | PRN
Start: 2017-05-08 — End: 2017-05-08

## 2017-05-08 MED ORDER — LORAZEPAM 2 MG/ML IJ SOLN: 0.0000 mg | Freq: Four times a day (QID) | INTRAMUSCULAR | Status: DC

## 2017-05-08 MED ORDER — LORAZEPAM 1 MG PO TABS
1.0000 mg | ORAL_TABLET | Freq: Two times a day (BID) | ORAL | Status: AC
  Administered 2017-05-10 (×2): 1 mg via ORAL
  Filled 2017-05-08 (×2): qty 1

## 2017-05-08 MED ORDER — MAGNESIUM HYDROXIDE 400 MG/5ML PO SUSP: 30.0000 mL | Freq: Every day | ORAL | Status: DC | PRN

## 2017-05-08 MED ORDER — LORAZEPAM 1 MG PO TABS: 1.0000 mg | ORAL_TABLET | Freq: Four times a day (QID) | ORAL | Status: DC | PRN

## 2017-05-08 MED ORDER — LORAZEPAM 1 MG PO TABS: 0.0000 mg | ORAL_TABLET | Freq: Two times a day (BID) | ORAL | Status: DC

## 2017-05-08 MED ORDER — ADULT MULTIVITAMIN W/MINERALS CH
1.0000 | ORAL_TABLET | Freq: Every day | ORAL | Status: DC
  Administered 2017-05-08 – 2017-05-14 (×7): 1 via ORAL
  Filled 2017-05-08 (×9): qty 1

## 2017-05-08 MED ORDER — VITAMIN B-1 100 MG PO TABS: 100.0000 mg | ORAL_TABLET | Freq: Every day | ORAL | Status: DC

## 2017-05-08 MED ORDER — VITAMIN B-1 100 MG PO TABS
100.0000 mg | ORAL_TABLET | Freq: Every day | ORAL | Status: DC
  Administered 2017-05-09 – 2017-05-12 (×4): 100 mg via ORAL
  Filled 2017-05-08 (×6): qty 1

## 2017-05-08 MED ORDER — NICOTINE POLACRILEX 2 MG MT GUM
2.0000 mg | CHEWING_GUM | OROMUCOSAL | Status: DC | PRN
  Administered 2017-05-08 – 2017-05-10 (×10): 2 mg via ORAL
  Filled 2017-05-08 (×10): qty 1

## 2017-05-08 MED ORDER — LORAZEPAM 1 MG PO TABS: 0.0000 mg | ORAL_TABLET | Freq: Four times a day (QID) | ORAL | Status: DC

## 2017-05-08 MED ORDER — LORAZEPAM 1 MG PO TABS
1.0000 mg | ORAL_TABLET | Freq: Three times a day (TID) | ORAL | Status: AC
  Administered 2017-05-09 (×3): 1 mg via ORAL
  Filled 2017-05-08 (×4): qty 1

## 2017-05-08 MED ORDER — LORAZEPAM 1 MG PO TABS
1.0000 mg | ORAL_TABLET | Freq: Four times a day (QID) | ORAL | Status: AC
  Administered 2017-05-08 (×3): 1 mg via ORAL
  Filled 2017-05-08 (×2): qty 1

## 2017-05-08 MED ORDER — THIAMINE HCL 100 MG/ML IJ SOLN: 100.0000 mg | Freq: Every day | INTRAMUSCULAR | Status: DC

## 2017-05-08 MED ORDER — THIAMINE HCL 100 MG/ML IJ SOLN
100.0000 mg | Freq: Once | INTRAMUSCULAR | Status: DC
  Filled 2017-05-08: qty 2

## 2017-05-08 MED ORDER — LORAZEPAM 2 MG/ML IJ SOLN: 0.0000 mg | Freq: Two times a day (BID) | INTRAMUSCULAR | Status: DC

## 2017-05-08 NOTE — BHH Counselor (Signed)
Adult Comprehensive Assessment  Patient ID: Raymond [name], male   DOB: [birth year], 42 y.o.   MRN: [id number]  Information Source: Information source: Patient  Current Stressors:  Educational / Learning stressors: some college Employment / Job issues: employed as Clinical biochemist for 25 years Family Relationships: working on estranged relationship with 2nd wife. recently broke up with his girlfriend Museum/gallery curator / Lack of resources (include bankruptcy): income from employment; [employer] / Lack of housing: lives with wife and children Physical health (include injuries & life threatening diseases): none identified Social relationships: few close friends/family supports- brother, wife, children, mother Substance abuse: drinking over 1/5 liquor daily for past 2 months-reports his drinking has been a problem for awhile but increased significantly and affected his work and relationships Bereavement / Loss: death of father in 2017-significant stressor and trigger for patient.   Living/Environment/Situation:  Living Arrangements: Spouse/significant other, Children Living conditions (as described by patient or guardian): lives with wife with whom he recently attempted another try with after separation and break up with his girlfriend How long has patient lived in current situation?: on and off for 10 years  What is atmosphere in current home: Comfortable, Supportive, Loving  Family History:  Marital status: Married Number of Years Married: 10 What types of issues is patient dealing with in the relationship?: recent separation where pt dated another woman and moved in with her. The relationship ended and pt is having a hard time with this. He decided to return home with his wife and children Additional relationship information: wife is supportive however pt's drinking continues to be an issue for her and the family.  Are you sexually active?: Yes What is your sexual orientation?:  heterosexual Has your sexual activity been affected by drugs, alcohol, medication, or emotional stress?: n/a  Does patient have children?: Yes How many children?: 4 How is patient's relationship with their children?: 3 of the four children live with pt. 57 yr, 66 yo, 56 yo daughter, and 42yo-lives with pt's first wife. close to kids.   Childhood History:  By whom was/is the patient raised?: Both parents Additional childhood history information: "great childhood. " parents were married and were great parents Description of patient's relationship with caregiver when they were a child: close to both parents Patient's description of current relationship with people who raised him/her: close to mother; father died in 2017-this was a hard loss for pt. How were you disciplined when you got in trouble as a child/adolescent?: time out; yelled at  Does patient have siblings?: Yes Number of Siblings: 3 Description of patient's current relationship with siblings: 2 brothers and 1 sister--closest to middle brother.  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 "I'm not sure if I'll have a job after this." What is the longest time patient has a held a job?: see above   Where  was the patient employed at that time?: see above  Has patient ever been in the [employer]?: No Has patient ever served in combat?: No Did You Receive Any Psychiatric Treatment/Services While in the [employer]?: No Are There Guns or Other Weapons in [place]?: Yes Types of Guns/Weapons: gun  Are These Weapons Safely Secured?: Yes(CSW contacting wife-to verify gun is secured prior to pt discharge)  Financial Resources:   Financial resources: Income from employment, Support from parents / caregiver, Income from spouse Does patient have a representative payee or guardian?: No  Alcohol/Substance Abuse:   What has been your use of drugs/alcohol within the last 12 months?: alcohol daily with significant increase in consumption 1/5 liquor daily or more over past two months. no drug use indicated by pt.  If attempted suicide, did drugs/alcohol play a role in this?: Yes(pt reports that he was intoxicated and must have made a suicidal statement. "I don't remember anything." per report, pt attempted to hang self while intoxicated-wife stopped him.) Alcohol/Substance Abuse Treatment Hx: Denies past history If yes, describe treatment: "I see my primary care doctor for lexapro."  Has alcohol/substance abuse ever caused legal problems?: No  Social Support System:   Patient's Community Support System: Good Describe Community Support System: "I have close friends and family." Type of faith/religion: Baptist/christian How does patient's faith help to cope with current illness?: prayer "it doesn't really help me much right now."   Leisure/Recreation:   Leisure and Hobbies: "i don't know.'  Strengths/Needs:   What things does the patient do well?: hard worker; motivated to get help with anxiety and alcohol abuse In what areas does patient struggle / problems for patient: coping with loss of relationship with girlfriend. coping with stress, anxiety, and communication  Discharge Plan:   Does patient  have access to transportation?: Yes Will patient be returning to same living situation after discharge?: Yes Currently receiving community mental health services: Yes (From Whom)(PCP prescribes lexapro) If no, would patient like referral for services when discharged?: Yes (What county?)([place]) Does patient have financial barriers related to discharge medications?: No(pt has income and private insurance)  Summary/Recommendations:   Architectural technologist and Recommendations (to be completed by the evaluator): Patient is 42yo male living in [place], [place] ([employer]) with his wife and kids. Patient presents to the hospital due to alcohol intoxication, anxiety and depressive symptoms, SI with plan, and for medication stabilization. Patient currently denies SI/HI/AVH. He reports drinking heavily over the past few months. Patient has a diagnosis of MDD and Alcohol Use Disorder, Severe. Patient sees his PCP for medication management and is requesting medication management with psychiatrist and therapy at discharge. Recommendations for patient include: crisis stabilization, therapeutic milieu, encourage group attendance and participation, medication management for detox/mood stabilization, and development of comprehensive mental wellness/sobriety plan. CSW assessing for appropriate referrals.   Kimber Relic Smart LCSW 05/08/2017 2:55 PM

## 2017-05-08 NOTE — Progress Notes (Signed)
Patient ID: Raymond [name], male   DOB: 10/14/1975, 42 y.o.   MRN: [id number] Patient is a 42 year old male who was admitted involuntarily from [facility].  Pt reports not being able to recall the details of the events leading to him coming to the hospital.  As per report from [facility], patient was found by his wife with a belt around her neck, and when she intercepted, pt began looking for the gun that is stored in the house.  Pt admits that there is a gun in the house, but states that his wife has the keys to the safe where the gun is locked and denies having access to it.  Pt admits to heavy drinking which started two to three months ago, and states that he drinks at least a fifth of liquor per day, and denies any other drug abuse.  Pt denies having any stressors, denies current SI/HI/AVH.  Pt educated to unit rules and protocols and verbalizes understanding, and verbally CFS on the unit. B/P on arrival to unit as follows: 150/100, HR-77, pt medicated with scheduled dose of Ativan 1mg  tab for alcohol detox.  Q15 minute checks initiated for safety, will continue to monitor.

## 2017-05-08 NOTE — ED Notes (Signed)
Patient has been calm, cooperative and polite during shift. Plan of care discussed. Patient continues to deny SI/HI/AVH. Encouragement and support provided and safety maintain. Q 15 min safety checks remain in place.

## 2017-05-08 NOTE — Consult Note (Addendum)
[place] Psychiatry Consult   Reason for Consult:  Suicide attempt Referring Physician:  EDP Patient Identification: Raymond [name] MRN:  [id number] Principal Diagnosis: Major depressive disorder, recurrent severe without psychotic features [facility]) Diagnosis:   Patient Active Problem List   Diagnosis Date Noted  . Major depressive disorder, recurrent severe without psychotic features ([place]) [F33.2] 05/08/2017    Priority: High  . Thumb laceration, right, subsequent encounter [S61.011D] 08/23/2016  . Depression, major, single episode, moderate ([place]) [F32.1] 05/31/2016  . Generalized anxiety disorder [F41.1] 05/31/2016  . Melena [K92.1]   . Rectal polyp [K62.1]   . Problems with swallowing and mastication [R13.10]   . 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 HTN (hypertension) [I10] 04/06/2013  . Allergic rhinitis [J30.9] 01/18/2013  . Family history of long QT syndrome [Z82.49] 06/12/2012  . 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 Smoker [F17.200] 06/12/2012  . Common migraine [G43.009] 05/26/2012  . Fibromyalgia [M79.7] 05/26/2012    Total Time spent with patient: 45 minutes  Subjective:   Raymond [name] is a 42 y.o. male patient admitted with suicide attempt.  HPI:  42 yo male who came to the ED with alcohol abuse and a suicide attempt by hanging.  His wife caught him with a belt around his neck.  He wants help for his drinking and has been taking sleep medications with the alcohol.  He did not go to work this week, family very concerned.  Agreeable to come inpatient for some help.  Hopelessness, worthlessness, and helplessness.  No homicidal ideations, hallucinations, or withdrawal symptoms.  Past Psychiatric History: alcohol dependence, depression  Risk to Self: Suicidal Ideation: Yes-Currently Present Suicidal Intent: Yes-Currently Present Is patient at risk for suicide?: Yes Suicidal Plan?:  Yes-Currently Present Specify Current Suicidal Plan: Pt tried to locate gun; tried to hang self w/belt Access to Means: Yes Specify Access to Suicidal Means: Has access to firearm in home What has been your use of drugs/alcohol within the last 12 months?: Alcohol Intentional Self Injurious Behavior: None Risk to Others: Homicidal Ideation: No Thoughts of Harm to Others: No Current Homicidal Intent: No Current Homicidal Plan: No Access to Homicidal Means: No History of harm to others?: No Assessment of Violence: None Noted Does patient have access to weapons?: Yes (Comment) Criminal Charges Pending?: No Does patient have a court date: No Prior Inpatient Therapy: Prior Inpatient Therapy: No Prior Outpatient Therapy: Prior Outpatient Therapy: Yes Prior Therapy Dates: 2017 Prior Therapy Facilty/Provider(s): Cannot recall Reason for Treatment: Depression, anxiety Does patient have an ACCT team?: No Does patient have Intensive In-House Services?  : No Does patient have [facility] services? : No Does patient have P4CC services?: No  Past Medical History:  Past Medical History:  Diagnosis Date  . Biceps tendonitis on right 01/2014  . History of gastric ulcer    summer 2015  . Hypertension    has not been taking his medication; advised to start back on med. today  . 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 Shoulder impingement 01/2014   right    Past Surgical History:  Procedure Laterality Date  . CHOLECYSTECTOMY    . COLONOSCOPY WITH PROPOFOL N/A 05/14/2016   Procedure: COLONOSCOPY WITH PROPOFOL;  Surgeon: Lucilla Lame, MD;  Location: [facility] ENDOSCOPY;  Service: Endoscopy;  Laterality: N/A;  . ESOPHAGOGASTRODUODENOSCOPY (EGD) WITH PROPOFOL N/A 05/14/2016   Procedure: ESOPHAGOGASTRODUODENOSCOPY (EGD) WITH PROPOFOL;  Surgeon: Lucilla Lame, MD;  Location: [facility] ENDOSCOPY;  Service: Endoscopy;  Laterality: N/A;  . RESECTION DISTAL CLAVICAL Right 02/21/2014   Procedure: RESECTION DISTAL  CLAVICAL;  Surgeon: Nita Sells, MD;  Location: [facility];  Service: Orthopedics;  Laterality: Right;  . SHOULDER ARTHROSCOPY WITH SUBACROMIAL DECOMPRESSION Right 02/21/2014   Procedure: SHOULDER ARTHROSCOPY WITH SUBACROMIAL DECOMPRESSION, DISTAL CLAVICAL EXCISION, DEBRIDIMENT OF PARTIAL ROTATOR CUFF TEAR;  Surgeon: Nita Sells, MD;  Location: [facility];  Service: Orthopedics;  Laterality: Right;  Right shoulder arthroscopy subacromial decompression, distal clavical excision   Family History:  Family History  Problem Relation Age of Onset  . Diabetes Mother   . Heart disease Mother   . Long QT syndrome Mother   . Lung disease Father   . Diabetes Father    Family Psychiatric  History: none Social History:  Social History   Substance and Sexual Activity  Alcohol Use Yes  . Alcohol/week: 0.0 oz   Comment: 1/5 daily     Social History   Substance and Sexual Activity  Drug Use No    Social History   Socioeconomic History  . Marital status: Married    Spouse name: Not on file  . Number of children: Not on file  . Years of education: Not on file  . Highest education level: Not on file  Occupational History  . Occupation: Programmer, systems: Programme researcher, broadcasting/film/video  Social Needs  . Financial resource strain: Not on file  . Food insecurity:    Worry: Not on file    Inability: Not on file  . Transportation needs:    Medical: Not on file    Non-medical: Not on file  Tobacco Use  . Smoking status: Former Smoker    Last attempt to quit: 09/24/2013    Years since quitting: 3.6  . Smokeless tobacco: Never Used  Substance and Sexual Activity  . Alcohol use: Yes    Alcohol/week: 0.0 oz    Comment: 1/5 daily  . Drug use: No  . Sexual activity: Yes    Partners: Female  Lifestyle  . Physical activity:    Days per week: Not on file    Minutes per session: Not on file  . Stress: Not on file  Relationships  . Social connections:     Talks on phone: Not on file    Gets together: Not on file    Attends religious service: Not on file    Active member of club or organization: Not on file    Attends meetings of clubs or organizations: Not on file    Relationship status: Not on file  Other Topics Concern  . Not on file  Social History Narrative   Mom was in hospital with long QT syndrome, then heart in A Fib.   Sister has long QT syndrome   1st cousin: long QT syndrome         Additional Social History: N/A    Allergies:   Allergies  Allergen Reactions  . Azithromycin Nausea And Vomiting  . Percocet [Oxycodone-Acetaminophen] Hives  . Vicodin [Hydrocodone-Acetaminophen] Hives  . Adhesive [Tape] Other (See Comments)    SKIN IRRITATION    Labs:  Results for orders placed or performed during the hospital encounter of 05/07/17 (from the past 48 hour(s))  Comprehensive metabolic panel     Status: Abnormal   Collection Time: 05/07/17  5:08 PM  Result Value Ref Range   Sodium 141 135 - 145 mmol/L   Potassium 3.2 (L) 3.5 - 5.1 mmol/L  Chloride 103 101 - 111 mmol/L   CO2 21 (L) 22 - 32 mmol/L   Glucose, Bld 125 (H) 65 - 99 mg/dL   BUN 11 6 - 20 mg/dL   Creatinine, Ser 1.02 0.61 - 1.24 mg/dL   Calcium 8.7 (L) 8.9 - 10.3 mg/dL   Total Protein 7.3 6.5 - 8.1 g/dL   Albumin 4.0 3.5 - 5.0 g/dL   AST 30 15 - 41 U/L   ALT 53 17 - 63 U/L   Alkaline Phosphatase 76 38 - 126 U/L   Total Bilirubin 0.6 0.3 - 1.2 mg/dL   GFR calc non Af Amer >60 >60 mL/min   GFR calc Af Amer >60 >60 mL/min    Comment: (NOTE) The eGFR has been calculated using the CKD EPI equation. This calculation has not been validated in all clinical situations. eGFR's persistently <60 mL/min signify possible Chronic Kidney Disease.    Anion gap 17 (H) 5 - 15    Comment: Performed at [facility], [place] [address], [place], [place] [address]  Ethanol     Status: Abnormal   Collection Time: 05/07/17  5:08 PM  Result Value Ref  Range   Alcohol, Ethyl (B) 99 (H) <10 mg/dL    Comment:        LOWEST DETECTABLE LIMIT FOR SERUM ALCOHOL IS 10 mg/dL FOR MEDICAL PURPOSES ONLY Performed at [facility], [place] [address] [place], [place] [address]   Salicylate level     Status: None   Collection Time: 05/07/17  5:08 PM  Result Value Ref Range   Salicylate Lvl <2.2 2.8 - 30.0 mg/dL    Comment: Performed at [facility], [place] [address], [place], [place] [address]  Acetaminophen level     Status: Abnormal   Collection Time: 05/07/17  5:08 PM  Result Value Ref Range   Acetaminophen (Tylenol), Serum <10 (L) 10 - 30 ug/mL    Comment:        THERAPEUTIC CONCENTRATIONS VARY SIGNIFICANTLY. A RANGE OF 10-30 ug/mL MAY BE AN EFFECTIVE CONCENTRATION FOR MANY PATIENTS. HOWEVER, SOME ARE BEST TREATED AT CONCENTRATIONS OUTSIDE THIS RANGE. ACETAMINOPHEN CONCENTRATIONS >150 ug/mL AT 4 HOURS AFTER INGESTION AND >50 ug/mL AT 12 HOURS AFTER INGESTION ARE OFTEN ASSOCIATED WITH TOXIC REACTIONS. Performed at [facility], [place] [address], [place], [place] [address]   cbc     Status: Abnormal   Collection Time: 05/07/17  5:08 PM  Result Value Ref Range   WBC 10.0 4.0 - 10.5 K/uL   RBC 5.65 4.22 - 5.81 MIL/uL   Hemoglobin 18.8 (H) 13.0 - 17.0 g/dL   HCT 52.4 (H) 39.0 - 52.0 %   MCV 92.7 78.0 - 100.0 fL   MCH 33.3 26.0 - 34.0 pg   MCHC 35.9 30.0 - 36.0 g/dL   RDW 13.6 11.5 - 15.5 %   Platelets 228 150 - 400 K/uL    Comment: Performed at [facility], [place] [address], [place], [place] [address]  Rapid urine drug screen (hospital performed)     Status: None   Collection Time: 05/07/17  5:08 PM  Result Value Ref Range   Opiates NONE DETECTED NONE DETECTED   Cocaine NONE DETECTED NONE DETECTED   Benzodiazepines NONE DETECTED NONE DETECTED   Amphetamines NONE DETECTED NONE DETECTED   Tetrahydrocannabinol NONE DETECTED NONE DETECTED   Barbiturates NONE  DETECTED NONE DETECTED    Comment: (NOTE) DRUG SCREEN FOR MEDICAL PURPOSES ONLY.  IF CONFIRMATION IS NEEDED FOR ANY PURPOSE, NOTIFY LAB WITHIN 5 DAYS. LOWEST DETECTABLE LIMITS FOR URINE DRUG SCREEN Drug Class                     Cutoff (ng/mL) Amphetamine and metabolites    1000 Barbiturate and metabolites    200 Benzodiazepine                 800 Tricyclics and metabolites     300 Opiates and metabolites        300 Cocaine and metabolites        300 THC                            50 Performed at [facility], [place] [address] [place], [place] [address]   Lipase, blood     Status: None   Collection Time: 05/07/17  5:08 PM  Result Value Ref Range   Lipase 44 11 - 51 U/L    Comment: Performed at [facility], [place] [address] [place], [place] [address]    Current Facility-Administered Medications  Medication Dose Route Frequency Provider Last Rate Last Dose  . LORazepam (ATIVAN) injection 0-4 mg  0-4 mg Intravenous Q6H Charlesetta Shanks, MD   Stopped at 05/07/17 1717   Or  . LORazepam (ATIVAN) tablet 0-4 mg  0-4 mg Oral Q6H Charlesetta Shanks, MD   1 mg at 05/08/17 0527  . [START ON 05/10/2017] LORazepam (ATIVAN) injection 0-4 mg  0-4 mg Intravenous Q12H Charlesetta Shanks, MD       Or  . Derrill Memo ON 05/10/2017] LORazepam (ATIVAN) tablet 0-4 mg  0-4 mg Oral Q12H Pfeiffer, Marcy, MD      . thiamine (VITAMIN B-1) tablet 100 mg  100 mg Oral Daily Pfeiffer, Marcy, MD       Or  . thiamine (B-1) injection 100 mg  100 mg Intravenous Daily Charlesetta Shanks, MD   100 mg at 05/07/17 1726   Current Outpatient Medications  Medication Sig Dispense Refill  . Cholecalciferol (VITAMIN D PO) Take 5,000 mg by mouth daily.    . hydrochlorothiazide (MICROZIDE) 12.5 MG capsule TAKE ONE CAPSULE BY MOUTH EVERY DAY 90 capsule 1  . zolpidem (AMBIEN CR) 12.5 MG CR tablet TAKE 1 TABLET BY MOUTH NIGHTLY AS NEEDED (Patient taking differently: TAKE 1 TABLET BY MOUTH NIGHTLY AS NEEDED  SLEEP) 30 tablet 0  . amphetamine-dextroamphetamine (ADDERALL) 15 MG tablet Take 1 tablet by mouth 2 (two) times daily with a meal. (Patient taking differently: Take 15 mg by mouth 2 (two) times daily as needed (adhd). ) 60 tablet 0  . cetirizine-pseudoephedrine (ZYRTEC-D) 5-120 MG per tablet Take 1 tablet by mouth daily as needed for allergies.     . cyclobenzaprine (FLEXERIL) 10 MG tablet Take 1 tablet (10 mg total) by mouth at bedtime. (Patient not taking: Reported on 05/07/2017) 30 tablet 1  . escitalopram (LEXAPRO) 10 MG tablet Take 1 tablet (10 mg total) by mouth daily. (Patient taking differently: Take 10 mg by mouth daily as needed (depression). ) 90 tablet 1  . etodolac (LODINE) 500 MG tablet TAKE 1 TABLET BY MOUTH TWICE A DAY (Patient taking differently: TAKE 1 TABLET BY MOUTH TWICE A DAY PRN PAIN) 60 tablet 5  . fluticasone (FLONASE) 50 MCG/ACT nasal spray Place 2 sprays into both nostrils daily. (Patient taking differently: Place 2 sprays into both nostrils daily as needed for allergies. )  16 g 5  . lidocaine (LIDODERM) 5 % Place 1 patch onto the skin daily. Remove & Discard patch within 12 hours or as directed by MD (Patient not taking: Reported on 05/07/2017) 30 patch 0  . sildenafil (VIAGRA) 100 MG tablet Take 1 tablet (100 mg total) by mouth as needed for erectile dysfunction. 10 tablet 11  . sildenafil (VIAGRA) 100 MG tablet Take 0.5-1 tablets (50-100 mg total) by mouth daily as needed for erectile dysfunction. (Patient not taking: Reported on 05/07/2017) 30 tablet 11    Musculoskeletal: Strength & Muscle Tone: within normal limits Gait & Station: normal Patient leans: N/A  Psychiatric Specialty Exam: Physical Exam  Nursing note and vitals reviewed. Constitutional: He is oriented to person, place, and time. He appears well-developed and well-nourished.  HENT:  Head: Normocephalic and atraumatic.  Neck: Normal range of motion.  Respiratory: Effort normal.  Musculoskeletal:  Normal range of motion.  Neurological: He is alert and oriented to person, place, and time.  Psychiatric: His speech is normal and behavior is normal. Judgment normal. Cognition and memory are normal. He exhibits a depressed mood. He expresses suicidal ideation. He expresses suicidal plans.    Review of Systems  Psychiatric/Behavioral: Positive for depression, substance abuse and suicidal ideas.  All other systems reviewed and are negative.   Blood pressure (!) 132/95, pulse 71, temperature 97.9 F (36.6 C), temperature source Oral, resp. rate 16, SpO2 97 %.There is no height or weight on file to calculate BMI.  General Appearance: Casual  Eye Contact:  Fair  Speech:  Normal Rate  Volume:  Decreased  Mood:  Depressed  Affect:  Congruent  Thought Process:  Coherent and Descriptions of Associations: Intact  Orientation:  Full (Time, Place, and Person)  Thought Content:  Rumination  Suicidal Thoughts:  Yes.  with intent/plan  Homicidal Thoughts:  No  Memory:  Immediate;   Good Recent;   Fair Remote;   Fair  Judgement:  Impaired  Insight:  Fair  Psychomotor Activity:  Decreased  Concentration:  Concentration: Fair and Attention Span: Fair  Recall:  [employer] of Knowledge:  Fair  Language:  Good  Akathisia:  No  Handed:  Right  AIMS (if indicated):   N/A  Assets:  Housing Intimacy Leisure Time Physical Health Resilience Social Support Vocational/Educational  ADL's:  Intact  Cognition:  WNL  Sleep:   N/A     Treatment Plan Summary: Daily contact with patient to assess and evaluate symptoms and progress in treatment, Medication management and Plan major depressive disorder, recurrent, severe without psychosis:  -Crisis stabilization -Medication management:  Started Ativan alcohol detox protocol  -Individual counseling  Disposition: Recommend psychiatric Inpatient admission when medically cleared.  Waylan Boga, NP 05/08/2017 8:13 AM   Patient seen face-to-face for  psychiatric evaluation, chart reviewed and case discussed with the physician extender and developed treatment plan. Reviewed the information documented and agree with the treatment plan.  Buford Dresser, DO 05/08/17 7:04 PM

## 2017-05-08 NOTE — ED Notes (Signed)
Pt pleasant on approach. Endorsing passive SI. Encouragement and support provided. Special checks q 15 mins in place for safety. Video monitoring in place. Will continue to monitor.

## 2017-05-08 NOTE — BH Assessment (Signed)
[facility] Assessment Progress Note  Per Buford Dresser, DO, this pt requires psychiatric hospitalization.  Letitia Libra, RN, [facility] has assigned pt to [facility] Rm 301-2.  Pt presents under IVC, and IVC documents have been faxed to [facility].  Pt's nurse, Caryl Pina, has been notified, and agrees to call report to [phone].  Pt is to be transported via Event organ[employer].   Jalene Mullet, [place] Coordinator [phone]

## 2017-05-08 NOTE — ED Notes (Signed)
[facility] on unit to transfer pt to [facility] Adult unit per MD order. Personal property given to [employer] for transfer. Pt ambulatory off unit in police custody.  

## 2017-05-08 NOTE — [facility] Group Notes (Signed)
LCSW Group Therapy Note  05/08/2017 1:15pm  Type of Therapy/Topic:  Group Therapy:  Feelings about Diagnosis  Participation Level:  Active   Description of Group:   This group will allow patients to explore their thoughts and feelings about diagnoses they have received. Patients will be guided to explore their level of understanding and acceptance of these diagnoses. Facilitator will encourage patients to process their thoughts and feelings about the reactions of others to their diagnosis and will guide patients in identifying ways to discuss their diagnosis with significant others in their lives. This group will be process-oriented, with patients participating in exploration of their own experiences, giving and receiving support, and processing challenge from other group members.   Therapeutic Goals: 1. Patient will demonstrate understanding of diagnosis as evidenced by identifying two or more symptoms of the disorder 2. Patient will be able to express two feelings regarding the diagnosis 3. Patient will demonstrate their ability to communicate their needs through discussion and/or role play  Summary of Patient Progress:  Raymond [name] was attentive and engaged during today's processing group. He shared that he just arrive on the unit and has not yet seen a provider, nor has he ever been in a psychiatric facility before. "I'm really anxious because I don't know what to expect." [name] continues to make progress in the group setting with improving insight.   Therapeutic Modalities:   Cognitive Behavioral Therapy Brief Therapy Feelings Identification    Kimber Relic Smart, LCSW 05/08/2017 1:01 PM

## 2017-05-08 NOTE — Progress Notes (Addendum)
Patient ID: Raymond [name], male   DOB: 04/02/1975, 42 y.o.   MRN: [id number]  Pt currently presents with an irritable affect and restless behavior. Pt can be seen pacing in the hallway tonight. Pt reports to writer that their goal is to "get some sleep, I normally take Ambien or Lunesta." Pt reports ongoing cravings, reports nicotine gum is helping currently. Presents with signs and symptoms of withdrawal including malaise and anxiety.    Pt provided with medications per providers orders. Pt's labs and vitals were monitored throughout the night. Pt given a 1:1 about emotional and mental status. Pt supported and encouraged to express concerns and questions. Pt educated on medications. Pt encouraged to speak with MD about sleep medications in the morning.   Pt's safety ensured with 15 minute and environmental checks. Pt currently denies SI/HI and A/V hallucinations. Pt verbally agrees to seek staff if SI/HI or A/VH occurs and to consult with staff before acting on any harmful thoughts. Will continue POC.

## 2017-05-08 NOTE — Progress Notes (Signed)
Adult Psychoeducational Group Note  Date:  05/08/2017 Time:  9:18 PM  Group Topic/Focus:  Wrap-Up Group:   The focus of this group is to help patients review their daily goal of treatment and discuss progress on daily workbooks.  Participation Level:  Active  Participation Quality:  Appropriate  Affect:  Appropriate  Cognitive:  Appropriate  Insight: Appropriate  Engagement in Group:  Engaged  Modes of Intervention:  Discussion  Additional Comments:  Patient attended group and participated in the group discussion.   Germaine Ripp W Branon Sabine 9/83/3825, 9:18 PM

## 2017-05-09 DIAGNOSIS — F332 Major depressive disorder, recurrent severe without psychotic features: Secondary | ICD-10-CM

## 2017-05-09 DIAGNOSIS — M549 Dorsalgia, unspecified: Secondary | ICD-10-CM

## 2017-05-09 DIAGNOSIS — Y904 Blood alcohol level of 80-99 mg/100 ml: Secondary | ICD-10-CM

## 2017-05-09 DIAGNOSIS — M2550 Pain in unspecified joint: Secondary | ICD-10-CM

## 2017-05-09 DIAGNOSIS — R450 Nervousness: Secondary | ICD-10-CM

## 2017-05-09 DIAGNOSIS — F1020 Alcohol dependence, uncomplicated: Secondary | ICD-10-CM

## 2017-05-09 HISTORY — DX: Alcohol dependence, uncomplicated: F10.20

## 2017-05-09 MED ORDER — ETODOLAC 300 MG PO CAPS
300.0000 mg | ORAL_CAPSULE | Freq: Two times a day (BID) | ORAL | Status: DC
  Administered 2017-05-09 – 2017-05-14 (×10): 300 mg via ORAL
  Filled 2017-05-09 (×14): qty 1

## 2017-05-09 MED ORDER — FLUTICASONE PROPIONATE 50 MCG/ACT NA SUSP
2.0000 | Freq: Every day | NASAL | Status: DC
  Administered 2017-05-09 – 2017-05-13 (×2): 2 via NASAL
  Filled 2017-05-09: qty 16

## 2017-05-09 MED ORDER — ETODOLAC 200 MG PO CAPS
200.0000 mg | ORAL_CAPSULE | Freq: Two times a day (BID) | ORAL | Status: DC
  Administered 2017-05-09 – 2017-05-14 (×10): 200 mg via ORAL
  Filled 2017-05-09 (×14): qty 1

## 2017-05-09 MED ORDER — ESCITALOPRAM OXALATE 10 MG PO TABS
10.0000 mg | ORAL_TABLET | Freq: Every day | ORAL | Status: DC
  Administered 2017-05-09 – 2017-05-11 (×3): 10 mg via ORAL
  Filled 2017-05-09 (×5): qty 1

## 2017-05-09 MED ORDER — VITAMIN D3 25 MCG (1000 UNIT) PO TABS
1000.0000 [IU] | ORAL_TABLET | Freq: Every day | ORAL | Status: DC
  Administered 2017-05-09 – 2017-05-14 (×6): 1000 [IU] via ORAL
  Filled 2017-05-09 (×10): qty 1

## 2017-05-09 MED ORDER — TRAZODONE HCL 50 MG PO TABS
50.0000 mg | ORAL_TABLET | Freq: Every evening | ORAL | Status: DC | PRN
  Administered 2017-05-09 (×2): 50 mg via ORAL
  Filled 2017-05-09 (×4): qty 1

## 2017-05-09 MED ORDER — ETODOLAC 500 MG PO TABS: 500.0000 mg | ORAL_TABLET | Freq: Two times a day (BID) | ORAL | Status: DC

## 2017-05-09 MED ORDER — LORATADINE 10 MG PO TABS
10.0000 mg | ORAL_TABLET | Freq: Every day | ORAL | Status: DC
  Administered 2017-05-09 – 2017-05-14 (×6): 10 mg via ORAL
  Filled 2017-05-09 (×8): qty 1

## 2017-05-09 MED ORDER — HYDROCHLOROTHIAZIDE 12.5 MG PO CAPS
12.5000 mg | ORAL_CAPSULE | Freq: Every day | ORAL | Status: DC
  Administered 2017-05-09 – 2017-05-12 (×4): 12.5 mg via ORAL
  Filled 2017-05-09 (×6): qty 1

## 2017-05-09 MED ORDER — HYDROXYZINE HCL 50 MG PO TABS
50.0000 mg | ORAL_TABLET | Freq: Four times a day (QID) | ORAL | Status: DC | PRN
  Administered 2017-05-09 – 2017-05-11 (×3): 50 mg via ORAL
  Filled 2017-05-09: qty 30
  Filled 2017-05-09 (×3): qty 1

## 2017-05-09 NOTE — Progress Notes (Signed)
D: Patient self inventory- Patient slept poorly last night, requested sleep medication. Appetite was fair, energy level is low. Concentration is good. Patient rated depression 5, hopelessness 0, and anxiety 7. Patient is withdrawing from drugs and alcohol and is experiencing diarrhea, runny nose, and nausea. Denies thoughts of hurting himself today. Denies physical problems and pain today. Patient's goal is "getting out, not drinking" and patient said he will do "everything asked of me" to help reach his goal. Patient has not helped develop a discharge plan.   A: Patient provided with medications per MD orders and given mental and emotional support. Patient complained of not sleeping well, and requested sleep medications. Stated he usually takes Ambien at home, but was explained to that Ambien will not be prescribed during his stay at [facility]. The request was documented and patient was encouraged to talk to the NP medication to help him sleep.  R: Patient's safety ensured with 15 minute safety and environmental checks. Patient currently denies SI/HI and A/V hallucinations. Patient verbally agrees to seek staff if SI/HI/AVH occurs and to consult with staff before acting on any harmful thoughts. Will continue monitor patient for safety.

## 2017-05-09 NOTE — Progress Notes (Signed)
Nutrition Brief Note  Patient identified on the Malnutrition Screening Tool (MST) Report  Wt Readings from Last 15 Encounters:  05/08/17 161 lb (73 kg)  08/23/16 169 lb 12 oz (77 kg)  08/13/16 170 lb (77.1 kg)  05/30/16 162 lb 4 oz (73.6 kg)  05/14/16 154 lb (69.9 kg)  05/03/16 154 lb (69.9 kg)  04/24/16 153 lb (69.4 kg)  04/18/16 156 lb 8 oz (71 kg)  04/16/16 153 lb (69.4 kg)  04/10/16 158 lb 4 oz (71.8 kg)  04/02/16 163 lb (73.9 kg)  03/28/16 166 lb 8 oz (75.5 kg)  03/25/16 170 lb (77.1 kg)  03/11/16 170 lb 12 oz (77.5 kg)  01/11/15 173 lb (78.5 kg)    Body mass index is 24.48 kg/m. Patient meets criteria for normal weight based on current BMI. Pt admitted to [facility] for recurrent, severe MDD. Pt has lost 8 lbs (4.7% body weight) in the past 8 months. This is not significant for time frame.   Current diet order is Heart Healthy and patient is eating as desired for meals and snacks at this time. Labs and medications reviewed.   No nutrition interventions warranted at this time. If nutrition issues arise, please consult RD.      Jarome Matin, MS, RD, LDN, [facility] Inpatient Clinical Dietitian Pager # [phone] After hours/weekend pager # [phone]

## 2017-05-09 NOTE — Progress Notes (Signed)
Adult Psychoeducational Group Note  Date:  05/09/2017 Time:  8:40 PM  Group Topic/Focus:  Wrap-Up Group:   The focus of this group is to help patients review their daily goal of treatment and discuss progress on daily workbooks.  Participation Level:  Active  Participation Quality:  Appropriate  Affect:  Appropriate  Cognitive:  Appropriate  Insight: Appropriate  Engagement in Group:  Engaged  Modes of Intervention:  Discussion  Additional Comments:  Patient attended [facility] Group meeting and participated appropriately with the group.[place] Coach Kong Packett 2/80/0349, 8:40 PM

## 2017-05-09 NOTE — Progress Notes (Signed)
Recreation Therapy Notes  Date: 3.22.19 Time: 9:30 a.m. Location: [address] Dayroom   Group Topic: Stress Management   Goal Area(s) Addresses:  Goal 1.1: To reduce stress  -Patient will report feeling a reduction in stress level  -Patient will identify the importance of stress management  -Patient will participate during stress management group treatment     Behavioral Response: Engaged   Intervention: Stress Management   Activity: Meditattion- Patients were in a peaceful environment with soft lighting enhancing patients mood. Patients listened to mindfulness voice over to decrease stress levels   Education: Stress Management, Discharge Planning.    Education Outcome: Acknowledges edcuation/In group clarification offered/Needs additional education   Clinical Observations/Feedback:: Patient attended and participated appropriately during stress management group treatment. Patient reported feeling a reduction in stress level.    Ranell Patrick, Recreation Therapy Intern   Ranell Patrick 05/09/2017 11:28 AM

## 2017-05-09 NOTE — Tx Team (Signed)
Interdisciplinary Treatment and Diagnostic Plan Update  05/09/2017 Time of Session: 0830AM Raymond [name] MRN: [id number]  Principal Diagnosis: MDD, recurrent, severe, without psychotic features  Secondary Diagnoses: Active Problems:   Major depressive disorder, recurrent severe without psychotic features (Raymond [name])   Current Medications:  Current Facility-Administered Medications  Medication Dose Route Frequency Provider Last Rate Last Dose  . LORazepam (ATIVAN) tablet 1 mg  1 mg Oral TID [name], Myer Peer, MD   1 mg at 05/09/17 1324   Followed by  . [START ON 05/10/2017] LORazepam (ATIVAN) tablet 1 mg  1 mg Oral BID [name], Myer Peer, MD       Followed by  . [START ON 05/11/2017] LORazepam (ATIVAN) tablet 1 mg  1 mg Oral Daily [name], Raymond A, MD      . magnesium hydroxide (MILK OF MAGNESIA) suspension 30 mL  30 mL Oral Daily PRN Raymond Pour, NP      . multivitamin with minerals tablet 1 tablet  1 tablet Oral Daily [name], Myer Peer, MD   1 tablet at 05/09/17 [phone]  . nicotine polacrilex (NICORETTE) gum 2 mg  2 mg Oral PRN [name], Myer Peer, MD   2 mg at 05/08/17 2005  . thiamine (B-1) injection 100 mg  100 mg Intramuscular Once [name], Raymond A, MD      . thiamine (VITAMIN B-1) tablet 100 mg  100 mg Oral Daily [name], Myer Peer, MD   100 mg at 05/09/17 0808   PTA Medications: Medications Prior to Admission  Medication Sig Dispense Refill Last Dose  . amphetamine-dextroamphetamine (ADDERALL) 15 MG tablet Take 1 tablet by mouth 2 (two) times daily with a meal. (Patient taking differently: Take 15 mg by mouth 2 (two) times daily as needed (adhd). ) 60 tablet 0 unknown  . cetirizine-pseudoephedrine (ZYRTEC-D) 5-120 MG per tablet Take 1 tablet by mouth daily as needed for allergies.    unknown  . Cholecalciferol (VITAMIN D PO) Take 5,000 mg by mouth daily.   Past Month at Unknown time  . cyclobenzaprine (FLEXERIL) 10 MG tablet Take 1 tablet (10 mg total) by mouth at bedtime. (Patient not  taking: Reported on 05/07/2017) 30 tablet 1 Completed Course at Unknown time  . escitalopram (LEXAPRO) 10 MG tablet Take 1 tablet (10 mg total) by mouth daily. (Patient taking differently: Take 10 mg by mouth daily as needed (depression). ) 90 tablet 1 unknown  . etodolac (LODINE) 500 MG tablet TAKE 1 TABLET BY MOUTH TWICE A DAY (Patient taking differently: TAKE 1 TABLET BY MOUTH TWICE A DAY PRN PAIN) 60 tablet 5 unknown  . fluticasone (FLONASE) 50 MCG/ACT nasal spray Place 2 sprays into both nostrils daily. (Patient taking differently: Place 2 sprays into both nostrils daily as needed for allergies. ) 16 g 5 unknown  . hydrochlorothiazide (MICROZIDE) 12.5 MG capsule TAKE ONE CAPSULE BY MOUTH EVERY DAY 90 capsule 1 Past Month at Unknown time  . lidocaine (LIDODERM) 5 % Place 1 patch onto the skin daily. Remove & Discard patch within 12 hours or as directed by MD (Patient not taking: Reported on 05/07/2017) 30 patch 0 Completed Course at Unknown time  . sildenafil (VIAGRA) 100 MG tablet Take 1 tablet (100 mg total) by mouth as needed for erectile dysfunction. 10 tablet 11 unknown  . sildenafil (VIAGRA) 100 MG tablet Take 0.5-1 tablets (50-100 mg total) by mouth daily as needed for erectile dysfunction. (Patient not taking: Reported on 05/07/2017) 30 tablet 11 Completed Course at Unknown time  .  zolpidem (AMBIEN CR) 12.5 MG CR tablet TAKE 1 TABLET BY MOUTH NIGHTLY AS NEEDED (Patient taking differently: TAKE 1 TABLET BY MOUTH NIGHTLY AS NEEDED SLEEP) 30 tablet 0 Past Week at Unknown time     Treatment Modalities: Medication Management, Group therapy, Case management,  1 to 1 session with clinician, Psychoeducation, Recreational therapy.   Physician Treatment Plan for Primary Diagnosis: MDD, recurrent, severe, without psychotic features Medication Management: Evaluate patient's response, side effects, and tolerance of medication regimen.  Therapeutic Interventions: 1 to 1 sessions, Unit Group sessions  and Medication administration.  Evaluation of Outcomes: Progressing  Physician Treatment Plan for Secondary Diagnosis: Active Problems:   Major depressive disorder, recurrent severe without psychotic features ([place])   Medication Management: Evaluate patient's response, side effects, and tolerance of medication regimen.  Therapeutic Interventions: 1 to 1 sessions, Unit Group sessions and Medication administration.  Evaluation of Outcomes: Progressing   RN Treatment Plan for Primary Diagnosis: MDD, recurrent, severe, without psychotic features Long Term Goal(s): Knowledge of disease and therapeutic regimen to maintain health will improve  Short Term Goals: Ability to remain free from injury will improve, Ability to participate in decision making will improve and Ability to identify and develop effective coping behaviors will improve  Medication Management: RN will administer medications as ordered by provider, will assess and evaluate patient's response and provide education to patient for prescribed medication. RN will report any adverse and/or side effects to prescribing provider.  Therapeutic Interventions: 1 on 1 counseling sessions, Psychoeducation, Medication administration, Evaluate responses to treatment, Monitor vital signs and CBGs as ordered, Perform/monitor CIWA, COWS, AIMS and Fall Risk screenings as ordered, Perform wound care treatments as ordered.  Evaluation of Outcomes: Progressing   LCSW Treatment Plan for Primary Diagnosis:MDD, recurrent, severe, without psychotic features  Long Term Goal(s): Safe transition to appropriate next level of care at discharge, Engage patient in therapeutic group addressing interpersonal concerns.  Short Term Goals: Engage patient in aftercare planning with referrals and resources, Facilitate patient progression through stages of change regarding substance use diagnoses and concerns and Identify triggers associated with mental  health/substance abuse issues  Therapeutic Interventions: Assess for all discharge needs, 1 to 1 time with Social worker, Explore available resources and support systems, Assess for adequacy in community support network, Educate family and significant other(s) on suicide prevention, Complete Psychosocial Assessment, Interpersonal group therapy.  Evaluation of Outcomes: Progressing   Progress in Treatment: Attending groups: Yes. Participating in groups: Yes. Taking medication as prescribed: Yes. Toleration medication: Yes. Family/Significant other contact made: No, will contact:  pt's wife for collateral contact. Patient understands diagnosis: Yes. Discussing patient identified problems/goals with staff: Yes. Medical problems stabilized or resolved: Yes. Denies suicidal/homicidal ideation: Yes. Issues/concerns per patient self-inventory: No. Other: n/a   New problem(s) identified: No, Describe:  n/a  New Short Term/Long Term Goal(s): detox, medication management for mood stabilization; elimination of SI thoughts; development of comprehensive mental wellness/sobriety plan.   Patient Goal: "To get sober and get help for anxiety."  Discharge Plan or Barriers: CSW assessing for appropriate referrals. He is interested in medication management and therapy in [place]. [place] pamphlet and AA list for [employer] and [place] provided to pt.   Reason for Continuation of Hospitalization: Anxiety Depression Medication stabilization Suicidal ideation Withdrawal symptoms  Estimated Length of Stay: Monday 05/12/17  Attendees: Patient: Raymond [name] 05/09/2017 8:58 AM  Physician: Dr. Nancy Fetter MD; Dr. Parke Poisson MD 05/09/2017 8:58 AM  Nursing: Santiago Glad RN; [place] RN 05/09/2017 8:58 AM  RN Care Manager:x 05/09/2017 8:58 AM  Social Worker: Bobbye Riggs 05/09/2017 8:58 AM  Recreational Therapist: x 05/09/2017 8:58 AM  Other: Lindell Spar NP 05/09/2017 8:58 AM  Other:  05/09/2017 8:58  AM  Other: 05/09/2017 8:58 AM    Scribe for Treatment Team: [place], LCSW 05/09/2017 8:58 AM

## 2017-05-09 NOTE — [facility] Suicide Risk Assessment (Signed)
[facility] Admission Suicide Risk Assessment   Nursing information obtained from:    Demographic factors:    Current Mental Status:    Loss Factors:    Historical Factors:    Risk Reduction Factors:     Total Time spent with patient: 1 hour Principal Problem: Alcohol use disorder, severe, dependence ([place]) Diagnosis:   Patient Active Problem List   Diagnosis Date Noted  . Alcohol use disorder, severe, dependence ([place]) [F10.20] 05/09/2017  . Major depressive disorder, recurrent severe without psychotic features ([place]) [F33.2] 05/08/2017  . Alcohol abuse [F10.10]   . Thumb laceration, right, subsequent encounter [S61.011D] 08/23/2016  . Depression, major, single episode, moderate ([place]) [F32.1] 05/31/2016  . Generalized anxiety disorder [F41.1] 05/31/2016  . Melena [K92.1]   . Rectal polyp [K62.1]   . Problems with swallowing and mastication [R13.10]   . 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 HTN (hypertension) [I10] 04/06/2013  . Allergic rhinitis [J30.9] 01/18/2013  . Family history of long QT syndrome [Z82.49] 06/12/2012  . 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 Smoker [F17.200] 06/12/2012  . Common migraine [G43.009] 05/26/2012  . Fibromyalgia [M79.7] 05/26/2012   Subjective Data:   Raymond [name] is a 42 y/o M with history of depression and alcohol use disorder who was admitted on IVC brought in by family with worsening symptoms of depression, alcohol use, and suicide attempt via hanging himself with a belt.   Upon initial interview, pt shares, "Day before I came in I had been drinking all day, and then I was drinking that whole day, and then my family must have had enough of it and they brought me in. They said I attempted suicide." Pt does not recall suicide attempt, and he denies any previous suicidal thoughts or attempts. He endorses depression prior to admission associated with stressor of separation from his wife  in the past year and recent separation from his girlfriend. He also cites feels anxious and stressed at work. He endorses guilty feelings, poor sleep, low energy, poor concentration, poor appetite, and fluctuant appetite. He denies symptoms of mania, OCD, and PTSD. He has been drinking up to 2 fifths per day of hard alcohol and he denies other illicit substance use.  Discussed with pt about treatment options. He shares that he had been started on lexapro by his PCP, and he would like to continue that medication. He also had attempted trial of Ambien CR for insomnia, but he is willing to try a different medication. He agrees to trial of vistaril for as needed treatment of anxiety and insomnia, and he will also be started on trazodone for symptoms of insomnia. Pt is willing to go to residential treatment for alcohol use, and he will discuss those options with the SW team. He will be continued on CIWA with ativan. Pt is in agreement with the above plan and he had no further questions, comments, or concerns.  Continued Clinical Symptoms:  Alcohol Use Disorder Identification Test Final Score (AUDIT): 31 The "Alcohol Use Disorders Identification Test", Guidelines for Use in Primary Care, Second Edition.  World Pharmacologist [facility]). Score between 0-7:  no or low risk or alcohol related problems. Score between 8-15:  moderate risk of alcohol related problems. Score between 16-19:  high risk of alcohol related problems. Score 20 or above:  warrants further diagnostic evaluation for alcohol dependence and treatment.   CLINICAL FACTORS:   Severe Anxiety and/or Agitation Depression:   Comorbid alcohol abuse/dependence Alcohol/Substance Abuse/Dependencies  Musculoskeletal: Strength & Muscle Tone: within normal limits Gait & Station: normal Patient leans: N/A  Psychiatric Specialty Exam: Physical Exam  Nursing note and vitals reviewed.   Review of Systems  Constitutional: Negative for chills and  fever.  Respiratory: Negative for cough and shortness of breath.   Cardiovascular: Negative for chest pain.  Gastrointestinal: Negative for abdominal pain, heartburn, nausea and vomiting.  Psychiatric/Behavioral: Negative for depression, hallucinations, substance abuse and suicidal ideas.    Blood pressure (!) 140/103, pulse 81, temperature 97.7 F (36.5 C), temperature source Oral, resp. rate 18, height 5\' 8"  (1.727 m), weight 73 kg (161 lb), SpO2 99 %.Body mass index is 24.48 kg/m.  General Appearance: Casual and Fairly Groomed  Eye Contact:  Good  Speech:  Clear and Coherent and Normal Rate  Volume:  Normal  Mood:  Depressed  Affect:  Appropriate, Congruent and Constricted  Thought Process:  Coherent and Goal Directed  Orientation:  Full (Time, Place, and Person)  Thought Content:  Logical  Suicidal Thoughts:  No  Homicidal Thoughts:  No  Memory:  Immediate;   Fair Recent;   Fair Remote;   Fair  Judgement:  Poor  Insight:  Fair  Psychomotor Activity:  Normal  Concentration:  Concentration: Fair  Recall:  [employer] of Knowledge:  Fair  Language:  Fair  Akathisia:  No  Handed:    AIMS (if indicated):     Assets:  Communication Skills Resilience  ADL's:  Intact  Cognition:  WNL  Sleep:  Number of Hours: 6.75    COGNITIVE FEATURES THAT CONTRIBUTE TO RISK:  None    SUICIDE RISK:   Minimal: No identifiable suicidal ideation.  Patients presenting with no risk factors but with morbid ruminations; may be classified as minimal risk based on the severity of the depressive symptoms  PLAN OF CARE:   -Admit to inpatient psychiatry unit  -MDD, recurrent, severe, without psychosis   - Start lexapro 10mg  po qDay  - Alcohol withdrawal   - Continue CIWA with ativan  -Anxiety   -Start atarax 50mg  po q6h prn anxiety/insomnia  - Insomnia   - Start trazodone 50mg  po qhs prn insomnia (may repeat x1)  - HTN   - Continue HCTZ 12.5mg  po qDay  - Chronic pain   - Continue  etodolac 500mg  BID   -Encourage participation in groups and therapeutic milieu  -Disposition planning will be ongoing   I certify that inpatient services furnished can reasonably be expected to improve the patient's condition.   Pennelope Bracken, MD 05/09/2017, 3:27 PM

## 2017-05-09 NOTE — H&P (Addendum)
Psychiatric Admission Assessment Adult  Patient Identification: Raymond [name]  MRN:  [id number]  Date of Evaluation:  05/09/2017  Chief Complaint: Increased alcohol consumption, worsening depression/anxiety & suicide attempt by hanging.  Principal Diagnosis: Alcohol use disorder, severe, dependence [facility]):   Patient Active Problem List   Diagnosis Date Noted  . Alcohol use disorder, severe, dependence ([place]) [F10.20] 05/09/2017  . Major depressive disorder, recurrent severe without psychotic features ([place]) [F33.2] 05/08/2017  . Alcohol abuse [F10.10]   . Thumb laceration, right, subsequent encounter [S61.011D] 08/23/2016  . Depression, major, single episode, moderate ([place]) [F32.1] 05/31/2016  . Generalized anxiety disorder [F41.1] 05/31/2016  . Melena [K92.1]   . Rectal polyp [K62.1]   . Problems with swallowing and mastication [R13.10]   . 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 HTN (hypertension) [I10] 04/06/2013  . Allergic rhinitis [J30.9] 01/18/2013  . Family history of long QT syndrome [Z82.49] 06/12/2012  . 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 Smoker [F17.200] 06/12/2012  . Common migraine [G43.009] 05/26/2012  . Fibromyalgia [M79.7] 05/26/2012   History of Present Illness: This is an admission assessment for Raymond [name], a 42 year old Caucasian male with hx of alcoholism. Admitted to the [facility] from the [facility] with a BAL of 99 & suicide attempt by hanging while intoxicated. Chart review indicated that patient's wife had caught patient with a belt around his neck. He was brought to the hospital for evaluation & treatments.  During this assessment, Raymond [name] reports, "My family brought me to the hospital on Wednesday, 2 days ago. I was drunk when they took me. They said that I was found with a belt around my neck while drunk. I don't remember any of these happening. My anxiety has been going for a long time.  I've always had bad anxiety. I'm constantly thinking, thinking, thinking & re-thinking, again & again. I have been drinking heavily since November, 2018 due to marital separation. Then, I met a new girl, which became a roller coaster relationship. We split 3 months ago, then, my anxiety worsened. I'm drinking heavily due to anxiety & to make my emotional pain go away. I drink almost 2 bottles of the fifth daily. I can say that I'm an alcoholic. I am on Lexapro for anxiety, helps a little. Would like to remain on this medicine. I'm not interested in going to a long term substance abuse treatment. Would prefer weekly meetings. I have got to go to work".  Associated Signs/Symptoms:  Depression Symptoms:  depressed mood, insomnia, feelings of worthlessness/guilt, hopelessness, anxiety,  (Hypo) Manic Symptoms:  Impulsivity,  Anxiety Symptoms:  Excessive Worry,  Psychotic Symptoms:  Denies  PTSD Symptoms: NA  Total Time spent with patient: 1 hour  Past Psychiatric History: Alcoholism, Depression & anxiety  Is the patient at risk to self? No.  Has the patient been a risk to self in the past 6 months? Yes.    Has the patient been a risk to self within the distant past? No.  Is the patient a risk to others? No.  Has the patient been a risk to others in the past 6 months? No.  Has the patient been a risk to others within the distant past? No.   Prior Inpatient Therapy: No Prior Outpatient Therapy: No  Alcohol Screening: 1. How often do you have a drink containing alcohol?: 4 or more times a week 2. How many drinks containing alcohol do you have on a typical day when you  are drinking?: 5 or 6 3. How often do you have six or more drinks on one occasion?: Daily or almost daily AUDIT-C Score: 10 4. How often during the last year have you found that you were not able to stop drinking once you had started?: Daily or almost daily 5. How often during the last year have you failed to do what was  normally expected from you becasue of drinking?: Weekly 6. How often during the last year have you needed a first drink in the morning to get yourself going after a heavy drinking session?: Weekly 7. How often during the last year have you had a feeling of guilt of remorse after drinking?: Daily or almost daily 8. How often during the last year have you been unable to remember what happened the night before because you had been drinking?: Weekly 9. Have you or someone else been injured as a result of your drinking?: No 10. Has a relative or friend or a doctor or another health worker been concerned about your drinking or suggested you cut down?: Yes, during the last year Alcohol Use Disorder Identification Test Final Score (AUDIT): 31  Substance Abuse History in the last 12 months:  Yes.     Consequences of Substance Abuse: Discussed & explained to patient. Medical Consequences:  Liver damage, Possible death by overdose Legal Consequences:  Arrests, jail time, Loss of driving privilege. Family Consequences:  Family discord, divorce and or separation.  Previous Psychotropic Medications: Yes, (Lexapro).  Psychological Evaluations: No   Past Medical History:  Past Medical History:  Diagnosis Date  . Biceps tendonitis on right 01/2014  . History of gastric ulcer    summer 2015  . Hypertension    has not been taking his medication; advised to start back on med. today  . 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 Shoulder impingement 01/2014   right    Past Surgical History:  Procedure Laterality Date  . CHOLECYSTECTOMY    . COLONOSCOPY WITH PROPOFOL N/A 05/14/2016   Procedure: COLONOSCOPY WITH PROPOFOL;  Surgeon: Lucilla Lame, MD;  Location: [facility] ENDOSCOPY;  Service: Endoscopy;  Laterality: N/A;  . ESOPHAGOGASTRODUODENOSCOPY (EGD) WITH PROPOFOL N/A 05/14/2016   Procedure: ESOPHAGOGASTRODUODENOSCOPY (EGD) WITH PROPOFOL;  Surgeon: Lucilla Lame, MD;  Location: [facility] ENDOSCOPY;  Service:  Endoscopy;  Laterality: N/A;  . RESECTION DISTAL CLAVICAL Right 02/21/2014   Procedure: RESECTION DISTAL CLAVICAL;  Surgeon: Nita Sells, MD;  Location: [place];  Service: Orthopedics;  Laterality: Right;  . SHOULDER ARTHROSCOPY WITH SUBACROMIAL DECOMPRESSION Right 02/21/2014   Procedure: SHOULDER ARTHROSCOPY WITH SUBACROMIAL DECOMPRESSION, DISTAL CLAVICAL EXCISION, DEBRIDIMENT OF PARTIAL ROTATOR CUFF TEAR;  Surgeon: Nita Sells, MD;  Location: [place];  Service: Orthopedics;  Laterality: Right;  Right shoulder arthroscopy subacromial decompression, distal clavical excision   Family History:  Family History  Problem Relation Age of Onset  . Diabetes Mother   . Heart disease Mother   . Long QT syndrome Mother   . Lung disease Father   . Diabetes Father    Family Psychiatric  History: Major depression: Father Tobacco Screening:   Social History:  Social History   Substance and Sexual Activity  Alcohol Use Yes  . Alcohol/week: 0.0 oz   Comment: 1/5 daily     Social History   Substance and Sexual Activity  Drug Use No    Additional Social History: Marital status: Married Number of Years Married: 10 What types of issues is patient  dealing with in the relationship?: recent separation where pt dated another woman and moved in with her. The relationship ended and pt is having a hard time with this. He decided to return home with his wife and children Additional relationship information: wife is supportive however pt's drinking continues to be an issue for her and the family.  Are you sexually active?: Yes What is your sexual orientation?: heterosexual Has your sexual activity been affected by drugs, alcohol, medication, or emotional stress?: n/a  Does patient have children?: Yes How many children?: 4 How is patient's relationship with their children?: 3 of the four children live with pt. 57 yr, 67 yo, 4 yo daughter, and  91yo-lives with pt's first wife. close to kids.    Allergies:   Allergies  Allergen Reactions  . Azithromycin Nausea And Vomiting  . Percocet [Oxycodone-Acetaminophen] Hives  . Vicodin [Hydrocodone-Acetaminophen] Hives  . Adhesive [Tape] Other (See Comments)    SKIN IRRITATION   Lab Results:  Results for orders placed or performed during the hospital encounter of 05/07/17 (from the past 48 hour(s))  Comprehensive metabolic panel     Status: Abnormal   Collection Time: 05/07/17  5:08 PM  Result Value Ref Range   Sodium 141 135 - 145 mmol/L   Potassium 3.2 (L) 3.5 - 5.1 mmol/L   Chloride 103 101 - 111 mmol/L   CO2 21 (L) 22 - 32 mmol/L   Glucose, Bld 125 (H) 65 - 99 mg/dL   BUN 11 6 - 20 mg/dL   Creatinine, Ser 1.02 0.61 - 1.24 mg/dL   Calcium 8.7 (L) 8.9 - 10.3 mg/dL   Total Protein 7.3 6.5 - 8.1 g/dL   Albumin 4.0 3.5 - 5.0 g/dL   AST 30 15 - 41 U/L   ALT 53 17 - 63 U/L   Alkaline Phosphatase 76 38 - 126 U/L   Total Bilirubin 0.6 0.3 - 1.2 mg/dL   GFR calc non Af Amer >60 >60 mL/min   GFR calc Af Amer >60 >60 mL/min    Comment: (NOTE) The eGFR has been calculated using the CKD EPI equation. This calculation has not been validated in all clinical situations. eGFR's persistently <60 mL/min signify possible Chronic Kidney Disease.    Anion gap 17 (H) 5 - 15    Comment: Performed at [facility], [place] [address], [place], [place] [address]  Ethanol     Status: Abnormal   Collection Time: 05/07/17  5:08 PM  Result Value Ref Range   Alcohol, Ethyl (B) 99 (H) <10 mg/dL    Comment:        LOWEST DETECTABLE LIMIT FOR SERUM ALCOHOL IS 10 mg/dL FOR MEDICAL PURPOSES ONLY Performed at [facility], [place] [address] [place], [place] [address]   Salicylate level     Status: None   Collection Time: 05/07/17  5:08 PM  Result Value Ref Range   Salicylate Lvl <8.5 2.8 - 30.0 mg/dL    Comment: Performed at [facility], [place]  [address], [place], [place] [address]  Acetaminophen level     Status: Abnormal   Collection Time: 05/07/17  5:08 PM  Result Value Ref Range   Acetaminophen (Tylenol), Serum <10 (L) 10 - 30 ug/mL    Comment:        THERAPEUTIC CONCENTRATIONS VARY SIGNIFICANTLY. A RANGE OF 10-30 ug/mL MAY BE AN EFFECTIVE CONCENTRATION FOR MANY PATIENTS. HOWEVER, SOME ARE BEST TREATED AT CONCENTRATIONS OUTSIDE THIS RANGE. ACETAMINOPHEN CONCENTRATIONS >  150 ug/mL AT 4 HOURS AFTER INGESTION AND >50 ug/mL AT 12 HOURS AFTER INGESTION ARE OFTEN ASSOCIATED WITH TOXIC REACTIONS. Performed at [facility], [place] [address] [place], [place] [address]   cbc     Status: Abnormal   Collection Time: 05/07/17  5:08 PM  Result Value Ref Range   WBC 10.0 4.0 - 10.5 K/uL   RBC 5.65 4.22 - 5.81 MIL/uL   Hemoglobin 18.8 (H) 13.0 - 17.0 g/dL   HCT 52.4 (H) 39.0 - 52.0 %   MCV 92.7 78.0 - 100.0 fL   MCH 33.3 26.0 - 34.0 pg   MCHC 35.9 30.0 - 36.0 g/dL   RDW 13.6 11.5 - 15.5 %   Platelets 228 150 - 400 K/uL    Comment: Performed at [facility], [place] [address], [place], [place] [address]  Rapid urine drug screen (hospital performed)     Status: None   Collection Time: 05/07/17  5:08 PM  Result Value Ref Range   Opiates NONE DETECTED NONE DETECTED   Cocaine NONE DETECTED NONE DETECTED   Benzodiazepines NONE DETECTED NONE DETECTED   Amphetamines NONE DETECTED NONE DETECTED   Tetrahydrocannabinol NONE DETECTED NONE DETECTED   Barbiturates NONE DETECTED NONE DETECTED    Comment: (NOTE) DRUG SCREEN FOR MEDICAL PURPOSES ONLY.  IF CONFIRMATION IS NEEDED FOR ANY PURPOSE, NOTIFY LAB WITHIN 5 DAYS. LOWEST DETECTABLE LIMITS FOR URINE DRUG SCREEN Drug Class                     Cutoff (ng/mL) Amphetamine and metabolites    1000 Barbiturate and metabolites    200 Benzodiazepine                 680 Tricyclics and metabolites     300 Opiates and metabolites        300 Cocaine and  metabolites        300 THC                            50 Performed at [facility], [place] [address] [place], [place] [address]   Lipase, blood     Status: None   Collection Time: 05/07/17  5:08 PM  Result Value Ref Range   Lipase 44 11 - 51 U/L    Comment: Performed at [facility], [place] [address] [place],  [address]   Blood Alcohol level:  Lab Results  Component Value Date   ETH 99 (H) 03/70/4888   Metabolic Disorder Labs:  No results found for: HGBA1C, MPG No results found for: PROLACTIN No results found for: CHOL, TRIG, HDL, CHOLHDL, VLDL, LDLCALC  Current Medications: Current Facility-Administered Medications  Medication Dose Route Frequency Provider Last Rate Last Dose  . cholecalciferol (VITAMIN D) tablet 1,000 Units  1,000 Units Oral Daily Lindell Spar I, NP   1,000 Units at 05/09/17 1203  . etodolac (LODINE) capsule 200 mg  200 mg Oral BID Cobos, Myer Peer, MD       And  . etodolac (LODINE) capsule 300 mg  300 mg Oral BID Cobos, Myer Peer, MD      . fluticasone (FLONASE) 50 MCG/ACT nasal spray 2 spray  2 spray Each Nare Daily Lindell Spar I, NP   2 spray at 05/09/17 1204  . hydrochlorothiazide (MICROZIDE) capsule 12.5 mg  12.5 mg Oral Daily Lindell Spar I, NP   12.5 mg at 05/09/17  1204  . loratadine (CLARITIN) tablet 10 mg  10 mg Oral Daily Lindell Spar I, NP   10 mg at 05/09/17 1203  . LORazepam (ATIVAN) tablet 1 mg  1 mg Oral TID Cobos, Myer Peer, MD   1 mg at 05/09/17 1203   Followed by  . [START ON 05/10/2017] LORazepam (ATIVAN) tablet 1 mg  1 mg Oral BID Cobos, Myer Peer, MD       Followed by  . [START ON 05/11/2017] LORazepam (ATIVAN) tablet 1 mg  1 mg Oral Daily Cobos, [name], MD      . magnesium hydroxide (MILK OF MAGNESIA) suspension 30 mL  30 mL Oral Daily PRN [name], NP      . multivitamin with minerals tablet 1 tablet  1 tablet Oral Daily Cobos, Myer Peer, MD   1 tablet at 05/09/17 [phone]  . nicotine  polacrilex (NICORETTE) gum 2 mg  2 mg Oral PRN Cobos, Myer Peer, MD   2 mg at 05/09/17 1253  . thiamine (B-1) injection 100 mg  100 mg Intramuscular Once Cobos, [name], MD      . thiamine (VITAMIN B-1) tablet 100 mg  100 mg Oral Daily Cobos, Myer Peer, MD   100 mg at 05/09/17 0808   PTA Medications: Medications Prior to Admission  Medication Sig Dispense Refill Last Dose  . amphetamine-dextroamphetamine (ADDERALL) 15 MG tablet Take 1 tablet by mouth 2 (two) times daily with a meal. (Patient taking differently: Take 15 mg by mouth 2 (two) times daily as needed (adhd). ) 60 tablet 0 unknown  . cetirizine-pseudoephedrine (ZYRTEC-D) 5-120 MG per tablet Take 1 tablet by mouth daily as needed for allergies.    unknown  . Cholecalciferol (VITAMIN D PO) Take 5,000 mg by mouth daily.   Past Month at Unknown time  . cyclobenzaprine (FLEXERIL) 10 MG tablet Take 1 tablet (10 mg total) by mouth at bedtime. (Patient not taking: Reported on 05/07/2017) 30 tablet 1 Completed Course at Unknown time  . escitalopram (LEXAPRO) 10 MG tablet Take 1 tablet (10 mg total) by mouth daily. (Patient taking differently: Take 10 mg by mouth daily as needed (depression). ) 90 tablet 1 unknown  . etodolac (LODINE) 500 MG tablet TAKE 1 TABLET BY MOUTH TWICE A DAY (Patient taking differently: TAKE 1 TABLET BY MOUTH TWICE A DAY PRN PAIN) 60 tablet 5 unknown  . fluticasone (FLONASE) 50 MCG/ACT nasal spray Place 2 sprays into both nostrils daily. (Patient taking differently: Place 2 sprays into both nostrils daily as needed for allergies. ) 16 g 5 unknown  . hydrochlorothiazide (MICROZIDE) 12.5 MG capsule TAKE ONE CAPSULE BY MOUTH EVERY DAY 90 capsule 1 Past Month at Unknown time  . lidocaine (LIDODERM) 5 % Place 1 patch onto the skin daily. Remove & Discard patch within 12 hours or as directed by MD (Patient not taking: Reported on 05/07/2017) 30 patch 0 Completed Course at Unknown time  . sildenafil (VIAGRA) 100 MG tablet Take 1  tablet (100 mg total) by mouth as needed for erectile dysfunction. 10 tablet 11 unknown  . sildenafil (VIAGRA) 100 MG tablet Take 0.5-1 tablets (50-100 mg total) by mouth daily as needed for erectile dysfunction. (Patient not taking: Reported on 05/07/2017) 30 tablet 11 Completed Course at Unknown time  . zolpidem (AMBIEN CR) 12.5 MG CR tablet TAKE 1 TABLET BY MOUTH NIGHTLY AS NEEDED (Patient taking differently: TAKE 1 TABLET BY MOUTH NIGHTLY AS NEEDED SLEEP) 30 tablet 0 Past Week at Unknown  time    Musculoskeletal: Strength & Muscle Tone: within normal limits Gait & Station: normal Patient leans: N/A  Psychiatric Specialty Exam: Physical Exam  Constitutional: He is oriented to person, place, and time. He appears well-developed.  HENT:  Head: Normocephalic.  Eyes: Pupils are equal, round, and reactive to light.  Neck: Normal range of motion.  Cardiovascular:  Elevated blood pressure (131/109). Resumed the HCTZ 12.5 mg.  Respiratory: Effort normal.  GI: Soft.  Genitourinary:  Genitourinary Comments: Deferred  Musculoskeletal: Normal range of motion.  Neurological: He is alert and oriented to person, place, and time.  Skin: Skin is warm and dry.    Review of Systems  Constitutional: Negative for chills and malaise/fatigue.  HENT: Negative.   Eyes: Negative.   Respiratory: Negative.   Cardiovascular: Negative for chest pain and palpitations.       Hx. HTN  Gastrointestinal: Negative.   Genitourinary: Negative.   Musculoskeletal: Positive for back pain and joint pain.  Skin: Negative.   Neurological: Negative.   Endo/Heme/Allergies: Negative.   Psychiatric/Behavioral: Positive for depression and substance abuse (BAL 99). Negative for hallucinations, memory loss and suicidal ideas. The patient is nervous/anxious and has insomnia.     Blood pressure (!) 140/103, pulse 81, temperature 97.7 F (36.5 C), temperature source Oral, resp. rate 18, height 5' 8" (1.727 m), weight 73 kg  (161 lb), SpO2 99 %.Body mass index is 24.48 kg/m.  General Appearance: Casual and Fairly Groomed  Eye Contact:  Good  Speech:  Clear and Coherent and Normal Rate  Volume:  Normal  Mood:  Anxious and Depressed  Affect:  Flat  Thought Process:  Coherent, Goal Directed and Descriptions of Associations: Intact  Orientation:  Full (Time, Place, and Person)  Thought Content:  Logical  Suicidal Thoughts:  Currently denies any thoughts, plans or intent.  Homicidal Thoughts:  Denies  Memory:  Immediate;   Good Recent;   Good Remote;   Fair  Judgement:  Fair  Insight:  Fair  Psychomotor Activity:  Restlessness  Concentration:  Concentration: Fair and Attention Span: Fair  Recall:  [employer] of Knowledge:  Good  Language:  Good  Akathisia:  Negative  Handed:  Right  AIMS (if indicated):     Assets:  Communication Skills Desire for Improvement Social Support  ADL's:  Intact  Cognition:  WNL  Sleep:  Number of Hours: 6.75   Treatment Plan Summary: Daily contact with patient to assess and evaluate symptoms and progress in treatment  Observation Level/Precautions:  15 minute checks  Laboratory:  Per ED, BAL 99.  Psychotherapy: Group sessions  Medications: See MAR  Consultations: As needed   Discharge Concerns: Safety, mood stability.    Estimated LOS: 2-4 days  Other: Admit to the 300 hall.     Physician Treatment Plan for Primary Diagnosis: Alcohol use disorder, severe, dependence ([place])  Long Term Goal(s): Improvement in symptoms so as ready for discharge  Short Term Goals: Ability to identify changes in lifestyle to reduce recurrence of condition will improve and Ability to demonstrate self-control will improve  Physician Treatment Plan for Secondary Diagnosis: Principal Problem:   Alcohol use disorder, severe, dependence ([place]) Active Problems:   Major depressive disorder, recurrent severe without psychotic features ([place])  Long Term Goal(s): Improvement in symptoms so as  ready for discharge  Short Term Goals: Ability to maintain clinical measurements within normal limits will improve, Compliance with prescribed medications will improve and Ability to identify triggers associated with substance abuse/mental  health issues will improve  I certify that inpatient services furnished can reasonably be expected to improve the patient's condition.    Lindell Spar, NP, PMHNP, FNP-BC. 3/22/20191:33 PM   I have reviewed NP's Note, assessement, diagnosis and plan, and agree. I have also met with patient and completed suicide risk assessment.  Raymond [name] is a 42 y/o M with history of depression and alcohol use disorder who was admitted on IVC brought in by family with worsening symptoms of depression, alcohol use, and suicide attempt via hanging himself with a belt.   Upon initial interview, pt shares, "Day before I came in I had been drinking all day, and then I was drinking that whole day, and then my family must have had enough of it and they brought me in. They said I attempted suicide." Pt does not recall suicide attempt, and he denies any previous suicidal thoughts or attempts. He endorses depression prior to admission associated with stressor of separation from his wife in the past year and recent separation from his girlfriend. He also cites feels anxious and stressed at work. He endorses guilty feelings, poor sleep, low energy, poor concentration, poor appetite, and fluctuant appetite. He denies symptoms of mania, OCD, and PTSD. He has been drinking up to 2 fifths per day of hard alcohol and he denies other illicit substance use.  Discussed with pt about treatment options. He shares that he had been started on lexapro by his PCP, and he would like to continue that medication. He also had attempted trial of Ambien CR for insomnia, but he is willing to try a different medication. He agrees to trial of vistaril for as needed treatment of anxiety and insomnia, and he will  also be started on trazodone for symptoms of insomnia. Pt is willing to go to residential treatment for alcohol use, and he will discuss those options with the SW team. He will be continued on CIWA with ativan. Pt is in agreement with the above plan and he had no further questions, comments, or concerns.  PLAN OF CARE:   -Admit to inpatient psychiatry unit  -MDD, recurrent, severe, without psychosis             - Start lexapro 33m po qDay  - Alcohol withdrawal             - Continue CIWA with ativan  -Anxiety              -Start atarax 542mpo q6h prn anxiety/insomnia  - Insomnia             - Start trazodone 5074mo qhs prn insomnia (may repeat x1)  - HTN             - Continue HCTZ 12.5mg35m qDay  - Chronic pain             - Continue etodolac 500mg65m   -Encourage participation in groups and therapeutic milieu  -Disposition planning will be ongoing   ChrisMaris Berger

## 2017-05-09 NOTE — Progress Notes (Signed)
Writer spoke with patient 1:1 and he reports that he knows that once he is discharged he needs to go to treatment or he will start back drinking. Writer asked what would keep him from going and he said himself. He reports that he has a supportive family and Probation officer encouraged him to not take advantage of his support and go ahead and do his treatment. Safety maintained on unit with 15 min checks.

## 2017-05-09 NOTE — Progress Notes (Signed)
Patient stated he needs new sleep medication.  Last night he was up most of the night rolling around.  Will staff with MD/NP today.

## 2017-05-09 NOTE — Plan of Care (Signed)
Nurse discussed depression, anxiety, coping skills with patient.  

## 2017-05-09 NOTE — Progress Notes (Signed)
PATIENT SIGNED VOLUNTARY ADMISSION FORM ON 05/09/2017 AT 1620.

## 2017-05-09 NOTE — [facility] Group Notes (Signed)
LCSW Group Therapy Note 05/09/2017 2:26 PM  Type of Therapy and Topic: Group Therapy: Feelings around Relapse and Recovery  Participation Level: Active   Description of Group:  Patients in this group will discuss emotions they experience before and after a relapse. They will process how experiencing these feelings, or avoidance of experiencing them, relates to having a relapse. Facilitator will guide patients to explore emotions they have related to recovery. Patients will be encouraged to process which emotions are more powerful. They will be guided to discuss the emotional reaction significant others in their lives may have to their relapse or recovery. Patients will be assisted in exploring ways to respond to the emotions of others without this contributing to a relapse.  Therapeutic Goals: 1. Patient will identify two or more emotions that lead to a relapse for them 2. Patient will identify two emotions that result when they relapse 3. Patient will identify two emotions related to recovery 4. Patient will demonstrate ability to communicate their needs through discussion and/or role plays  Summary of Patient Progress:  Richey was engaged and participated throughout the group session. He contributed and stated that relapse to him was "falling back into old habits". [place] reports that his separation from his wife triggered his excessive drinking. Dejay reports that a behavior he "relapsed" on was his anger issues. Rajon reports that he hopes to be accepted to a treatment program at discharge in order for him to move forward on his road to recovery.    Therapeutic Modalities:  Cognitive Behavioral Therapy Solution-Focused Therapy Assertiveness Training Relapse Prevention Therapy   Theresa Duty Clinical Social Worker

## 2017-05-09 NOTE — [facility] Group Notes (Signed)
[place] Group Notes:  (Nursing/MHT/Case Management/Adjunct)  Date:  05/09/2017  Time:  4:00 pm  Type of Therapy:  Nurse Education  Participation Level:  Active  Participation Quality:  Appropriate  Affect:  Appropriate  Cognitive:  Appropriate  Insight:  Appropriate  Engagement in Group:  Engaged  Modes of Intervention:  Education    Summary of Progress/Problems:  Patient attended and interacted appropriate with group members.    Cammy Copa 05/09/2017, 6:47 PM

## 2017-05-10 DIAGNOSIS — G894 Chronic pain syndrome: Secondary | ICD-10-CM

## 2017-05-10 DIAGNOSIS — Z87891 Personal history of nicotine dependence: Secondary | ICD-10-CM

## 2017-05-10 MED ORDER — TRAZODONE HCL 100 MG PO TABS
100.0000 mg | ORAL_TABLET | Freq: Every evening | ORAL | Status: DC | PRN
  Administered 2017-05-10 – 2017-05-12 (×6): 100 mg via ORAL
  Filled 2017-05-10 (×12): qty 1

## 2017-05-10 NOTE — Progress Notes (Addendum)
[facility] MD Progress Note  05/10/2017 10:56 AM CHOSEN [name]  MRN:  [id number]    Subjective: "I am feeling better just a little tired this morning.  Last night as the first night that have slept all the way through and today is the first day that I missed morning group; but this is the first time that I have been able to sleep and I just wanted to take advantage of it."  Objective:  Raymond [name], 42 y.o., male patient  seen face to face by this provider; chart reviewed and discussed with Dr. Rodney Cruise and treatment team on 05/10/17.  On evaluation Raymond [name]; that he slept better last night after second dose of trazodone; that he has been attending and participating in group sessions; and that he is tolerating his medications without adverse reactions.  Patient states that he is still having some withdrawal symptoms tremors in his hands, feeling fidgety, and some anxiety.  Patient rates depression 3/10, and anxiety 6/10 (0/none and 10/worse).  At this time patient denies suicidal/self-harm/homicidal ideations, psychosis, and paranoia.  During evaluation patient was alert and oriented x4; patient did not appear to be responding to internal/external stimuli, or delusional thoughts.     Principal Problem: Alcohol use disorder, severe, dependence ([place]) Diagnosis:   Patient Active Problem List   Diagnosis Date Noted  . Alcohol use disorder, severe, dependence ([place]) [F10.20] 05/09/2017  . Major depressive disorder, recurrent severe without psychotic features ([place]) [F33.2] 05/08/2017  . Alcohol abuse [F10.10]   . Thumb laceration, right, subsequent encounter [S61.011D] 08/23/2016  . Depression, major, single episode, moderate ([place]) [F32.1] 05/31/2016  . Generalized anxiety disorder [F41.1] 05/31/2016  . Melena [K92.1]   . Rectal polyp [K62.1]   . Problems with swallowing and mastication [R13.10]   . Attention deficit hyperactivity disorder (ADHD), predominantly  inattentive type [F90.0] 05/19/2014  . HTN (hypertension) [I10] 04/06/2013  . Allergic rhinitis [J30.9] 01/18/2013  . Family history of long QT syndrome [Z82.49] 06/12/2012  . 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 Smoker [F17.200] 06/12/2012  . Common migraine [G43.009] 05/26/2012  . Fibromyalgia [M79.7] 05/26/2012   Total Time spent with patient: 30 minutes  Past Psychiatric History: Alcoholism, Depression & anxiety  Past Medical History:  Past Medical History:  Diagnosis Date  . Biceps tendonitis on right 01/2014  . History of gastric ulcer    summer 2015  . Hypertension    has not been taking his medication; advised to start back on med. today  . 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 Shoulder impingement 01/2014   right    Past Surgical History:  Procedure Laterality Date  . CHOLECYSTECTOMY    . COLONOSCOPY WITH PROPOFOL N/A 05/14/2016   Procedure: COLONOSCOPY WITH PROPOFOL;  Surgeon: Lucilla Lame, MD;  Location: [facility] ENDOSCOPY;  Service: Endoscopy;  Laterality: N/A;  . ESOPHAGOGASTRODUODENOSCOPY (EGD) WITH PROPOFOL N/A 05/14/2016   Procedure: ESOPHAGOGASTRODUODENOSCOPY (EGD) WITH PROPOFOL;  Surgeon: Lucilla Lame, MD;  Location: [facility] ENDOSCOPY;  Service: Endoscopy;  Laterality: N/A;  . RESECTION DISTAL CLAVICAL Right 02/21/2014   Procedure: RESECTION DISTAL CLAVICAL;  Surgeon: Nita Sells, MD;  Location: [place];  Service: Orthopedics;  Laterality: Right;  . SHOULDER ARTHROSCOPY WITH SUBACROMIAL DECOMPRESSION Right 02/21/2014   Procedure: SHOULDER ARTHROSCOPY WITH SUBACROMIAL DECOMPRESSION, DISTAL CLAVICAL EXCISION, DEBRIDIMENT OF PARTIAL ROTATOR CUFF TEAR;  Surgeon: Nita Sells, MD;  Location: [place];  Service: Orthopedics;  Laterality: Right;  Right shoulder arthroscopy subacromial decompression, distal clavical excision   Family History:  Family History  Problem Relation Age of Onset  . Diabetes Mother    . Heart disease Mother   . Long QT syndrome Mother   . Lung disease Father   . Diabetes Father    Family Psychiatric  History: Denies Social History:  Social History   Substance and Sexual Activity  Alcohol Use Yes  . Alcohol/week: 0.0 oz   Comment: 1/5 daily     Social History   Substance and Sexual Activity  Drug Use No    Social History   Socioeconomic History  . Marital status: Married    Spouse name: Not on file  . Number of children: Not on file  . Years of education: Not on file  . Highest education level: Not on file  Occupational History  . Occupation: Programmer, systems: Programme researcher, broadcasting/film/video  Social Needs  . Financial resource strain: Not on file  . Food insecurity:    Worry: Not on file    Inability: Not on file  . Transportation needs:    Medical: Not on file    Non-medical: Not on file  Tobacco Use  . Smoking status: Former Smoker    Last attempt to quit: 09/24/2013    Years since quitting: 3.6  . Smokeless tobacco: Never Used  Substance and Sexual Activity  . Alcohol use: Yes    Alcohol/week: 0.0 oz    Comment: 1/5 daily  . Drug use: No  . Sexual activity: Yes    Partners: Female  Lifestyle  . Physical activity:    Days per week: Not on file    Minutes per session: Not on file  . Stress: Not on file  Relationships  . Social connections:    Talks on phone: Not on file    Gets together: Not on file    Attends religious service: Not on file    Active member of club or organization: Not on file    Attends meetings of clubs or organizations: Not on file    Relationship status: Not on file  Other Topics Concern  . Not on file  Social History Narrative   Mom was in hospital with long QT syndrome, then heart in [facility].   Sister has long QT syndrome   1st cousin: long QT syndrome         Additional Social History:   Sleep: Fair, [name]  Appetite:  Fair  Current Medications: Current Facility-Administered Medications  Medication Dose  Route Frequency Provider Last Rate Last Dose  . cholecalciferol (VITAMIN D) tablet 1,000 Units  1,000 Units Oral Daily Lindell Spar I, NP   1,000 Units at 05/10/17 [phone]  . escitalopram (LEXAPRO) tablet 10 mg  10 mg Oral Daily Pennelope Bracken, MD   10 mg at 05/10/17 0824  . etodolac (LODINE) capsule 200 mg  200 mg Oral BID Jaylon Boylen, Myer Peer, MD   200 mg at 05/10/17 0825   And  . etodolac (LODINE) capsule 300 mg  300 mg Oral BID Shadrack Brummitt, Myer Peer, MD   300 mg at 05/10/17 0824  . fluticasone (FLONASE) 50 MCG/ACT nasal spray 2 spray  2 spray Each Nare Daily Lindell Spar I, NP   2 spray at 05/09/17 1204  . hydrochlorothiazide (MICROZIDE) capsule 12.5 mg  12.5 mg Oral Daily Nwoko, Herbert Pun I, NP   12.5 mg at 05/10/17 0823  . hydrOXYzine (ATARAX/VISTARIL) tablet 50 mg  50  mg Oral Q6H PRN Pennelope Bracken, MD   50 mg at 05/09/17 2120  . loratadine (CLARITIN) tablet 10 mg  10 mg Oral Daily Lindell Spar I, NP   10 mg at 05/10/17 0824  . LORazepam (ATIVAN) tablet 1 mg  1 mg Oral BID Ruthene Methvin, Myer Peer, MD   1 mg at 05/10/17 6789   Followed by  . [START ON 05/11/2017] LORazepam (ATIVAN) tablet 1 mg  1 mg Oral Daily Beya Tipps A, MD      . magnesium hydroxide (MILK OF MAGNESIA) suspension 30 mL  30 mL Oral Daily PRN [name], NP      . multivitamin with minerals tablet 1 tablet  1 tablet Oral Daily Thurl Boen, Myer Peer, MD   1 tablet at 05/10/17 3810  . nicotine polacrilex (NICORETTE) gum 2 mg  2 mg Oral PRN Aianna Fahs, Myer Peer, MD   2 mg at 05/09/17 1955  . thiamine (B-1) injection 100 mg  100 mg Intramuscular Once Toshiro Hanken A, MD      . thiamine (VITAMIN B-1) tablet 100 mg  100 mg Oral Daily Kairee Kozma, Myer Peer, MD   100 mg at 05/10/17 0824  . traZODone (DESYREL) tablet 100 mg  100 mg Oral QHS,MR X 1 Rankin, Shuvon B, NP        Lab Results: No results found for this or any previous visit (from the past 48 hour(s)).  Blood Alcohol level:  Lab Results  Component Value Date   ETH  99 (H) 17/51/0258    Metabolic Disorder Labs: No results found for: HGBA1C, MPG No results found for: PROLACTIN No results found for: CHOL, TRIG, HDL, CHOLHDL, VLDL, LDLCALC  Physical Findings: AIMS:  , ,  ,  ,    CIWA:  CIWA-Ar Total: 5 COWS:     Musculoskeletal: Strength & Muscle Tone: within normal limits Gait & Station: normal Patient leans: N/A  Psychiatric Specialty Exam: Physical Exam  ROS  Blood pressure (!) 145/106, pulse 81, temperature 97.7 F (36.5 C), temperature source Oral, resp. rate 18, height 5\' 8"  (1.727 m), weight 73 kg (161 lb), SpO2 99 %.Body mass index is 24.48 kg/m.  General Appearance: Casual  Eye Contact:  Good  Speech:  Clear and Coherent and Normal Rate  Volume:  Normal  Mood:  Anxious  Affect:  Appropriate  Thought Process:  Coherent and Goal Directed  Orientation:  Full (Time, Place, and Person)  Thought Content:  Logical  Suicidal Thoughts:  No  Homicidal Thoughts:  No  Memory:  Immediate;   Good Recent;   Good Remote;   Good  Judgement:  Fair  Insight:  Fair  Psychomotor Activity:  Normal  Concentration:  Concentration: Fair and Attention Span: Fair  Recall:  Good  Fund of Knowledge:  Good  Language:  Good  Akathisia:  No  Handed:  Right  AIMS (if indicated):     Assets:  Communication Skills Desire for Improvement Social Support  ADL's:  Intact  Cognition:  WNL  Sleep:  Number of Hours: 6.75   Treatment Plan Summary: Daily contact with patient to assess and evaluate symptoms and progress in treatment and Medication management   Plan of Care  -Continue inpatient psychiatric treatment -MDD, recurrent severe, without psychosis  Continue Lexapro 10 mg daily -Alcohol withdrawal  Continue to CIWA with Ativan -Anxiety  Continue Vistaril every 6 hours as needed anxiety/insomnia -Insomnia Continue trazodone 50 mg at bedtime; may repeat dose x1  - HTN -  Continue HCTZ 12.5mg  po qDay  - Chronic  pain - Continue etodolac 500mg  BID   -Encourage participation in groups and therapeutic milieu  -Disposition planning will be ongoing   Shuvon Rankin, NP 05/10/2017, 10:56 AM   Agree with NP Progress Note

## 2017-05-10 NOTE — Progress Notes (Signed)
Writer has observed patient up in the dayroom watching tv after his visitor left. He reports having had a good day other than feeling tired. He reports that he is going for treatment after discharge because he know he will go back to drinking if he doesn't seek treatment. Writer informed him of sleep medication increased and he was aware. He attended group and was compliant with medications. Support given and safety maintained on unit with 15 min checks.

## 2017-05-10 NOTE — Progress Notes (Signed)
D:Pt rates depression as a 3 and anxiety as an 8 on 0-10 scale with 10 being the most. Pt has been out in the dayroom interacting with his peers.  A:Support and encouragement given. Medication given as ordered along with 15 minute checks.  R:Pt denies si and hi. Safety maintained on the unit.

## 2017-05-10 NOTE — [facility] Group Notes (Signed)
[facility] Group Notes: (Clinical Social Work)   05/10/2017      Type of Therapy:  Group Therapy   Participation Level:  Did Not Attend despite MHT prompting   Selmer Dominion, LCSW 05/10/2017, 1:51 PM

## 2017-05-10 NOTE — Plan of Care (Signed)
No self injurious behavior observed or expressed. 

## 2017-05-10 NOTE — [facility] Group Notes (Signed)
[place] Group Notes:  (Nursing)  Date:  05/10/2017  Time:  1:30 pm Type of Therapy:  Nurse Education  Participation Level:  Active  Participation Quality:  Appropriate  Affect:  Appropriate  Cognitive:  Alert and Appropriate  Insight:  Appropriate  Engagement in Group:  Engaged  Modes of Intervention:  Discussion and Education  Summary of Progress/Problems: Patient actively engaged in group  Waymond Cera 05/10/2017, 2:32 PM

## 2017-05-10 NOTE — Progress Notes (Signed)
Adult Psychoeducational Group Note  Date:  05/10/2017 Time:  9:58 PM  Group Topic/Focus:  Wrap-Up Group:   The focus of this group is to help patients review their daily goal of treatment and discuss progress on daily workbooks.  Participation Level:  Active  Participation Quality:  Appropriate  Affect:  Appropriate  Cognitive:  Appropriate  Insight: Appropriate  Engagement in Group:  Engaged  Modes of Intervention:  Discussion  Additional Comments:  Patient attended [place] group meeting,  Kamie Korber W Indiya Izquierdo 9/93/5701, 9:58 PM

## 2017-05-11 DIAGNOSIS — F1020 Alcohol dependence, uncomplicated: Secondary | ICD-10-CM

## 2017-05-11 DIAGNOSIS — F419 Anxiety disorder, unspecified: Secondary | ICD-10-CM

## 2017-05-11 DIAGNOSIS — I10 Essential (primary) hypertension: Secondary | ICD-10-CM

## 2017-05-11 DIAGNOSIS — Z79899 Other long term (current) drug therapy: Secondary | ICD-10-CM

## 2017-05-11 DIAGNOSIS — G8929 Other chronic pain: Secondary | ICD-10-CM

## 2017-05-11 DIAGNOSIS — G4700 Insomnia, unspecified: Secondary | ICD-10-CM

## 2017-05-11 MED ORDER — NICOTINE 21 MG/24HR TD PT24
21.0000 mg | MEDICATED_PATCH | Freq: Every day | TRANSDERMAL | Status: DC
  Administered 2017-05-11 – 2017-05-14 (×4): 21 mg via TRANSDERMAL
  Filled 2017-05-11 (×7): qty 1

## 2017-05-11 MED ORDER — ESCITALOPRAM OXALATE 20 MG PO TABS
20.0000 mg | ORAL_TABLET | Freq: Every day | ORAL | Status: DC
Start: 2017-05-12 — End: 2017-05-14
  Administered 2017-05-12 – 2017-05-14 (×3): 20 mg via ORAL
  Filled 2017-05-11 (×5): qty 1

## 2017-05-11 NOTE — Progress Notes (Signed)
D: Patient continues to report some withdrawal symptoms such as tremors, diarrhea, cramping and irritability.  He is tolerating the ativan protocol well.  He states his sleep has improve, however, "I keep getting up in the night, but it is getting better."  He denies any thoughts of self harm.  He forwards little with staff.  He did not attend MHT Orientation Group this morning.  His goal today is to "be lazy."  Patient wants to continue treatment after discharge for his alcohol abuse.    A: Continue to monitor medication management and MD orders.  Safety checks completed every 15 minutes per protocol.  Offer support and encouragement as needed.  R: Patient is receptive to staff; his behavior is appropriate.

## 2017-05-11 NOTE — [facility] Group Notes (Signed)
[place] Group Notes:  (Nursing)  Date:  05/11/2017  Time:1:15 PM Type of Therapy:  Nurse Education  Participation Level:  Active  Participation Quality:  Appropriate  Affect:  Appropriate  Cognitive:  Alert and Appropriate  Insight:  Appropriate and Good  Engagement in Group:  Engaged  Modes of Intervention:  Discussion and Education  Summary of Progress/Problems: Patient actively engaged in Life Skills group led by RN Waymond Cera 05/11/2017, 3:23 PM

## 2017-05-11 NOTE — Progress Notes (Signed)
Patient has been up in the dayroom tonight watching tv and interacting with peers. He has his brother to visit on tonight. He attended group this evening and reports having had a good day. He was compliant with scheduled medications and voiced no complaints. Patient currently denies having pain, -si/hi/a/v hall. Support and encouragement offered, safety maintained on unit, will continue to monitor.

## 2017-05-11 NOTE — Tx Team (Signed)
Initial Treatment Plan 05/11/2017 10:27 PM [facility] PRX:[id number]    PATIENT STRESSORS: Marital or family conflict Substance abuse   PATIENT STRENGTHS: Ability for insight Motivation for treatment/growth Supportive family/friends   PATIENT IDENTIFIED PROBLEMS: ETOH abuse  Marital conflict  SI  Depressioni      " I want to go to a treatment center for help or I will continue to drink once discharged."         DISCHARGE CRITERIA:  Improved stabilization in mood, thinking, and/or behavior Withdrawal symptoms are absent or subacute and managed without 24-hour nursing intervention  PRELIMINARY DISCHARGE PLAN: Return to previous living arrangement  PATIENT/FAMILY INVOLVEMENT: This treatment plan has been presented to and reviewed with the patient, Raymond [name], and/or family member.  The patient and family have been given the opportunity to ask questions and make suggestions.  Aurora Mask, RN 05/11/2017, 10:27 PM

## 2017-05-11 NOTE — Progress Notes (Signed)
Pt attended AA meeting this evening.  

## 2017-05-11 NOTE — Progress Notes (Signed)
[facility] MD Progress Note  05/11/2017 10:25 AM Raymond [name]  MRN:  [id number]    Subjective: "I am craving for Nicotine and withdrawing from alcohol, I am tired, still shaking, tremors of hands, mild nausea.  He has slept good and even sleeping this morning after breakfast."    Objective:  Raymond [name], 42 y.o., male patient  seen face to face by this MD, chart reviewed and discussed with staff RN on 05/11/17.  On evaluation Raymond [name] stated that he is feeling better since his medication treatment and groups therapies are working as expected. He has been tired due to fibromyalgia and lack sleep and concern about symptoms of nicotine craving and alchol withdrawal like nausea and hand tremors. He has denied disturbance of sleep and appetite. He has been attending and participating in group sessions. Patient agree to take nicotine patch instead of nicotine gum which required to seek every two hours and agree for titration of his antidepressant medication. He is tolerating his medications without adverse reactions. Patient rates depression 4/10, and anxiety 8/10 (10/worse).  At this time patient denies suicidal/self-harm/homicidal ideations, psychosis, and paranoia.       Principal Problem: Alcohol use disorder, severe, dependence ([place]) Diagnosis:   Patient Active Problem List   Diagnosis Date Noted  . Alcohol use disorder, severe, dependence ([place]) [F10.20] 05/09/2017  . Major depressive disorder, recurrent severe without psychotic features ([place]) [F33.2] 05/08/2017  . Alcohol abuse [F10.10]   . Thumb laceration, right, subsequent encounter [S61.011D] 08/23/2016  . Depression, major, single episode, moderate ([place]) [F32.1] 05/31/2016  . Generalized anxiety disorder [F41.1] 05/31/2016  . Melena [K92.1]   . Rectal polyp [K62.1]   . Problems with swallowing and mastication [R13.10]   . 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 HTN  (hypertension) [I10] 04/06/2013  . Allergic rhinitis [J30.9] 01/18/2013  . Family history of long QT syndrome [Z82.49] 06/12/2012  . 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 Smoker [F17.200] 06/12/2012  . Common migraine [G43.009] 05/26/2012  . Fibromyalgia [M79.7] 05/26/2012   Total Time spent with patient: 30 minutes  Past Psychiatric History: Alcoholism, Depression & anxiety  Past Medical History:  Past Medical History:  Diagnosis Date  . Biceps tendonitis on right 01/2014  . History of gastric ulcer    summer 2015  . Hypertension    has not been taking his medication; advised to start back on med. today  . 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 Shoulder impingement 01/2014   right    Past Surgical History:  Procedure Laterality Date  . CHOLECYSTECTOMY    . COLONOSCOPY WITH PROPOFOL N/A 05/14/2016   Procedure: COLONOSCOPY WITH PROPOFOL;  Surgeon: Lucilla Lame, MD;  Location: [facility] ENDOSCOPY;  Service: Endoscopy;  Laterality: N/A;  . ESOPHAGOGASTRODUODENOSCOPY (EGD) WITH PROPOFOL N/A 05/14/2016   Procedure: ESOPHAGOGASTRODUODENOSCOPY (EGD) WITH PROPOFOL;  Surgeon: Lucilla Lame, MD;  Location: [facility] ENDOSCOPY;  Service: Endoscopy;  Laterality: N/A;  . RESECTION DISTAL CLAVICAL Right 02/21/2014   Procedure: RESECTION DISTAL CLAVICAL;  Surgeon: Nita Sells, MD;  Location: [place];  Service: Orthopedics;  Laterality: Right;  . SHOULDER ARTHROSCOPY WITH SUBACROMIAL DECOMPRESSION Right 02/21/2014   Procedure: SHOULDER ARTHROSCOPY WITH SUBACROMIAL DECOMPRESSION, DISTAL CLAVICAL EXCISION, DEBRIDIMENT OF PARTIAL ROTATOR CUFF TEAR;  Surgeon: Nita Sells, MD;  Location: [place];  Service: Orthopedics;  Laterality: Right;  Right shoulder arthroscopy subacromial decompression, distal clavical excision   Family History:  Family History  Problem Relation Age of Onset  . Diabetes Mother   . Heart disease Mother   . Long QT  syndrome Mother   . Lung disease Father   . Diabetes Father    Family Psychiatric  History: Denies Social History:  Social History   Substance and Sexual Activity  Alcohol Use Yes  . Alcohol/week: 0.0 oz   Comment: 1/5 daily     Social History   Substance and Sexual Activity  Drug Use No    Social History   Socioeconomic History  . Marital status: Married    Spouse name: Not on file  . Number of children: Not on file  . Years of education: Not on file  . Highest education level: Not on file  Occupational History  . Occupation: Programmer, systems: Programme researcher, broadcasting/film/video  Social Needs  . Financial resource strain: Not on file  . Food insecurity:    Worry: Not on file    Inability: Not on file  . Transportation needs:    Medical: Not on file    Non-medical: Not on file  Tobacco Use  . Smoking status: Former Smoker    Last attempt to quit: 09/24/2013    Years since quitting: 3.6  . Smokeless tobacco: Never Used  Substance and Sexual Activity  . Alcohol use: Yes    Alcohol/week: 0.0 oz    Comment: 1/5 daily  . Drug use: No  . Sexual activity: Yes    Partners: Female  Lifestyle  . Physical activity:    Days per week: Not on file    Minutes per session: Not on file  . Stress: Not on file  Relationships  . Social connections:    Talks on phone: Not on file    Gets together: Not on file    Attends religious service: Not on file    Active member of club or organization: Not on file    Attends meetings of clubs or organizations: Not on file    Relationship status: Not on file  Other Topics Concern  . Not on file  Social History Narrative   Mom was in hospital with long QT syndrome, then heart in A Fib.   Sister has long QT syndrome   1st cousin: long QT syndrome         Additional Social History:   Sleep: Fair, improving  Appetite:  Fair  Current Medications: Current Facility-Administered Medications  Medication Dose Route Frequency Provider Last Rate  Last Dose  . cholecalciferol (VITAMIN D) tablet 1,000 Units  1,000 Units Oral Daily Lindell Spar I, NP   1,000 Units at 05/11/17 0816  . [START ON 05/12/2017] escitalopram (LEXAPRO) tablet 20 mg  20 mg Oral Daily Anikah Hogge, Arbutus Ped, MD      . etodolac (LODINE) capsule 200 mg  200 mg Oral BID Cobos, Myer Peer, MD   200 mg at 05/11/17 0815   And  . etodolac (LODINE) capsule 300 mg  300 mg Oral BID Cobos, Myer Peer, MD   300 mg at 05/11/17 0815  . fluticasone (FLONASE) 50 MCG/ACT nasal spray 2 spray  2 spray Each Nare Daily Lindell Spar I, NP   2 spray at 05/09/17 1204  . hydrochlorothiazide (MICROZIDE) capsule 12.5 mg  12.5 mg Oral Daily Nwoko, Agnes I, NP   12.5 mg at 05/11/17 0816  . hydrOXYzine (ATARAX/VISTARIL) tablet 50 mg  50 mg Oral Q6H PRN Pennelope Bracken, MD   50 mg at 05/10/17 1944  .  loratadine (CLARITIN) tablet 10 mg  10 mg Oral Daily Lindell Spar I, NP   10 mg at 05/11/17 0816  . magnesium hydroxide (MILK OF MAGNESIA) suspension 30 mL  30 mL Oral Daily PRN [name], NP      . multivitamin with minerals tablet 1 tablet  1 tablet Oral Daily Cobos, Myer Peer, MD   1 tablet at 05/11/17 0815  . nicotine (NICODERM CQ - dosed in mg/24 hours) patch 21 mg  21 mg Transdermal Daily Ambrose Finland, MD      . thiamine (B-1) injection 100 mg  100 mg Intramuscular Once Cobos, [name], MD      . thiamine (VITAMIN B-1) tablet 100 mg  100 mg Oral Daily Cobos, Myer Peer, MD   100 mg at 05/11/17 0815  . traZODone (DESYREL) tablet 100 mg  100 mg Oral QHS,MR X 1 Rankin, Shuvon B, NP   100 mg at 05/10/17 2237    Lab Results: No results found for this or any previous visit (from the past 48 hour(s)).  Blood Alcohol level:  Lab Results  Component Value Date   ETH 99 (H) 54/62/7035    Metabolic Disorder Labs: No results found for: HGBA1C, MPG No results found for: PROLACTIN No results found for: CHOL, TRIG, HDL, CHOLHDL, VLDL, LDLCALC  Physical Findings: AIMS:   , ,  ,  ,    CIWA:  CIWA-Ar Total: 0 COWS:     Musculoskeletal: Strength & Muscle Tone: within normal limits Gait & Station: normal Patient leans: N/A  Psychiatric Specialty Exam: Physical Exam  ROS  Blood pressure (!) 126/97, pulse 97, temperature 97.7 F (36.5 C), temperature source Oral, resp. rate 16, height 5\' 8"  (1.727 m), weight 73 kg (161 lb), SpO2 99 %.Body mass index is 24.48 kg/m.  General Appearance: Casual  Eye Contact:  Good  Speech:  Clear and Coherent and Normal Rate  Volume:  Normal  Mood:  Anxious - less anxious and more tired today  Affect:  Appropriate  Thought Process:  Coherent and Goal Directed  Orientation:  Full (Time, Place, and Person)  Thought Content:  Logical  Suicidal Thoughts:  No, denied  Homicidal Thoughts:  No, denied  Memory:  Immediate;   Good Recent;   Good Remote;   Good  Judgement:  Fair  Insight:  Fair  Psychomotor Activity:  Normal  Concentration:  Concentration: Fair and Attention Span: Fair  Recall:  Good  Fund of Knowledge:  Good  Language:  Good  Akathisia:  No  Handed:  Right  AIMS (if indicated):     Assets:  Communication Skills Desire for Improvement Social Support  ADL's:  Intact  Cognition:  WNL  Sleep:  Number of Hours: 6.25   Treatment Plan Summary: Daily contact with patient to assess and evaluate symptoms and progress in treatment and Medication management   Plan of Care  -Continue inpatient psychiatric treatment -MDD, recurrent severe, without psychosis  Increase Lexapro 20 mg daily starting tomorrow -Alcohol withdrawal  Continue to CIWA with Ativan -Anxiety  Continue Vistaril every 6 hours as needed anxiety/insomnia -Insomnia Continue trazodone 50 mg at bedtime; may repeat dose x1  - HTN - Continue HCTZ 12.5mg  po qDay  - Chronic pain - Continue etodolac 500mg  BID   -Encourage participation in groups and therapeutic milieu  -Disposition planning will be  ongoing   Ambrose Finland, MD 05/11/2017, 10:25 AM

## 2017-05-11 NOTE — [facility] Group Notes (Signed)
[facility] LCSW Group Therapy Note  Date/Time:  05/11/2017 9:00-10:00 or 10:00-11:00AM  Type of Therapy and Topic:  Group Therapy:  Healthy and Unhealthy Supports  Participation Level:  Did Not Attend   Description of Group:  Patients in this group were introduced to the idea of adding a variety of healthy supports to address the various needs in their lives.Patients discussed what additional healthy supports could be helpful in their recovery and wellness after discharge in order to prevent future hospitalizations.   An emphasis was placed on using counselor, doctor, therapy groups, 12-step groups, and problem-specific support groups to expand supports.  They also worked as a group on developing a specific plan for several patients to deal with unhealthy supports through [place], psychoeducation with loved ones, and even termination of relationships.   Therapeutic Goals:   1)  discuss importance of adding supports to stay well once out of the hospital  2)  compare healthy versus unhealthy supports and identify some examples of each  3)  generate ideas and descriptions of healthy supports that can be added  4)  offer mutual support about how to address unhealthy supports  5)  encourage active participation in and adherence to discharge plan    Summary of Patient Progress:  n/a   Therapeutic Modalities:   [place], LCSW

## 2017-05-11 NOTE — [facility] Group Notes (Signed)
Pt did not attend Orientation/Goals group this morning.

## 2017-05-11 NOTE — Plan of Care (Signed)
  Problem: Education: Goal: Emotional status will improve Outcome: Progressing   Problem: Activity: Goal: Sleeping patterns will improve Outcome: Progressing   Problem: Physical Regulation: Goal: Ability to maintain clinical measurements within normal limits will improve Outcome: Progressing   Problem: Self-Concept: Goal: Level of anxiety will decrease Outcome: Progressing   Problem: Education: Goal: Knowledge of disease or condition will improve Outcome: Progressing Goal: Understanding of discharge needs will improve Outcome: Progressing   Patient has been encouraged to talk with staff regarding his goals for discharge.  He understands that he needs further treatment upon discharge and is willing to attain that goal.

## 2017-05-12 MED ORDER — HYDROXYZINE HCL 25 MG PO TABS
25.0000 mg | ORAL_TABLET | ORAL | Status: DC | PRN
  Administered 2017-05-12 – 2017-05-13 (×2): 25 mg via ORAL
  Filled 2017-05-12: qty 30
  Filled 2017-05-12 (×2): qty 1

## 2017-05-12 MED ORDER — POTASSIUM CHLORIDE CRYS ER 20 MEQ PO TBCR
20.0000 meq | EXTENDED_RELEASE_TABLET | Freq: Every day | ORAL | Status: DC
  Administered 2017-05-12 – 2017-05-14 (×3): 20 meq via ORAL
  Filled 2017-05-12 (×5): qty 1

## 2017-05-12 MED ORDER — ARIPIPRAZOLE 5 MG PO TABS
5.0000 mg | ORAL_TABLET | Freq: Every day | ORAL | Status: DC
  Administered 2017-05-12 – 2017-05-14 (×3): 5 mg via ORAL
  Filled 2017-05-12 (×5): qty 1

## 2017-05-12 MED ORDER — HYDROCHLOROTHIAZIDE 12.5 MG PO CAPS
12.5000 mg | ORAL_CAPSULE | Freq: Once | ORAL | Status: AC
  Administered 2017-05-12: 12.5 mg via ORAL
  Filled 2017-05-12: qty 1

## 2017-05-12 MED ORDER — HYDROCHLOROTHIAZIDE 25 MG PO TABS
25.0000 mg | ORAL_TABLET | Freq: Every day | ORAL | Status: DC
  Administered 2017-05-13 – 2017-05-14 (×2): 25 mg via ORAL
  Filled 2017-05-12 (×4): qty 1

## 2017-05-12 NOTE — Progress Notes (Addendum)
D:  Patient's self inventory sheet, patient has fair sleep, sleep medication helpful.  Good appetite, low energy level, good concentration.  Rated depression 4, denied hopeless, anxiety 8.  Withdrawals, tremors.  Denied SI.  Denied physical problems.  Denied physical pain.  No discharge plans. A:  Medications administered per MD orders.  Emotional support and encouragement given patient. R:  Denied SI and HI, contracts for safety.  Denied A/V hallucinations.  Safety maintained with 15 minute checks.  Patient has complained of racing thoughts today to some of the staff.

## 2017-05-12 NOTE — Progress Notes (Signed)
[facility] MD Progress Note  05/12/2017 1:26 PM Raymond [name]  MRN:  [id number]   Subjective: Raymond [name] reports, "I'm doing pretty good, better than I was when I first got here. I was drinking heavily, never in touch with reality. I'm no longer blacking out. I know what is going on. However, I still have the anxiety, it is bad. I worry a lot about a lot of stuff, family, work & my drinking problem. I'm constantly thinking about stuff, what I should be or could be doing & I'm not doing it or doing a lot of it. Bad depression runs in my family. My father, brother & sister were committed at the psychiatric ward one time or the other. I don't have suicidal, homicidal thoughts. But, I'm depressed a lot".  Objective: Raymond [name] is a 42 y/o M with history of depression and alcohol use disorder who was admitted on IVC brought in by family with worsening symptoms of depression, alcohol use, and suicide attempt via hanging himself with a belt.  05-12-17, Raymond [name] is seen. Chart reviewed. The chart findings discussed with the treatment team. He presents alert, oriented x 3 & aware of situation. He is visible on the unit, participating in the group counseling sessions. He reports today feeling a lot better than when he first got to the hospital. However, he continues to endorse racing thoughts. He is noted ruminating about life situaions. He is also waiting to hear if he has been accepted at the [place] center to continue substance abuse treatment after discharge. His Lexapro was increased yesterday. Today, we added Abilify 5 mg po q daily for mood control. He denies any SIHI, AVH, delusional thoughts or paranoia.  Principal Problem: Alcohol use disorder, severe, dependence ([place])  Diagnosis:   Patient Active Problem List   Diagnosis Date Noted  . Alcohol use disorder, severe, dependence ([place]) [F10.20] 05/09/2017  . Major depressive disorder, recurrent severe without psychotic features ([place]) [F33.2] 05/08/2017   . Alcohol abuse [F10.10]   . Thumb laceration, right, subsequent encounter [S61.011D] 08/23/2016  . Depression, major, single episode, moderate ([place]) [F32.1] 05/31/2016  . Generalized anxiety disorder [F41.1] 05/31/2016  . Melena [K92.1]   . Rectal polyp [K62.1]   . Problems with swallowing and mastication [R13.10]   . 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 HTN (hypertension) [I10] 04/06/2013  . Allergic rhinitis [J30.9] 01/18/2013  . Family history of long QT syndrome [Z82.49] 06/12/2012  . 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 Smoker [F17.200] 06/12/2012  . Common migraine [G43.009] 05/26/2012  . Fibromyalgia [M79.7] 05/26/2012   Total Time spent with patient: 30 minutes  Past Psychiatric History: Alcoholism, Depression & anxiety  Past Medical History:  Past Medical History:  Diagnosis Date  . Biceps tendonitis on right 01/2014  . History of gastric ulcer    summer 2015  . Hypertension    has not been taking his medication; advised to start back on med. today  . 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 Shoulder impingement 01/2014   right    Past Surgical History:  Procedure Laterality Date  . CHOLECYSTECTOMY    . COLONOSCOPY WITH PROPOFOL N/A 05/14/2016   Procedure: COLONOSCOPY WITH PROPOFOL;  Surgeon: Lucilla Lame, MD;  Location: [facility] ENDOSCOPY;  Service: Endoscopy;  Laterality: N/A;  . ESOPHAGOGASTRODUODENOSCOPY (EGD) WITH PROPOFOL N/A 05/14/2016   Procedure: ESOPHAGOGASTRODUODENOSCOPY (EGD) WITH PROPOFOL;  Surgeon: Lucilla Lame, MD;  Location: [facility] END[facility]COPY;  Service: Endoscopy;  Laterality: N/A;  .  RESECTION DISTAL CLAVICAL Right 02/21/2014   Procedure: RESECTION DISTAL CLAVICAL;  Surgeon: Nita Sells, MD;  Location: [place];  Service: Orthopedics;  Laterality: Right;  . SHOULDER ARTHROSCOPY WITH SUBACROMIAL DECOMPRESSION Right 02/21/2014   Procedure: SHOULDER  ARTHROSCOPY WITH SUBACROMIAL DECOMPRESSION, DISTAL CLAVICAL EXCISION, DEBRIDIMENT OF PARTIAL ROTATOR CUFF TEAR;  Surgeon: Nita Sells, MD;  Location: [place];  Service: Orthopedics;  Laterality: Right;  Right shoulder arthroscopy subacromial decompression, distal clavical excision   Family History:  Family History  Problem Relation Age of Onset  . Diabetes Mother   . Heart disease Mother   . Long QT syndrome Mother   . Lung disease Father   . Diabetes Father    Family Psychiatric  History: Denies Social History:  Social History   Substance and Sexual Activity  Alcohol Use Yes  . Alcohol/week: 0.0 oz   Comment: 1/5 daily     Social History   Substance and Sexual Activity  Drug Use No    Social History   Socioeconomic History  . Marital status: Married    Spouse name: Not on file  . Number of children: Not on file  . Years of education: Not on file  . Highest education level: Not on file  Occupational History  . Occupation: Programmer, systems: Programme researcher, broadcasting/film/video  Social Needs  . Financial resource strain: Not on file  . Food insecurity:    Worry: Not on file    Inability: Not on file  . Transportation needs:    Medical: Not on file    Non-medical: Not on file  Tobacco Use  . Smoking status: Former Smoker    Last attempt to quit: 09/24/2013    Years since quitting: 3.6  . Smokeless tobacco: Never Used  Substance and Sexual Activity  . Alcohol use: Yes    Alcohol/week: 0.0 oz    Comment: 1/5 daily  . Drug use: No  . Sexual activity: Yes    Partners: Female  Lifestyle  . Physical activity:    Days per week: Not on file    Minutes per session: Not on file  . Stress: Not on file  Relationships  . Social connections:    Talks on phone: Not on file    Gets together: Not on file    Attends religious service: Not on file    Active member of club or organization: Not on file    Attends meetings of clubs or organizations: Not on file     Relationship status: Not on file  Other Topics Concern  . Not on file  Social History Narrative   Mom was in hospital with long QT syndrome, then heart in A Fib.   Sister has long QT syndrome   1st cousin: long QT syndrome         Additional Social History:   Sleep: Fair, improving  Appetite:  Fair  Current Medications: Current Facility-Administered Medications  Medication Dose Route Frequency Provider Last Rate Last Dose  . ARIPiprazole (ABILIFY) tablet 5 mg  5 mg Oral Daily Nwoko, Agnes I, NP      . cholecalciferol (VITAMIN D) tablet 1,000 Units  1,000 Units Oral Daily Lindell Spar I, NP   1,000 Units at 05/12/17 0759  . escitalopram (LEXAPRO) tablet 20 mg  20 mg Oral Daily Ambrose Finland, MD   20 mg at 05/12/17 0759  . etodolac (LODINE) capsule 200 mg  200 mg Oral BID Cobos, Myer Peer,  MD   200 mg at 05/12/17 0759   And  . etodolac (LODINE) capsule 300 mg  300 mg Oral BID Cobos, Myer Peer, MD   300 mg at 05/12/17 0759  . fluticasone (FLONASE) 50 MCG/ACT nasal spray 2 spray  2 spray Each Nare Daily Lindell Spar I, NP   2 spray at 05/09/17 1204  . hydrochlorothiazide (MICROZIDE) capsule 12.5 mg  12.5 mg Oral Daily Nwoko, Agnes I, NP   12.5 mg at 05/12/17 0800  . hydrOXYzine (ATARAX/VISTARIL) tablet 50 mg  50 mg Oral Q6H PRN Pennelope Bracken, MD   50 mg at 05/11/17 2125  . loratadine (CLARITIN) tablet 10 mg  10 mg Oral Daily Lindell Spar I, NP   10 mg at 05/12/17 0801  . magnesium hydroxide (MILK OF MAGNESIA) suspension 30 mL  30 mL Oral Daily PRN [name], NP      . multivitamin with minerals tablet 1 tablet  1 tablet Oral Daily Cobos, Myer Peer, MD   1 tablet at 05/12/17 0801  . nicotine (NICODERM CQ - dosed in mg/24 hours) patch 21 mg  21 mg Transdermal Daily Ambrose Finland, MD   21 mg at 05/12/17 0803  . thiamine (B-1) injection 100 mg  100 mg Intramuscular Once Cobos, [name], MD      . thiamine (VITAMIN B-1) tablet 100 mg  100 mg  Oral Daily Cobos, Myer Peer, MD   100 mg at 05/12/17 0801  . traZODone (DESYREL) tablet 100 mg  100 mg Oral QHS,MR X 1 Rankin, Shuvon B, NP   100 mg at 05/11/17 2159   Lab Results: No results found for this or any previous visit (from the past 48 hour(s)).  Blood Alcohol level:  Lab Results  Component Value Date   ETH 99 (H) 50/53/9767   Metabolic Disorder Labs: No results found for: HGBA1C, MPG No results found for: PROLACTIN No results found for: CHOL, TRIG, HDL, CHOLHDL, VLDL, LDLCALC  Physical Findings: AIMS:  , ,  ,  ,    CIWA:  CIWA-Ar Total: 0 COWS:     Musculoskeletal: Strength & Muscle Tone: within normal limits Gait & Station: normal Patient leans: N/A  Psychiatric Specialty Exam: Physical Exam  Nursing note and vitals reviewed.   Review of Systems  Psychiatric/Behavioral: Positive for depression (Some improvement noted) and substance abuse (Hx. alcoholism). Negative for hallucinations, memory loss and suicidal ideas. The patient is nervous/anxious (Ruminations, racing thoughts.) and has insomnia (Improving).     Blood pressure (!) 136/104, pulse 95, temperature 98.8 F (37.1 C), temperature source Oral, resp. rate 16, height 5\' 8"  (1.727 m), weight 73 kg (161 lb), SpO2 99 %.Body mass index is 24.48 kg/m.  General Appearance: Casual  Eye Contact:  Good  Speech:  Clear and Coherent and Normal Rate  Volume:  Normal  Mood:  Anxious   Affect:  Appropriate  Thought Process:  Coherent and Goal Directed  Orientation:  Full (Time, Place, and Person)  Thought Content:  Logical  Suicidal Thoughts:  Denies,   Homicidal Thoughts:  Denies,  Memory:  Immediate;   Good Recent;   Good Remote;   Good  Judgement:  Fair  Insight:  Fair  Psychomotor Activity:  Normal  Concentration:  Concentration: Fair and Attention Span: Fair  Recall:  Good  Fund of Knowledge:  Good  Language:  Good  Akathisia:  No  Handed:  Right  AIMS (if indicated):     Assets:  Communication  Skills  Desire for Improvement Social Support  ADL's:  Intact  Cognition:  WNL  Sleep:  Number of Hours: 6.75   Treatment Plan Summary: Daily contact with patient to assess and evaluate symptoms and progress in treatment and Medication management : Patient is complaining of racing thoughts while ruminating about issues in his life. He continues to require mood stabilization treatment.  - Continue inpatient psychiatric treatment.  - Will continue today 05/12/2017 plan as below except where it is noted.  Mood control/adjunct tx for depression. - Initiated Abilify 5 mg po daily.  -MDD, recurrent severe, without psychosis  Continue Lexapro 20 mg Po daily.  -Alcohol withdrawal  Discontinue CIWA with Ativan, no longer having alcohol withdrawal symptoms.  -Anxiety  Continue Vistaril every 6 hours as needed anxiety/insomnia.  -Insomnia Continue trazodone 50 mg at bedtime; may repeat dose x1  - HTN - Continue HCTZ 12.5mg  po qDay  - Chronic pain - Continue etodolac 500mg  BID   -Encourage participation in groups and therapeutic milieu  -Disposition planning will be ongoing  Lindell Spar, NP, pmhnp, fnp-bc 05/12/2017, 1:26 PM  Patient ID: [name], male   DOB: 09-Jul-1975, 42 y.o.   MRN: [id number]

## 2017-05-12 NOTE — Plan of Care (Signed)
Nurse discussed depression, anxiety, coping skills with patient.  

## 2017-05-12 NOTE — Progress Notes (Signed)
Patient ID: Raymond [name], male   DOB: 07-19-1975, 41 y.o.   MRN: [id number]  Pt currently presents with a blunted affect and restless behavior. Interaction with patient is superficial. Pt reports to writer that their goal is to "just get otu of here, I have been here so long." Pt states tearfully "My cravings for alcohol are at a 5." Reports hope that rehab will be successful. Pt reports good sleep with current medication regimen. Pt HTN, reports feeling like his heart is racing.   Pt provided with medications per providers orders. Pt's labs and vitals were monitored throughout the night. Pt given a 1:1 about emotional and mental status. Pt supported and encouraged to express concerns and questions. Pt educated on medications and HTN.   Pt's safety ensured with 15 minute and environmental checks. Pt currently denies SI/HI and A/V hallucinations. Pt verbally agrees to seek staff if SI/HI or A/VH occurs and to consult with staff before acting on any harmful thoughts. Will continue POC.

## 2017-05-12 NOTE — Progress Notes (Signed)
Recreation Therapy Notes  Date: 3.25.19 Time: 9:30 a.m. Location: [address] Dayroom   Group Topic: Stress Management   Goal Area(s) Addresses:  Goal 1.1: To reduce stress  -Patient will report feeling a reduction in stress level  -Patient will identify the importance of stress management  -Patient will participate during stress management group treatment     Intervention: Stress Management   Activity: Meditattion- Patients were in a peaceful environment with soft lighting enhancing patients mood. Patients listened to a deep concentration voice over to decrease stress levels   Education: Stress Management, Discharge Planning.    Education Outcome: Acknowledges edcuation/In group clarification offered/Needs additional education   Clinical Observations/Feedback:: Patient did not attend     Ranell Patrick, Recreation Therapy Intern   Ranell Patrick 05/12/2017 8:27 AM

## 2017-05-12 NOTE — BHH Group Notes (Signed)
LCSW Group Therapy Note 05/12/2017 2:38 PM  Type of Therapy and Topic: Group Therapy: Overcoming Obstacles  Participation Level: Active  Description of Group:  In this group patients will be encouraged to explore what they see as obstacles to their own wellness and recovery. They will be guided to discuss their thoughts, feelings, and behaviors related to these obstacles. The group will process together ways to cope with barriers, with attention given to specific choices patients can make. Each patient will be challenged to identify changes they are motivated to make in order to overcome their obstacles. This group will be process-oriented, with patients participating in exploration of their own experiences as well as giving and receiving support and challenge from other group members.  Therapeutic Goals: 1. Patient will identify personal and current obstacles as they relate to admission. 2. Patient will identify barriers that currently interfere with their wellness or overcoming obstacles.  3. Patient will identify feelings, thought process and behaviors related to these barriers. 4. Patient will identify two changes they are willing to make to overcome these obstacles:   Summary of Patient Progress  Raymond [name] was engaged and participated throughout the group session. He contributed and stated that his current obstacle was "drinking alcohol". Raymond [name] states that he was able to identify alcohol as his current obstacle, based on his current situation. Raymond [name] reports that the barriers keeping him from overcoming his alcohol use issues is "life stuff". Raymond [name] reports he began drinking after the death of his father and after experiencing relationship issues with his wife. Raymond [name] reports that he plans to discharge to a rehabilitation program after he leaves the hospital in order to overcome his "drinking problem". Raymond [name] also shared that he has become interested in therapy services, so that he can continue to  learn better coping skills.    Therapeutic Modalities:  Cognitive Behavioral Therapy Solution Focused Therapy Motivational Interviewing Relapse Prevention Therapy   Theresa Duty Clinical Social Worker

## 2017-05-12 NOTE — Progress Notes (Signed)
Pt did not attend group this evening.  

## 2017-05-13 MED ORDER — TRAZODONE HCL 150 MG PO TABS
150.0000 mg | ORAL_TABLET | Freq: Every day | ORAL | Status: DC
  Administered 2017-05-13: 150 mg via ORAL
  Filled 2017-05-13 (×4): qty 1

## 2017-05-13 NOTE — BHH Group Notes (Signed)
[place] [facility] Group Notes:  (Nursing/MHT/Case Management/Adjunct)  Date:  05/13/2017  Time:  4:00 pm  Type of Therapy:  Group Therapy  Participation Level:  Did Not Attend  Participation Quality:    Affect:    Cognitive:    Insight:    Engagement in Group:    Modes of Intervention:    Summary of Progress/Problems:  Cammy Copa 05/13/2017, 7:50 PM

## 2017-05-13 NOTE — BHH Group Notes (Addendum)
[facility] Group Therapy      05/13/2017 3:02 PM  Type of Therapy: Mental Health Association Presentation  Participation Level: DID NOT ATTEND   Participation Quality:  Affect:  Cognitive:   Insight:   Engagement in Therapy:   Modes of Intervention: Discussion, Education and Socialization  Summary of Progress/Problems:  DID NOT [place] Clinical Social Worker

## 2017-05-13 NOTE — Progress Notes (Signed)
CSW contacted [facility] yesterday, regarding the patient's referral for possible admission. Today, according to admissions, the patient's insurance coverage has a deductible of $10,000, that the patient would have to pay out of pocket. CSW spoke and updated the patient and his wife, Albi Rappaport. Both the patient and his wife agreed to seek alternative options.   CSW contacted [facility] in [place], regarding bed availability for male beds. According to the admissions coordinator, Myrlene Broker, there are no available beds at this time.   CSW contacted [facility], regarding possible bed availability. Per admissions, there are no current beds available, however the patient was placed on their wait list.    CSW will continue to follow.       Radonna Ricker, MSW, [place] Worker [facility]  Phone: [phone]

## 2017-05-13 NOTE — Plan of Care (Signed)
Nurse discussed anxiety, depression, coping skills with patient. 

## 2017-05-13 NOTE — Progress Notes (Signed)
Pt attended orientation/goals group and stated that his plan is to discuss his rehab options here in [place] with the social worker.

## 2017-05-13 NOTE — Progress Notes (Signed)
D:  Patient's self inventory sheet, patient has fair sleep, sleep medication not helpful.  Good appetite, low energy level, good concentration.  Rated depression 3, denied hopeless, anxiety #5.  Denied withdrawals.  Denied SI.  Denied physical problems.  Denied physical pain.  Goal is discharge.  Plans to talk to caseworker. A:  Medications administered per MD orders.  Emotional support and encouragement given patient. R:  Denied SI and HI, contracts for safety.  Denied A/V hallucinations.  Safety maintained with 15 minute checks.

## 2017-05-13 NOTE — Progress Notes (Signed)
[facility] MD Progress Note  05/13/2017 2:17 PM Raymond [name]  MRN:  [id number]   Subjective: Raymond [name] reports, "I'm doing pretty good, but, I did not sleep well last night. I want my sleep medicine switched to something else. I'm also hoping that the social worker will be able to find me a substance abuse treatment place. The plan to go to the [facility] treatment center fell through. I still have the anxiety. I'm also wondering if I could be allowed to go home to be with my girls just for one night prior to going to the treatment program".  Objective: Raymond [name] is a 42 y/o M with history of depression and alcohol use disorder who was admitted on IVC brought in by family with worsening symptoms of depression, alcohol use, and suicide attempt via hanging himself with a belt.  05-13-17, Raymond [name] is seen. Chart reviewed. The chart findings discussed with the treatment team. He presents alert, oriented x 3 & aware of situation. He is visible on the unit, participating in the group counseling sessions. He reports today feeling a lot better than when he first got to the hospital. However, he continues to endorse insomnia. He says the plan to go to the [facility] fell through. He is hoping that the social worker will be able to get him to another treatment center. He also asked if he could be allowed to go home to spend just a night with his children prior to going to the treatment center. He denies any SIHI, AVH, delusional thoughts or paranoia. His Trazodone has been increased to 150 mg po Q hs routinely.  Principal Problem: Alcohol use disorder, severe, dependence ([place])  Diagnosis:   Patient Active Problem List   Diagnosis Date Noted  . Alcohol use disorder, severe, dependence ([place]) [F10.20] 05/09/2017  . Major depressive disorder, recurrent severe without psychotic features ([place]) [F33.2] 05/08/2017  . Alcohol abuse [F10.10]   . Thumb laceration, right, subsequent encounter [S61.011D] 08/23/2016  .  Depression, major, single episode, moderate ([place]) [F32.1] 05/31/2016  . Generalized anxiety disorder [F41.1] 05/31/2016  . Melena [K92.1]   . Rectal polyp [K62.1]   . Problems with swallowing and mastication [R13.10]   . 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 HTN (hypertension) [I10] 04/06/2013  . Allergic rhinitis [J30.9] 01/18/2013  . Family history of long QT syndrome [Z82.49] 06/12/2012  . 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 Smoker [F17.200] 06/12/2012  . Common migraine [G43.009] 05/26/2012  . Fibromyalgia [M79.7] 05/26/2012   Total Time spent with patient: 15 minutes  Past Psychiatric History: Alcoholism, Depression & anxiety  Past Medical History:  Past Medical History:  Diagnosis Date  . Biceps tendonitis on right 01/2014  . History of gastric ulcer    summer 2015  . Hypertension    has not been taking his medication; advised to start back on med. today  . 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 Shoulder impingement 01/2014   right    Past Surgical History:  Procedure Laterality Date  . CHOLECYSTECTOMY    . COLONOSCOPY WITH PROPOFOL N/A 05/14/2016   Procedure: COLONOSCOPY WITH PROPOFOL;  Surgeon: Lucilla Lame, MD;  Location: [facility] ENDOSCOPY;  Service: Endoscopy;  Laterality: N/A;  . ESOPHAGOGASTRODUODENOSCOPY (EGD) WITH PROPOFOL N/A 05/14/2016   Procedure: ESOPHAGOGASTRODUODENOSCOPY (EGD) WITH PROPOFOL;  Surgeon: Lucilla Lame, MD;  Location: [facility] ENDOSCOPY;  Service: Endoscopy;  Laterality: N/A;  . RESECTION DISTAL CLAVICAL Right 02/21/2014   Procedure: RESECTION DISTAL CLAVICAL;  Surgeon: Nita Sells, MD;  Location: [place];  Service: Orthopedics;  Laterality: Right;  . SHOULDER ARTHROSCOPY WITH SUBACROMIAL DECOMPRESSION Right 02/21/2014   Procedure: SHOULDER ARTHROSCOPY WITH SUBACROMIAL DECOMPRESSION, DISTAL CLAVICAL EXCISION, DEBRIDIMENT OF PARTIAL ROTATOR CUFF TEAR;   Surgeon: Nita Sells, MD;  Location: [place];  Service: Orthopedics;  Laterality: Right;  Right shoulder arthroscopy subacromial decompression, distal clavical excision   Family History:  Family History  Problem Relation Age of Onset  . Diabetes Mother   . Heart disease Mother   . Long QT syndrome Mother   . Lung disease Father   . Diabetes Father    Family Psychiatric  History: Denies  Social History:  Social History   Substance and Sexual Activity  Alcohol Use Yes  . Alcohol/week: 0.0 oz   Comment: 1/5 daily     Social History   Substance and Sexual Activity  Drug Use No    Social History   Socioeconomic History  . Marital status: Married    Spouse name: Not on file  . Number of children: Not on file  . Years of education: Not on file  . Highest education level: Not on file  Occupational History  . Occupation: Programmer, systems: Programme researcher, broadcasting/film/video  Social Needs  . Financial resource strain: Not on file  . Food insecurity:    Worry: Not on file    Inability: Not on file  . Transportation needs:    Medical: Not on file    Non-medical: Not on file  Tobacco Use  . Smoking status: Former Smoker    Last attempt to quit: 09/24/2013    Years since quitting: 3.6  . Smokeless tobacco: Never Used  Substance and Sexual Activity  . Alcohol use: Yes    Alcohol/week: 0.0 oz    Comment: 1/5 daily  . Drug use: No  . Sexual activity: Yes    Partners: Female  Lifestyle  . Physical activity:    Days per week: Not on file    Minutes per session: Not on file  . Stress: Not on file  Relationships  . Social connections:    Talks on phone: Not on file    Gets together: Not on file    Attends religious service: Not on file    Active member of club or organization: Not on file    Attends meetings of clubs or organizations: Not on file    Relationship status: Not on file  Other Topics Concern  . Not on file  Social History Narrative   Mom  was in hospital with long QT syndrome, then heart in A Fib.   Sister has long QT syndrome   1st cousin: long QT syndrome         Additional Social History:   Sleep: Fair, improving  Appetite:  Fair  Current Medications: Current Facility-Administered Medications  Medication Dose Route Frequency Provider Last Rate Last Dose  . ARIPiprazole (ABILIFY) tablet 5 mg  5 mg Oral Daily Lindell Spar I, NP   5 mg at 05/13/17 0859  . cholecalciferol (VITAMIN D) tablet 1,000 Units  1,000 Units Oral Daily Lindell Spar I, NP   1,000 Units at 05/13/17 0900  . escitalopram (LEXAPRO) tablet 20 mg  20 mg Oral Daily Ambrose Finland, MD   20 mg at 05/13/17 0859  . etodolac (LODINE) capsule 200 mg  200 mg Oral BID Cobos, Myer Peer, MD   200 mg at 05/13/17 [id number]  And  . etodolac (LODINE) capsule 300 mg  300 mg Oral BID Cobos, Myer Peer, MD   300 mg at 05/13/17 0859  . fluticasone (FLONASE) 50 MCG/ACT nasal spray 2 spray  2 spray Each Nare Daily Lindell Spar I, NP   2 spray at 05/13/17 0900  . hydrochlorothiazide (HYDRODIURIL) tablet 25 mg  25 mg Oral Daily Kelani Robart I, NP   25 mg at 05/13/17 0900  . hydrOXYzine (ATARAX/VISTARIL) tablet 25 mg  25 mg Oral Q4H PRN Lindell Spar I, NP   25 mg at 05/12/17 2111  . hydrOXYzine (ATARAX/VISTARIL) tablet 50 mg  50 mg Oral Q6H PRN Pennelope Bracken, MD   50 mg at 05/11/17 2125  . loratadine (CLARITIN) tablet 10 mg  10 mg Oral Daily Lindell Spar I, NP   10 mg at 05/13/17 0900  . magnesium hydroxide (MILK OF MAGNESIA) suspension 30 mL  30 mL Oral Daily PRN [name], NP      . multivitamin with minerals tablet 1 tablet  1 tablet Oral Daily Cobos, Myer Peer, MD   1 tablet at 05/13/17 0900  . nicotine (NICODERM CQ - dosed in mg/24 hours) patch 21 mg  21 mg Transdermal Daily Ambrose Finland, MD   21 mg at 05/13/17 0900  . potassium chloride SA (K-DUR,KLOR-CON) CR tablet 20 mEq  20 mEq Oral Daily Lindell Spar I, NP   20 mEq at 05/13/17 0900   . traZODone (DESYREL) tablet 150 mg  150 mg Oral QHS Toniya Rozar I, NP       Lab Results: No results found for this or any previous visit (from the past 52 hour(s)).  Blood Alcohol level:  Lab Results  Component Value Date   ETH 99 (H) 58/10/9831   Metabolic Disorder Labs: No results found for: HGBA1C, MPG No results found for: PROLACTIN No results found for: CHOL, TRIG, HDL, CHOLHDL, VLDL, LDLCALC  Physical Findings: AIMS: Facial and Oral Movements Muscles of Facial Expression: None, normal Lips and Perioral Area: None, normal Jaw: None, normal Tongue: None, normal,Extremity Movements Upper (arms, wrists, hands, fingers): None, normal Lower (legs, knees, ankles, toes): None, normal, Trunk Movements Neck, shoulders, hips: None, normal, Overall Severity Severity of abnormal movements (highest score from questions above): None, normal Incapacitation due to abnormal movements: None, normal Patient's awareness of abnormal movements (rate only patient's report): No Awareness, Dental Status Current problems with teeth and/or dentures?: No Does patient usually wear dentures?: No  CIWA:  CIWA-Ar Total: 1 COWS:  COWS Total Score: 2  Musculoskeletal: Strength & Muscle Tone: within normal limits Gait & Station: normal Patient leans: N/A  Psychiatric Specialty Exam: Physical Exam  Nursing note and vitals reviewed.   Review of Systems  Psychiatric/Behavioral: Positive for depression (Some improvement noted) and substance abuse (Hx. alcoholism). Negative for hallucinations, memory loss and suicidal ideas. The patient is nervous/anxious (Ruminations, racing thoughts.) and has insomnia (Improving).     Blood pressure 139/89, pulse 79, temperature 97.7 F (36.5 C), temperature source Oral, resp. rate 18, height 5\' 8"  (1.727 m), weight 73 kg (161 lb), SpO2 99 %.Body mass index is 24.48 kg/m.  General Appearance: Casual  Eye Contact:  Good  Speech:  Clear and Coherent and Normal Rate   Volume:  Normal  Mood:  Anxious   Affect:  Appropriate  Thought Process:  Coherent and Goal Directed  Orientation:  Full (Time, Place, and Person)  Thought Content:  Logical  Suicidal Thoughts:  Denies,   Homicidal Thoughts:  Denies,  Memory:  Immediate;   Good Recent;   Good Remote;   Good  Judgement:  Fair  Insight:  Fair  Psychomotor Activity:  Normal  Concentration:  Concentration: Fair and Attention Span: Fair  Recall:  Good  Fund of Knowledge:  Good  Language:  Good  Akathisia:  No  Handed:  Right  AIMS (if indicated):     Assets:  Communication Skills Desire for Improvement Social Support  ADL's:  Intact  Cognition:  WNL  Sleep:  Number of Hours: 6.75   Treatment Plan Summary: Daily contact with patient to assess and evaluate symptoms and progress in treatment and Medication management : Patient reports some improvement of the racing thoughts however, he continues to worry about other things . He continues to require mood stabilization treatment.  - Continue inpatient psychiatric treatment.  - Will continue today 05/13/2017 plan as below except where it is noted.  Mood control/adjunct tx for depression. - Continue Abilify 5 mg po daily.  -MDD, recurrent severe, without psychosis  Continue Lexapro 20 mg Po daily.  -Alcohol withdrawal  Discontinue CIWA with Ativan, no longer having alcohol withdrawal symptoms.  -Anxiety  Continue Vistaril every 6 hours as needed anxiety/insomnia.  -Insomnia Increased trazodone from 100 mg to 150 mg po at bedtime.   - HTN - Increased  HCTZ from 12.5mg  to 25 mg po q Day due increased rate of blood pressure.            - Continue the potassium 10 mg po daily due to diuretic.  - Chronic pain - Continue etodolac 500mg  BID   -Encourage participation in groups and therapeutic milieu  -Disposition planning will be ongoing  Lindell Spar, NP, pmhnp, fnp-bc 05/13/2017, 2:17 PM  Patient ID: Raymond [name], male   DOB: [birth year], 42 y.o.   MRN: [id number] Patient ID: Raymond [name], male   DOB: [birth year], 42 y.o.   MRN: [id number]

## 2017-05-14 DIAGNOSIS — F1020 Alcohol dependence, uncomplicated: Secondary | ICD-10-CM | POA: Diagnosis not present

## 2017-05-14 MED ORDER — HYDROCHLOROTHIAZIDE 25 MG PO TABS: 25.0000 mg | ORAL_TABLET | Freq: Every day | ORAL | 0 refills | Status: DC

## 2017-05-14 MED ORDER — POTASSIUM CHLORIDE CRYS ER 20 MEQ PO TBCR: 20.0000 meq | EXTENDED_RELEASE_TABLET | Freq: Every day | ORAL | 0 refills | Status: DC

## 2017-05-14 MED ORDER — PSEUDOEPHEDRINE HCL 30 MG PO TABS: 30.0000 mg | ORAL_TABLET | Freq: Four times a day (QID) | ORAL | 0 refills | Status: DC | PRN

## 2017-05-14 MED ORDER — HYDROXYZINE HCL 50 MG PO TABS: 50.0000 mg | ORAL_TABLET | Freq: Four times a day (QID) | ORAL | 0 refills | Status: DC | PRN

## 2017-05-14 MED ORDER — ETODOLAC 200 MG PO CAPS: 200.0000 mg | ORAL_CAPSULE | Freq: Two times a day (BID) | ORAL | 0 refills | Status: DC

## 2017-05-14 MED ORDER — VITAMIN D3 25 MCG (1000 UNIT) PO TABS: 1000.0000 [IU] | ORAL_TABLET | Freq: Every day | ORAL | 0 refills | Status: DC

## 2017-05-14 MED ORDER — NICOTINE 21 MG/24HR TD PT24: 21.0000 mg | MEDICATED_PATCH | Freq: Every day | TRANSDERMAL | 0 refills | Status: DC

## 2017-05-14 MED ORDER — ESCITALOPRAM OXALATE 20 MG PO TABS: 20.0000 mg | ORAL_TABLET | Freq: Every day | ORAL | 0 refills | Status: DC

## 2017-05-14 MED ORDER — LORATADINE 10 MG PO TABS: 10.0000 mg | ORAL_TABLET | Freq: Every day | ORAL | 0 refills | Status: DC

## 2017-05-14 MED ORDER — TRAZODONE HCL 150 MG PO TABS: 150.0000 mg | ORAL_TABLET | Freq: Every day | ORAL | 0 refills | Status: DC

## 2017-05-14 MED ORDER — PSEUDOEPHEDRINE HCL 30 MG PO TABS
30.0000 mg | ORAL_TABLET | Freq: Four times a day (QID) | ORAL | Status: DC | PRN
Start: 2017-05-14 — End: 2017-05-14
  Administered 2017-05-14: 30 mg via ORAL
  Filled 2017-05-14: qty 1
  Filled 2017-05-14: qty 10

## 2017-05-14 MED ORDER — ARIPIPRAZOLE 5 MG PO TABS: 5.0000 mg | ORAL_TABLET | Freq: Every day | ORAL | 0 refills | Status: DC

## 2017-05-14 MED ORDER — ETODOLAC 300 MG PO CAPS: 300.0000 mg | ORAL_CAPSULE | Freq: Two times a day (BID) | ORAL | 0 refills | Status: DC

## 2017-05-14 NOTE — Progress Notes (Signed)
Patient has been accepted to [facility] in [place], [place] for substance abuse treatment.   The patient must be at their facility by 10:30pm tonight. Patient will need a 21-day supply of medications.   Patient reports his wife, Rondrick Barreira will pick him up from the hospital and will also take the patient to [place] for admission.    Agustina Caroli, NP notified.   Dr. Nancy Fetter has been notified.    Radonna Ricker, MSW, [place] Worker [facility]  Phone: [phone]

## 2017-05-14 NOTE — Progress Notes (Signed)
DAR Note: Patient seen on dayroom interacting and socializing well with peers. Patient states "I feel good". Denies pain, SI/HI, AH/VH at this time. Rated depression and anxiety 0/10 respectively. Verbalized no concern. No behavior issues noted. Will continue to monitor patient. Safety maintained at all times.

## 2017-05-14 NOTE — Progress Notes (Signed)
Pt d/c from the hospital. All items returned. D/C instructions given including instructions to f/u with medical doctor about blood pressure and potassium medication. Prescriptions given and samples given. Pt denies si and hi.

## 2017-05-14 NOTE — Progress Notes (Signed)
  [facility] Adult Case Management Discharge Plan :  Will you be returning to the same living situation after discharge:  No. Patient has been accepted to [facility] for treatment. Needs to be at the facility by 10:30pm with a 21-day supply of medications.   At discharge, do you have transportation home?: Yes,  the patient's wife, Thaniel Coluccio Do you have the ability to pay for your medications: Yes,  BCBS  Release of information consent forms completed and in the chart;  Patient's signature needed at discharge.  Patient to Follow up at: [facility]-up [facility].   Specialty:  Addiction Medicine Why:  Patient has been accepted for admission for 10:30pm on 05/14/2017. Please be sure to be on time and have your 21-day supply of medications.  Contact information: [address] [id number] [phone]           Next level of care provider has access to [place] and Suicide Prevention discussed: Yes,  with the patient's wife, Jiovani Mccammon     Has patient been referred to the Quitline?: N/A patient is not a smoker  Patient has been referred for addiction treatment: Yes  Marylee Floras, [place] 05/14/2017, 3:02 PM

## 2017-05-14 NOTE — BHH Suicide Risk Assessment (Signed)
[facility] Discharge Suicide Risk Assessment   Principal Problem: Alcohol use disorder, severe, dependence ([place]) Discharge Diagnoses:  Patient Active Problem List   Diagnosis Date Noted  . Alcohol use disorder, severe, dependence ([place]) [F10.20] 05/09/2017  . Major depressive disorder, recurrent severe without psychotic features ([place]) [F33.2] 05/08/2017  . Alcohol abuse [F10.10]   . Thumb laceration, right, subsequent encounter [S61.011D] 08/23/2016  . Depression, major, single episode, moderate ([place]) [F32.1] 05/31/2016  . Generalized anxiety disorder [F41.1] 05/31/2016  . Melena [K92.1]   . Rectal polyp [K62.1]   . Problems with swallowing and mastication [R13.10]   . 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 HTN (hypertension) [I10] 04/06/2013  . Allergic rhinitis [J30.9] 01/18/2013  . Family history of long QT syndrome [Z82.49] 06/12/2012  . 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 Smoker [F17.200] 06/12/2012  . Common migraine [G43.009] 05/26/2012  . Fibromyalgia [M79.7] 05/26/2012    Total Time spent with patient: 30 minutes  Musculoskeletal: Strength & Muscle Tone: within normal limits Gait & Station: normal Patient leans: N/A  Psychiatric Specialty Exam: Review of Systems  Constitutional: Negative for chills and fever.  Respiratory: Negative for cough and shortness of breath.   Cardiovascular: Negative for chest pain.  Gastrointestinal: Negative for abdominal pain, heartburn, nausea and vomiting.  Psychiatric/Behavioral: Negative for depression, hallucinations and suicidal ideas. The patient is not nervous/anxious and does not have insomnia.     Blood pressure (!) 139/100, pulse 86, temperature (!) 97.5 F (36.4 C), temperature source Oral, resp. rate 18, height 5\' 8"  (1.727 m), weight 73 kg (161 lb), SpO2 99 %.Body mass index is 24.48 kg/m.  General Appearance: Casual and Disheveled  Eye Contact::  Good   Speech:  Clear and Coherent and Normal Rate  Volume:  Normal  Mood:  Euthymic  Affect:  Appropriate, Congruent and Full Range  Thought Process:  Coherent and Goal Directed  Orientation:  Full (Time, Place, and Person)  Thought Content:  Logical  Suicidal Thoughts:  No  Homicidal Thoughts:  No  Memory:  Immediate;   Good Recent;   Good Remote;   Good  Judgement:  Good  Insight:  Good  Psychomotor Activity:  Normal  Concentration:  Good  Recall:  Good  Fund of Knowledge:Good  Language: Good  Akathisia:  No  Handed:    AIMS (if indicated):     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 Social Support  Sleep:  Number of Hours: 6  Cognition: WNL  ADL's:  Intact   Mental Status Per Nursing Assessment::   On Admission:     Demographic Factors:  Male, Caucasian and Low socioeconomic status  Loss Factors: NA  Historical Factors: Family history of mental illness or substance abuse and Impulsivity  Risk Reduction Factors:   Sense of responsibility to family, Living with another person, especially a relative, Positive social support, Positive therapeutic relationship and Positive coping skills or problem solving skills  Continued Clinical Symptoms:  Severe Anxiety and/or Agitation Depression:   Comorbid alcohol abuse/dependence Alcohol/Substance Abuse/Dependencies  Cognitive Features That Contribute To Risk:  None    Suicide Risk:  Minimal: No identifiable suicidal ideation.  Patients presenting with no risk factors but with morbid ruminations; may be classified as minimal risk based on the severity of the depressive symptoms  Follow-up [facility] Follow up.   Specialty:  Addiction Medicine Why:  Patient has been accepted for admission for 10:30pm on 05/14/2017. Please  be sure to be on time and have your 21-day supply of medications.  Contact information: [place]  [address] [phone]         Subjective Data:  Raymond [name] is a 42 y/o M with history of depression and alcohol use disorder who was admitted on IVC brought in by family with worsening symptoms of depression, alcohol use, and suicide attempt via hanging himself with a belt. Pt was continued on lexapro after admission and started on CIWA with ativan. He had improving mood symptoms and no longer was requiring medication to manage symptoms of withdrawal. He worked with SW team to establish follow up plan to attend [facility] for substance use treatment.  Today upon evaluation, pt shares, "I'm doing pretty good." He denies any specific concerns. He denies physical complaints aside from some nasal congestion. He denies SI/HI/AH/VH. He is sleeping well. His appetite is good. He feels that he is tolerating his medications well without difficulty or side effects, and he is in agreement to continue his current regimen without changes. Pt has been working with SW team about establishing treatment for alcohol use after discharge, and pt was accepted to [facility] with intake scheduled for this evening. Pt is in agreement to discharge to outpatient level of care with plan to attend [facility] for alcohol treatment. He was able to engage in safety planning including plan to return to [facility] or contact emergency services if he feels unable to maintain his own safety or the safety of others. Pt had no further questions, comments, or concerns.   Plan Of Care/Follow-up recommendations:   -Discharge to outpatient level of care  -MDD, recurrent severe, without psychosis             - Continue Lexapro 20 mg daily   - Continue abilify 5mg  po qDay   -Anxiety             - Continue Vistaril 50mg  po q6h prn anxiety  -Insomnia   - Continue trazodone 150 mg po at bedtime  - HTN - Continue HCTZ 25mg  po qDay  - Chronic pain - Continue etodolac 500mg  BID   Activity:  as tolerated Diet:  normal Tests:   NA Other:  see above for [place], MD 05/14/2017, 3:01 PM

## 2017-05-14 NOTE — Progress Notes (Signed)
Recreation Therapy Notes  Date: 3.27.19 Time: 9:30 a.m. Location: [address] Dayroom   Group Topic: Stress Management   Goal Area(s) Addresses:  Goal 1.1: To reduce stress  -Patient will report feeling a reduction in stress level  -Patient will identify the importance of stress management  -Patient will participate during stress management group treatment     Behavioral Response: Engaged   Intervention: Stress Management   Activity: Guided Imagery- Patients were in a peaceful environment with soft lighting enhancing patients mood. Patients were read a guided imagery script to help decrease stress levels   Education: Stress Management, Discharge Planning.    Education Outcome: Acknowledges edcuation/In group clarification offered/Needs additional education   Clinical Observations/Feedback:: Patient attended and participated appropriately during stress management group treatment. Patient reported feeling a reduction in stress level.    Ranell Patrick, Recreation Therapy Intern    Ranell Patrick 05/14/2017 8:22 AM

## 2017-05-14 NOTE — Discharge Summary (Addendum)
Physician Discharge Summary Note  Patient:  Raymond [name] is an 42 y.o., male  MRN:  [id number]  DOB:  [birth year]  Patient phone:  [phone] (home)   Patient address:   [place] [address],   Total Time spent with patient: Greater than 30 minutes  Date of Admission:  05/08/2017  Date of Discharge: 05-14-17  Reason for Admission: Worsening symptoms of depression, alcohol use, and suicide attempt via hanging himself with a belt.   Principal Problem: Alcohol use disorder, severe, dependence [facility])  Discharge Diagnoses: Patient Active Problem List   Diagnosis Date Noted  . Alcohol use disorder, severe, dependence ([place]) [F10.20] 05/09/2017  . Major depressive disorder, recurrent severe without psychotic features ([place]) [F33.2] 05/08/2017  . Alcohol abuse [F10.10]   . Thumb laceration, right, subsequent encounter [S61.011D] 08/23/2016  . Depression, major, single episode, moderate ([place]) [F32.1] 05/31/2016  . Generalized anxiety disorder [F41.1] 05/31/2016  . Melena [K92.1]   . Rectal polyp [K62.1]   . Problems with swallowing and mastication [R13.10]   . 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 HTN (hypertension) [I10] 04/06/2013  . Allergic rhinitis [J30.9] 01/18/2013  . Family history of long QT syndrome [Z82.49] 06/12/2012  . 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 Smoker [F17.200] 06/12/2012  . Common migraine [G43.009] 05/26/2012  . Fibromyalgia [M79.7] 05/26/2012   Past Psychiatric History: GAD, Alcohol use disorder, MDD  Past Medical History:  Past Medical History:  Diagnosis Date  . Biceps tendonitis on right 01/2014  . History of gastric ulcer    summer 2015  . Hypertension    has not been taking his medication; advised to start back on med. today  . 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 Shoulder impingement 01/2014   right    Past Surgical History:  Procedure  Laterality Date  . CHOLECYSTECTOMY    . COLONOSCOPY WITH PROPOFOL N/A 05/14/2016   Procedure: COLONOSCOPY WITH PROPOFOL;  Surgeon: Lucilla Lame, MD;  Location: [facility] ENDOSCOPY;  Service: Endoscopy;  Laterality: N/A;  . ESOPHAGOGASTRODUODENOSCOPY (EGD) WITH PROPOFOL N/A 05/14/2016   Procedure: ESOPHAGOGASTRODUODENOSCOPY (EGD) WITH PROPOFOL;  Surgeon: Lucilla Lame, MD;  Location: [facility] ENDOSCOPY;  Service: Endoscopy;  Laterality: N/A;  . RESECTION DISTAL CLAVICAL Right 02/21/2014   Procedure: RESECTION DISTAL CLAVICAL;  Surgeon: Nita Sells, MD;  Location: [facility];  Service: Orthopedics;  Laterality: Right;  . SHOULDER ARTHROSCOPY WITH SUBACROMIAL DECOMPRESSION Right 02/21/2014   Procedure: SHOULDER ARTHROSCOPY WITH SUBACROMIAL DECOMPRESSION, DISTAL CLAVICAL EXCISION, DEBRIDIMENT OF PARTIAL ROTATOR CUFF TEAR;  Surgeon: Nita Sells, MD;  Location: [facility];  Service: Orthopedics;  Laterality: Right;  Right shoulder arthroscopy subacromial decompression, distal clavical excision   Family History:  Family History  Problem Relation Age of Onset  . Diabetes Mother   . Heart disease Mother   . Long QT syndrome Mother   . Lung disease Father   . Diabetes Father    Family Psychiatric  History: See H&P Social History:  Social History   Substance and Sexual Activity  Alcohol Use Yes  . Alcohol/week: 0.0 oz   Comment: 1/5 daily     Social History   Substance and Sexual Activity  Drug Use No    Social History   Socioeconomic History  . Marital status: Married    Spouse name: Not on file  . Number of children: Not on file  . Years of education: Not on file  . Highest  education level: Not on file  Occupational History  . Occupation: Programmer, systems: Programme researcher, broadcasting/film/video  Social Needs  . Financial resource strain: Not on file  . Food insecurity:    Worry: Not on file    Inability: Not on file  . Transportation needs:    Medical: Not  on file    Non-medical: Not on file  Tobacco Use  . Smoking status: Former Smoker    Last attempt to quit: 09/24/2013    Years since quitting: 3.6  . Smokeless tobacco: Never Used  Substance and Sexual Activity  . Alcohol use: Yes    Alcohol/week: 0.0 oz    Comment: 1/5 daily  . Drug use: No  . Sexual activity: Yes    Partners: Female  Lifestyle  . Physical activity:    Days per week: Not on file    Minutes per session: Not on file  . Stress: Not on file  Relationships  . Social connections:    Talks on phone: Not on file    Gets together: Not on file    Attends religious service: Not on file    Active member of club or organization: Not on file    Attends meetings of clubs or organizations: Not on file    Relationship status: Not on file  Other Topics Concern  . Not on file  Social History Narrative   Mom was in hospital with long QT syndrome, then heart in A Fib.   Sister has long QT syndrome   1st cousin: long QT syndrome         Hospital Course: (Per Md's SRA): Raymond [name] is a 42 y/o M with history of depression and alcohol use disorder who was admitted on IVC brought in by family with worsening symptoms of depression, alcohol use, and suicide attempt via hanging himself with a belt. Pt was continued on lexapro after admission and started on CIWA with ativan. He had improving mood symptoms and no longer was requiring medication to manage symptoms of withdrawal. He worked with SW team to establish follow up plan to attend [facility] for substance use treatment.  Besides the Ativan detox protocols for alcohol withdrawal symptoms & Lexapro 20 mg for depression, Raymond [name] was also medicated & discharged on; Hydroxyzine 50 mg prn for anxiety, Abilify 5 mg for mood control, Nicotine patch 21 mg for smoking cessation & Trazodone 150 mg for insomnia. He presented other significant health issues that required treatment & or monitoring. He tolerated his treatment regimen without any adverse  effects or reactions reported. Raymond [name] was enrolled & participated in the group counseling sessions being offered & held on this unit. He learned coping skills.  Today upon his discharge evaluation, pt shares, "I'm doing pretty good." He denies any specific concerns. He denies physical complaints aside from some nasal congestion. He denies SI/HI/AH/VH. He is sleeping well. His appetite is good. He feels that he is tolerating his medications well without difficulty or side effects, and he is in agreement to continue his current regimen without changes. Pt has been working with SW team about establishing treatment for alcohol use after discharge, and pt was accepted to [facility] with intake scheduled for this evening. Pt is in agreement to discharge to outpatient level of care with plan to attend [facility] for alcohol treatment. He was able to engage in safety planning including plan to return to [facility] or contact emergency services if he feels unable to maintain his own safety or the  safety of others. Pt had no further questions, comments, or concerns.  Raymond [name] is currently being discharged to continue mental health care on an outpatient basis as noted below. And for substance abuse treatment, he has been accepted to continue this treatment at [facility]. Raymond [name] is given a 21 days worth supply samples of his [facility] discharge medications. He left [facility] with all personal belongings in no apparent distress. Transportation per wife.  Physical Findings: AIMS: Facial and Oral Movements Muscles of Facial Expression: None, normal Lips and Perioral Area: None, normal Jaw: None, normal Tongue: None, normal,Extremity Movements Upper (arms, wrists, hands, fingers): None, normal Lower (legs, knees, ankles, toes): None, normal, Trunk Movements Neck, shoulders, hips: None, normal, Overall Severity Severity of abnormal movements (highest score from questions above): None, normal Incapacitation due to abnormal movements: None,  normal Patient's awareness of abnormal movements (rate only patient's report): No Awareness, Dental Status Current problems with teeth and/or dentures?: No Does patient usually wear dentures?: No  CIWA:  CIWA-Ar Total: 1 COWS:  COWS Total Score: 1  Musculoskeletal: Strength & Muscle Tone: within normal limits Gait & Station: normal Patient leans: N/A  Psychiatric Specialty Exam: Physical Exam  Constitutional: He appears well-developed.  HENT:  Head: Normocephalic.  Eyes: Pupils are equal, round, and reactive to light.  Neck: Normal range of motion.  Cardiovascular: Normal rate.  Respiratory: Effort normal.  GI: Soft.  Genitourinary:  Genitourinary Comments: Deferred  Musculoskeletal: Normal range of motion.  Neurological: He is alert.  Skin: Skin is warm.    Review of Systems  Constitutional: Negative.   HENT: Negative.   Eyes: Negative.   Respiratory: Negative.   Cardiovascular: Negative.   Gastrointestinal: Negative.   Genitourinary: Negative.   Musculoskeletal: Negative.   Skin: Negative.   Neurological: Negative.   Endo/Heme/Allergies: Negative.   Psychiatric/Behavioral: Positive for depression (Stable) and substance abuse (Hx. Alcoholism). Negative for hallucinations, memory loss and suicidal ideas. The patient has insomnia (Stable). The patient is not nervous/anxious.     Blood pressure (!) 139/100, pulse 86, temperature (!) 97.5 F (36.4 C), temperature source Oral, resp. rate 18, height 5\' 8"  (1.727 m), weight 73 kg (161 lb), SpO2 99 %.Body mass index is 24.48 kg/m.  See H&P   Has this patient used any form of tobacco in the last 30 days? (Cigarettes, Smokeless Tobacco, Cigars, and/or Pipes): Yes, an FDA-approved tobacco cessation medication was offered at discharge.  Blood Alcohol level:  Lab Results  Component Value Date   ETH 99 (H) 25/85/2778   Metabolic Disorder Labs:  No results found for: HGBA1C, MPG No results found for: PROLACTIN No results  found for: CHOL, TRIG, HDL, CHOLHDL, VLDL, LDLCALC  See Psychiatric Specialty Exam and Suicide Risk Assessment completed by Attending Physician prior to discharge.  Discharge destination:  [facility]  Is patient on multiple antipsychotic therapies at discharge:  No   Has Patient had three or more failed trials of antipsychotic monotherapy by history:  No  Recommended Plan for Multiple Antipsychotic Therapies: NA  Allergies as of 05/14/2017      Reactions   Azithromycin Nausea And Vomiting   Percocet [oxycodone-acetaminophen] Hives   Vicodin [hydrocodone-acetaminophen] Hives   Adhesive [tape] Other (See Comments)   SKIN IRRITATION      Medication List    STOP taking these medications   amphetamine-dextroamphetamine 15 MG tablet Commonly known as:  ADDERALL   cetirizine-pseudoephedrine 5-120 MG tablet Commonly known as:  ZYRTEC-D Replaced by:  loratadine 10 MG tablet   cyclobenzaprine 10  MG tablet Commonly known as:  FLEXERIL   etodolac 500 MG tablet Commonly known as:  LODINE Replaced by:  etodolac 300 MG capsule   fluticasone 50 MCG/ACT nasal spray Commonly known as:  FLONASE   hydrochlorothiazide 12.5 MG capsule Commonly known as:  MICROZIDE Replaced by:  hydrochlorothiazide 25 MG tablet   lidocaine 5 % Commonly known as:  LIDODERM   sildenafil 100 MG tablet Commonly known as:  VIAGRA   zolpidem 12.5 MG CR tablet Commonly known as:  AMBIEN CR     TAKE these medications     Indication  ARIPiprazole 5 MG tablet Commonly known as:  ABILIFY Take 1 tablet (5 mg total) by mouth daily. For mood control Start taking on:  05/15/2017  Indication:  Mood control & adjunct to the Lexapro.   cholecalciferol 1000 units tablet Commonly known as:  VITAMIN D Take 1 tablet (1,000 Units total) by mouth daily. For bone health Start taking on:  05/15/2017 What changed:    medication strength  how much to take  additional instructions  Indication:  Bone health    escitalopram 20 MG tablet Commonly known as:  LEXAPRO Take 1 tablet (20 mg total) by mouth daily. For depression Start taking on:  05/15/2017 What changed:    medication strength  how much to take  additional instructions  Indication:  Major Depressive Disorder   etodolac 300 MG capsule Commonly known as:  LODINE Take 1 capsule (300 mg total) by mouth 2 (two) times daily. Replaces:  etodolac 500 MG tablet  Indication:  Pain   etodolac 200 MG capsule Commonly known as:  LODINE Take 1 capsule (200 mg total) by mouth 2 (two) times daily. For pain  Indication:  Pain   hydrochlorothiazide 25 MG tablet Commonly known as:  HYDRODIURIL Take 1 tablet (25 mg total) by mouth daily. For high blood pressure Start taking on:  05/15/2017 Replaces:  hydrochlorothiazide 12.5 MG capsule  Indication:  High Blood Pressure Disorder   hydrOXYzine 50 MG tablet Commonly known as:  ATARAX/VISTARIL Take 1 tablet (50 mg total) by mouth every 6 (six) hours as needed for anxiety.  Indication:  Feeling Anxious   loratadine 10 MG tablet Commonly known as:  CLARITIN Take 1 tablet (10 mg total) by mouth daily. For allergies Start taking on:  05/15/2017 Replaces:  cetirizine-pseudoephedrine 5-120 MG tablet  Indication:  Perennial Allergic Rhinitis, Hayfever   nicotine 21 mg/24hr patch Commonly known as:  NICODERM CQ - dosed in mg/24 hours Place 1 patch (21 mg total) onto the skin daily. (may purchase from over the counter): For smoking cessation Start taking on:  05/15/2017  Indication:  Nicotine Addiction   potassium chloride SA 20 MEQ tablet Commonly known as:  K-DUR,KLOR-CON Take 1 tablet (20 mEq total) by mouth daily. For low potassium Start taking on:  05/15/2017  Indication:  High Blood Pressure Disorder, Low Amount of Potassium in the Blood   pseudoephedrine 30 MG tablet Commonly known as:  SUDAFED Take 1 tablet (30 mg total) by mouth every 6 (six) hours as needed for congestion.   Indication:  Cold Symptoms, Stuffy Nose   traZODone 150 MG tablet Commonly known as:  DESYREL Take 1 tablet (150 mg total) by mouth at bedtime. For sleep  Indication:  [place] Follow up.   Specialty:  Addiction Medicine Why:  Patient has been accepted for admission for 10:30pm on 05/14/2017.  Please be sure to be on time and have your 21-day supply of medications.  Contact information: [place] [address] [phone]          Follow-up recommendations:  Activity:  As tolerated Diet: As recommended by your primary care doctor. Keep all scheduled follow-up appointments as recommended.  Comments: Patient is instructed prior to discharge to: Take all medications as prescribed by his/her mental healthcare provider. Report any adverse effects and or reactions from the medicines to his/her outpatient provider promptly. Patient has been instructed & cautioned: To not engage in alcohol and or illegal drug use while on prescription medicines. In the event of worsening symptoms, patient is instructed to call the crisis hotline, 911 and or go to the nearest ED for appropriate evaluation and treatment of symptoms. To follow-up with his/her primary care provider for your other medical issues, concerns and or health care needs.   Signed: Lindell Spar, NP, PMHNP, FNP-BC 05/14/2017, 3:14 PM   Patient seen, Suicide Assessment Completed.  Disposition Plan Reviewed

## 2017-05-14 NOTE — Progress Notes (Signed)
Pt attended morning orientation/goals group and stated that his goal is to get to a rehab treatment center so he can be the best father possible.

## 2017-05-14 NOTE — BHH Group Notes (Signed)
LCSW Group Therapy Note 05/14/2017 2:13 PM  Type of Therapy/Topic: Group Therapy: Feelings about Diagnosis  Participation Level: Active   Description of Group:  This group will allow patients to explore their thoughts and feelings about diagnoses they have received. Patients will be guided to explore their level of understanding and acceptance of these diagnoses. Facilitator will encourage patients to process their thoughts and feelings about the reactions of others to their diagnosis and will guide patients in identifying ways to discuss their diagnosis with significant others in their lives. This group will be process-oriented, with patients participating in exploration of their own experiences, giving and receiving support, and processing challenge from other group members.  Therapeutic Goals: 1. Patient will demonstrate understanding of diagnosis as evidenced by identifying two or more symptoms of the disorder 2. Patient will be able to express two feelings regarding the diagnosis 3. Patient will demonstrate their ability to communicate their needs through discussion and/or role play  Summary of Patient Progress:  Raymond [name] was engaged and participated throughout the group session. Raymond [name] reports that he "felt really bad" when he learned that he had a mental health diagnosis. Raymond [name] reports that his diagnosis, "totally messed everything up" with his family. Raymond [name] states that he plans to go to a treatment facility after discharging from the hospital, in hopes to learn better coping skills that he can utilize towards his relationships and road to recovery. Raymond [name] was excused from the last 20 minutes of group by a [facility] peer support specialist.    Therapeutic Modalities:  Cognitive Behavioral Therapy Brief Therapy Feelings Identification    [place] Clinical Social Worker

## 2017-06-03 ENCOUNTER — Other Ambulatory Visit: Payer: Self-pay | Admitting: Family Medicine

## 2017-06-03 NOTE — Telephone Encounter (Signed)
I decline to fill and agree with behavioral health and have read their notes.

## 2017-06-03 NOTE — Telephone Encounter (Signed)
Last office visit 08/23/2016 with Dr. Diona Browner for thumb laceration.  Look like mediation was stopped by behavior health and put on trazodone.   Refill?

## 2017-06-05 ENCOUNTER — Telehealth: Payer: Self-pay | Admitting: *Deleted

## 2017-06-05 ENCOUNTER — Ambulatory Visit (INDEPENDENT_AMBULATORY_CARE_PROVIDER_SITE_OTHER): Payer: BLUE CROSS/BLUE SHIELD | Admitting: Family Medicine

## 2017-06-05 ENCOUNTER — Encounter: Payer: Self-pay | Admitting: *Deleted

## 2017-06-05 ENCOUNTER — Encounter: Payer: Self-pay | Admitting: Family Medicine

## 2017-06-05 VITALS — BP 148/96 | HR 98 | Temp 98.8°F | Ht 68.0 in | Wt 170.5 lb

## 2017-06-05 DIAGNOSIS — L989 Disorder of the skin and subcutaneous tissue, unspecified: Secondary | ICD-10-CM | POA: Diagnosis not present

## 2017-06-05 MED ORDER — MUPIROCIN 2 % EX OINT: 1.0000 "application " | TOPICAL_OINTMENT | Freq: Two times a day (BID) | CUTANEOUS | 0 refills | Status: DC

## 2017-06-05 NOTE — Telephone Encounter (Signed)
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

## 2017-06-05 NOTE — Telephone Encounter (Signed)
He can certainly work while taking abilify.   Initially they asked about 2 medications, so I still need to know about the 2nd medication. My guess is this may be hydroxyzine? If so, then yes he can work while taking it, otherwise I need to know the name of the 2nd medication.

## 2017-06-05 NOTE — Telephone Encounter (Signed)
Tried to call Raymond [name].  Mailbox is full so I was unable to leave message.  If she calls back.  We need to know the names of the new medications Raymond [name] is taking.  Can't write letter without that information.  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

## 2017-06-05 NOTE — Patient Instructions (Signed)
Keep the skin lesion clean and dry with soap and water  Cover if needed  Use the bactroban ointment twice daily   We will refer you to dermatology   If you develop swelling/ increased redness/ drainage from or pain in the area please call and alert [place]   The swollen lymph node should improve as the skin lesion calms down

## 2017-06-05 NOTE — Telephone Encounter (Signed)
Letter written as instructed by Dr. Lorelei Pont.   [name] notified by telephone that letter is ready to be picked up at the front desk.

## 2017-06-05 NOTE — Telephone Encounter (Signed)
Copied from [place] [id number] [id number]. Topic: Inquiry >> Jun 05, 2017  8:12 AM Conception Chancy, NT wrote: Reason for CRM: wife would like a call back from [facility]. States that the patient was put on 2 new medications ( does not know the name of them ) and his employer needs a note stating it is okay for him to work while taking them. Please advise.

## 2017-06-05 NOTE — Telephone Encounter (Signed)
These medications should pose no problems.   If possible, can you stamp a letter saying:   Raymond [name] has recently started taking several new medications, but none of these should be impairing.  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

## 2017-06-05 NOTE — Telephone Encounter (Signed)
Copied from [place] [id number]. Topic: Quick Communication - Office Called Patient >> Jun 05, 2017  9:01 AM Carter Kitten, CMA wrote: Reason for CRM:  If Kenney Houseman calls back please let her know we need to know the names of the new medications Lotus is taking.  Can't write letter without that information.  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  Ok to [facility] to get this information from wife if she calls back. >> Jun 05, 2017  9:54 AM Antonieta Iba C wrote: Pt's spouse Lauro Regulus called back in to provide the pt's medication for letter. 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

## 2017-06-05 NOTE — Progress Notes (Signed)
Subjective:    Patient ID: Raymond [name], male    DOB: [birth year], 42 y.o.   MRN: [id number]  HPI Here for c/o swollen glands and spot on neck   42 yo pt of Dr Lorelei Pont  Wt Readings from Last 3 Encounters:  06/05/17 170 lb 8 oz (77.3 kg)  08/23/16 169 lb 12 oz (77 kg)  08/13/16 170 lb (77.1 kg)   25.92 kg/m   Feels a knot in neck - (behind ear)  3 days  A little tender  No other areas   Also a warty spot? - back of the neck for 2 months = itchy and irritated   No cold or fever Some allergy symptoms  Ears feel fine   Patient Active Problem List   Diagnosis Date Noted  . Skin lesion of neck 06/05/2017  . Alcohol use disorder, severe, dependence ([place]) 05/09/2017  . Major depressive disorder, recurrent severe without psychotic features ([place]) 05/08/2017  . Alcohol abuse   . Thumb laceration, right, subsequent encounter 08/23/2016  . Depression, major, single episode, moderate ([place]) 05/31/2016  . Generalized anxiety disorder 05/31/2016  . Melena   . Rectal polyp   . Problems with swallowing and mastication   . 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 HTN (hypertension) 04/06/2013  . Allergic rhinitis 01/18/2013  . Family history of long QT syndrome 06/12/2012  . 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 Smoker 06/12/2012  . Common migraine 05/26/2012  . Fibromyalgia 05/26/2012   Past Medical History:  Diagnosis Date  . Biceps tendonitis on right 01/2014  . History of gastric ulcer    summer 2015  . Hypertension    has not been taking his medication; advised to start back on med. today  . 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 Shoulder impingement 01/2014   right   Past Surgical History:  Procedure Laterality Date  . CHOLECYSTECTOMY    . COLONOSCOPY WITH PROPOFOL N/A 05/14/2016   Procedure: COLONOSCOPY WITH PROPOFOL;  Surgeon: Lucilla Lame, MD;  Location: [facility]COPY;  Service: Endoscopy;  Laterality:  N/A;  . ESOPHAGOGASTRODUODENOSCOPY (EGD) WITH PROPOFOL N/A 05/14/2016   Procedure: ESOPHAGOGASTRODUODENOSCOPY (EGD) WITH PROPOFOL;  Surgeon: Lucilla Lame, MD;  Location: [facility]COPY;  Service: Endoscopy;  Laterality: N/A;  . RESECTION DISTAL CLAVICAL Right 02/21/2014   Procedure: RESECTION DISTAL CLAVICAL;  Surgeon: Nita Sells, MD;  Location: [place];  Service: Orthopedics;  Laterality: Right;  . SHOULDER ARTHROSCOPY WITH SUBACROMIAL DECOMPRESSION Right 02/21/2014   Procedure: SHOULDER ARTHROSCOPY WITH SUBACROMIAL DECOMPRESSION, DISTAL CLAVICAL EXCISION, DEBRIDIMENT OF PARTIAL ROTATOR CUFF TEAR;  Surgeon: Nita Sells, MD;  Location: [place];  Service: Orthopedics;  Laterality: Right;  Right shoulder arthroscopy subacromial decompression, distal clavical excision   Social History   Tobacco Use  . Smoking status: Former Smoker    Last attempt to quit: 09/24/2013    Years since quitting: 3.7  . Smokeless tobacco: Never Used  Substance Use Topics  . Alcohol use: Yes    Alcohol/week: 0.0 oz    Comment: 1/5 daily  . Drug use: No   Family History  Problem Relation Age of Onset  . Diabetes Mother   . Heart disease Mother   . Long QT syndrome Mother   . Lung disease Father   . Diabetes Father    Allergies  Allergen Reactions  . Azithromycin Nausea And Vomiting  . Percocet [Oxycodone-Acetaminophen] Hives  . Vicodin [Hydrocodone-Acetaminophen] Hives  .  Adhesive [Tape] Other (See Comments)    SKIN IRRITATION   Current Outpatient Medications on File Prior to Visit  Medication Sig Dispense Refill  . ARIPiprazole (ABILIFY) 5 MG tablet Take 1 tablet (5 mg total) by mouth daily. For mood control 30 tablet 0  . cholecalciferol (VITAMIN D) 1000 units tablet Take 1 tablet (1,000 Units total) by mouth daily. For bone health 30 tablet 0  . escitalopram (LEXAPRO) 20 MG tablet Take 1 tablet (20 mg total) by mouth daily. For depression 30 tablet  0  . etodolac (LODINE) 200 MG capsule Take 1 capsule (200 mg total) by mouth 2 (two) times daily. For pain 30 capsule 0  . etodolac (LODINE) 300 MG capsule Take 1 capsule (300 mg total) by mouth 2 (two) times daily. 30 capsule 0  . hydrochlorothiazide (HYDRODIURIL) 25 MG tablet Take 1 tablet (25 mg total) by mouth daily. For high blood pressure 30 tablet 0  . hydrOXYzine (ATARAX/VISTARIL) 50 MG tablet Take 1 tablet (50 mg total) by mouth every 6 (six) hours as needed for anxiety. 75 tablet 0  . loratadine (CLARITIN) 10 MG tablet Take 1 tablet (10 mg total) by mouth daily. For allergies 30 tablet 0  . nicotine (NICODERM CQ - DOSED IN MG/24 HOURS) 21 mg/24hr patch Place 1 patch (21 mg total) onto the skin daily. (may purchase from over the counter): For smoking cessation 28 patch 0  . potassium chloride SA (K-DUR,KLOR-CON) 20 MEQ tablet Take 1 tablet (20 mEq total) by mouth daily. For low potassium 30 tablet 0  . pseudoephedrine (SUDAFED) 30 MG tablet Take 1 tablet (30 mg total) by mouth every 6 (six) hours as needed for congestion. 1 tablet 0  . traZODone (DESYREL) 150 MG tablet Take 1 tablet (150 mg total) by mouth at bedtime. For sleep 30 tablet 0   No current facility-administered medications on file prior to visit.     Review of Systems  Constitutional: Negative for activity change, appetite change, fatigue, fever and unexpected weight change.  HENT: Negative for congestion, rhinorrhea, sore throat and trouble swallowing.   Eyes: Negative for pain, redness, itching and visual disturbance.  Respiratory: Negative for cough, chest tightness, shortness of breath and wheezing.   Cardiovascular: Negative for chest pain and palpitations.  Gastrointestinal: Negative for abdominal pain, blood in stool, constipation, diarrhea and nausea.  Endocrine: Negative for cold intolerance, heat intolerance, polydipsia and polyuria.  Genitourinary: Negative for difficulty urinating, dysuria, frequency and  urgency.  Musculoskeletal: Negative for arthralgias, joint swelling and myalgias.  Skin: Negative for pallor and rash.       Growth on neck  Neurological: Negative for dizziness, tremors, weakness, numbness and headaches.  Hematological: Negative for adenopathy. Does not bruise/bleed easily.  Psychiatric/Behavioral: Negative for decreased concentration and dysphoric mood. The patient is not nervous/anxious.        Objective:   Physical Exam  Constitutional: He appears well-developed and well-nourished. No distress.  Well appearing   HENT:  Head: Normocephalic and atraumatic.  Right Ear: External ear normal.  Left Ear: External ear normal.  Nose: Nose normal.  Mouth/Throat: Oropharynx is clear and moist. No oropharyngeal exudate.  Eyes: Pupils are equal, round, and reactive to light. Conjunctivae and EOM are normal. Right eye exhibits no discharge. Left eye exhibits no discharge.  Neck: Normal range of motion. Neck supple. No thyromegaly present.  L posterior cervical LN -mobile and mildly tender approx 1 cm   Cardiovascular: Normal rate, regular rhythm and normal heart sounds.  Pulmonary/Chest: Effort normal and breath sounds normal. No respiratory distress. He has no wheezes. He has no rales.  Musculoskeletal: He exhibits no edema.  Lymphadenopathy:       Head (right side): No submental, no submandibular, no tonsillar, no preauricular, no posterior auricular and no occipital adenopathy present.       Head (left side): No submental, no submandibular, no tonsillar, no preauricular, no posterior auricular and no occipital adenopathy present.    He has cervical adenopathy.       Left cervical: Posterior cervical adenopathy present.    He has no axillary adenopathy.       Right axillary: No pectoral adenopathy present.       Left axillary: No pectoral and no lateral adenopathy present. Skin: Skin is warm and dry. No rash noted. No erythema. No pallor.  0.5 mm to 1 cm erythematous  keratotic skin lesion on R posterior neck above area of inflamed LN  Psychiatric: He has a normal mood and affect.          Assessment & Plan:   Problem List Items Addressed This Visit      Musculoskeletal and Integument   Skin lesion of neck - Primary    Keratotic/inflammed lesion on L posterior neck causing lymphadenopathy  Given px for bactroban to use bid Soap and water cleanse  Ref to derm for eval and tx Update if no imp in adenopathy with resolution of skin lesion      Relevant Orders   Ambulatory referral to Dermatology

## 2017-06-05 NOTE — Telephone Encounter (Signed)
I need to confirm exactly what she is talking about. He has been in the hospital, and I am pretty sure he went into a rehab facility.  He is still doing HVAC work?

## 2017-06-06 NOTE — Assessment & Plan Note (Signed)
Keratotic/inflammed lesion on L posterior neck causing lymphadenopathy  Given px for bactroban to use bid Soap and water cleanse  Ref to derm for eval and tx Update if no imp in adenopathy with resolution of skin lesion

## 2017-06-11 ENCOUNTER — Encounter: Payer: Self-pay | Admitting: Family Medicine

## 2017-06-25 ENCOUNTER — Other Ambulatory Visit: Payer: Self-pay | Admitting: Family Medicine

## 2017-07-02 ENCOUNTER — Telehealth: Payer: Self-pay | Admitting: *Deleted

## 2017-07-02 MED ORDER — HYDROCHLOROTHIAZIDE 12.5 MG PO CAPS: 12.5000 mg | ORAL_CAPSULE | Freq: Every day | ORAL | 0 refills | Status: DC

## 2017-07-02 NOTE — Telephone Encounter (Signed)
Correction: they lowered to 12.5 mg

## 2017-07-02 NOTE — Telephone Encounter (Signed)
Received fax from [facility] requesting refill for HCTZ 12.5 mg capsules to take one capsule by mouth daily.  We have HCTZ 25 mg on current medication list.  Patient has not been seen by PCP in over a year.  Please advise what to refill?

## 2017-07-02 NOTE — Telephone Encounter (Signed)
Inpatient team increased to 25 mg. I am ok refill, but I would like him to f/u with me this summer for a  Complete physical.

## 2017-07-03 NOTE — Telephone Encounter (Signed)
Raymond [name],  Please schedule Raymond [name] a CPE with fasting labs prior with Dr. Lorelei Pont sometime this Summer.

## 2017-07-03 NOTE — Telephone Encounter (Signed)
Spoke with spouse she will have pt to call to schedule appointment

## 2017-07-07 ENCOUNTER — Encounter: Payer: Self-pay | Admitting: Family Medicine

## 2017-07-07 NOTE — Telephone Encounter (Signed)
Mailed letter °

## 2017-10-01 ENCOUNTER — Encounter: Payer: Self-pay | Admitting: Family Medicine

## 2017-10-01 ENCOUNTER — Ambulatory Visit (INDEPENDENT_AMBULATORY_CARE_PROVIDER_SITE_OTHER): Payer: BLUE CROSS/BLUE SHIELD | Admitting: Family Medicine

## 2017-10-01 VITALS — BP 100/72 | HR 88 | Temp 98.3°F | Ht 68.0 in | Wt 175.0 lb

## 2017-10-01 DIAGNOSIS — F411 Generalized anxiety disorder: Secondary | ICD-10-CM | POA: Diagnosis not present

## 2017-10-01 DIAGNOSIS — M797 Fibromyalgia: Secondary | ICD-10-CM

## 2017-10-01 DIAGNOSIS — B880 Other acariasis: Secondary | ICD-10-CM

## 2017-10-01 DIAGNOSIS — F900 Attention-deficit hyperactivity disorder, predominantly inattentive type: Secondary | ICD-10-CM | POA: Diagnosis not present

## 2017-10-01 DIAGNOSIS — F332 Major depressive disorder, recurrent severe without psychotic features: Secondary | ICD-10-CM

## 2017-10-01 DIAGNOSIS — F1020 Alcohol dependence, uncomplicated: Secondary | ICD-10-CM | POA: Diagnosis not present

## 2017-10-01 MED ORDER — TRIAMCINOLONE ACETONIDE 0.1 % EX CREA: 1.0000 "application " | TOPICAL_CREAM | Freq: Two times a day (BID) | CUTANEOUS | 1 refills | Status: DC

## 2017-10-01 MED ORDER — ETODOLAC 300 MG PO CAPS: 300.0000 mg | ORAL_CAPSULE | Freq: Two times a day (BID) | ORAL | 1 refills | Status: DC

## 2017-10-01 MED ORDER — HYDROCHLOROTHIAZIDE 25 MG PO TABS: 25.0000 mg | ORAL_TABLET | Freq: Every day | ORAL | 1 refills | Status: DC

## 2017-10-01 MED ORDER — PREDNISONE 20 MG PO TABS: ORAL_TABLET | ORAL | 0 refills | Status: DC

## 2017-10-01 MED ORDER — ARIPIPRAZOLE 5 MG PO TABS: 5.0000 mg | ORAL_TABLET | Freq: Every day | ORAL | 1 refills | Status: DC

## 2017-10-01 MED ORDER — HYDROXYZINE HCL 50 MG PO TABS: 50.0000 mg | ORAL_TABLET | Freq: Four times a day (QID) | ORAL | 1 refills | Status: DC | PRN

## 2017-10-01 MED ORDER — VITAMIN D3 25 MCG (1000 UNIT) PO TABS: 1000.0000 [IU] | ORAL_TABLET | Freq: Every day | ORAL | 1 refills | Status: DC

## 2017-10-01 MED ORDER — ETODOLAC 200 MG PO CAPS: 200.0000 mg | ORAL_CAPSULE | Freq: Two times a day (BID) | ORAL | 0 refills | Status: DC

## 2017-10-01 MED ORDER — TRAZODONE HCL 150 MG PO TABS: 150.0000 mg | ORAL_TABLET | Freq: Every day | ORAL | 1 refills | Status: DC

## 2017-10-01 MED ORDER — ESCITALOPRAM OXALATE 20 MG PO TABS: 20.0000 mg | ORAL_TABLET | Freq: Every day | ORAL | 1 refills | Status: DC

## 2017-10-01 MED ORDER — AZELASTINE HCL 0.1 % NA SOLN: NASAL | 1 refills | Status: DC

## 2017-10-01 MED ORDER — AMPHETAMINE-DEXTROAMPHETAMINE 15 MG PO TABS: 15.0000 mg | ORAL_TABLET | Freq: Two times a day (BID) | ORAL | 0 refills | Status: DC

## 2017-10-01 MED ORDER — POTASSIUM CHLORIDE CRYS ER 20 MEQ PO TBCR: 20.0000 meq | EXTENDED_RELEASE_TABLET | Freq: Every day | ORAL | 1 refills | Status: DC

## 2017-10-01 NOTE — Progress Notes (Signed)
Dr. Frederico Hamman T. Daijah Scrivens, MD, [facility] [place], [address] Phone: [phone] Fax: [phone]  10/01/2017  Patient: Raymond [name], MRN: [id number], DOB: [birth year], 42 y.o.  Primary Physician:  [name], MD   Chief Complaint  Patient presents with  . Insect Bite   Subjective:   Raymond [name] is a 42 y.o. very pleasant male patient who presents with the following:  Chigger bites - hydrocortisone cream, itching really bad.  He knows that he was directly exposed to chiggers on an extensive scale earlier in the week, now he has diffuse bites throughout his lower extremities, and into his groin region and into his arms.  He also has not seen me in follow-up for his hospitalization at the psychiatric hospital, alcohol abuse, severe, depression, anxiety, hyper myalgia, and he also attempted suicide via hanging.  He ultimately did not keep his psychiatric follow-up care after this.  He tells me he is doing a lot better, is not drinking at all, and he feels dramatically better from a mood standpoint, anxiety standpoint, as well as his anxiety and fibromyalgia since he has been on his recent new medications.  Past Medical History, Surgical History, Social History, Family History, Problem List, Medications, and Allergies have been reviewed and updated if relevant.  Patient Active Problem List   Diagnosis Date Noted  . Skin lesion of neck 06/05/2017  . Alcohol use disorder, severe, dependence ([place]) 05/09/2017  . Major depressive disorder, recurrent severe without psychotic features ([place]) 05/08/2017  . Alcohol abuse   . Thumb laceration, right, subsequent encounter 08/23/2016  . Depression, major, single episode, moderate ([place]) 05/31/2016  . Generalized anxiety disorder 05/31/2016  . Melena   . Rectal polyp   . Problems with swallowing and mastication   . Attention deficit hyperactivity disorder (ADHD),  predominantly inattentive type 05/19/2014  . HTN (hypertension) 04/06/2013  . Allergic rhinitis 01/18/2013  . Family history of long QT syndrome 06/12/2012  . 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 Smoker 06/12/2012  . Common migraine 05/26/2012  . Fibromyalgia 05/26/2012    Past Medical History:  Diagnosis Date  . Biceps tendonitis on right 01/2014  . History of gastric ulcer    summer 2015  . Hypertension    has not been taking his medication; advised to start back on med. today  . 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 Shoulder impingement 01/2014   right    Past Surgical History:  Procedure Laterality Date  . CHOLECYSTECTOMY    . COLONOSCOPY WITH PROPOFOL N/A 05/14/2016   Procedure: COLONOSCOPY WITH PROPOFOL;  Surgeon: Lucilla Lame, MD;  Location: [facility];  Service: Endoscopy;  Laterality: N/A;  . ESOPHAGOGASTRODUODENOSCOPY (EGD) WITH PROPOFOL N/A 05/14/2016   Procedure: ESOPHAGOGASTRODUODENOSCOPY (EGD) WITH PROPOFOL;  Surgeon: Lucilla Lame, MD;  Location: [facility];  Service: Endoscopy;  Laterality: N/A;  . RESECTION DISTAL CLAVICAL Right 02/21/2014   Procedure: RESECTION DISTAL CLAVICAL;  Surgeon: Nita Sells, MD;  Location: [place];  Service: Orthopedics;  Laterality: Right;  . SHOULDER ARTHROSCOPY WITH SUBACROMIAL DECOMPRESSION Right 02/21/2014   Procedure: SHOULDER ARTHROSCOPY WITH SUBACROMIAL DECOMPRESSION, DISTAL CLAVICAL EXCISION, DEBRIDIMENT OF PARTIAL ROTATOR CUFF TEAR;  Surgeon: Nita Sells, MD;  Location: [place];  Service: Orthopedics;  Laterality: Right;  Right shoulder arthroscopy subacromial decompression, distal clavical excision    Social History   Socioeconomic History  . Marital status: Married    Spouse  name: Not on file  . Number of children: Not on file  . Years of education: Not on file  . Highest education level: Not on file  Occupational History  . Occupation: Sports administrator: Programme researcher, broadcasting/film[employer]video  Social Needs  . Financial resource strain: Not on file  . Food insecurity:    Worry: Not on file    Inability: Not on file  . Transportation needs:    Medical: Not on file    Non-medical: Not on file  Tobacco Use  . Smoking status: Former Smoker    Last attempt to quit: 09/24/2013    Years since quitting: 4.0  . Smokeless tobacco: Never Used  Substance and Sexual Activity  . Alcohol use: Yes    Alcohol/week: 0.0 standard drinks    Comment: 1/5 daily  . Drug use: No  . Sexual activity: Yes    Partners: Female  Lifestyle  . Physical activity:    Days per week: Not on file    Minutes per session: Not on file  . Stress: Not on file  Relationships  . Social connections:    Talks on phone: Not on file    Gets together: Not on file    Attends religious service: Not on file    Active member of club or organization: Not on file    Attends meetings of clubs or organizations: Not on file    Relationship status: Not on file  . Intimate partner violence:    Fear of current or ex partner: Not on file    Emotionally abused: Not on file    Physically abused: Not on file    Forced sexual activity: Not on file  Other Topics Concern  . Not on file  Social History Narrative   Mom was in hospital with long QT syndrome, then heart in [facility].   Sister has long QT syndrome   1st cousin: long QT syndrome          Family History  Problem Relation Age of Onset  . Diabetes Mother   . Heart disease Mother   . Long QT syndrome Mother   . Lung disease Father   . Diabetes Father     Allergies  Allergen Reactions  . Azithromycin Nausea And Vomiting  . Percocet [Oxycodone-Acetaminophen] Hives  . Vicodin [Hydrocodone-Acetaminophen] Hives  . Adhesive [Tape] Other (See Comments)    SKIN IRRITATION    Medication list reviewed and updated in full in [facility].   GEN: No acute illnesses, no fevers, chills. GI: No n/v/d, eating normally Pulm: No  SOB Interactive and getting along well at home.  Otherwise, ROS is as per the HPI.  Objective:   BP 100/72   Pulse 88   Temp 98.3 F (36.8 C) (Oral)   Ht 5\' 8"  (1.727 m)   Wt 175 lb (79.4 kg)   BMI 26.61 kg/m   GEN: WDWN, NAD, Non-toxic, A & O x 3 HEENT: Atraumatic, Normocephalic. Neck supple. No masses, No LAD. Ears and Nose: No external deformity. CV: RRR, No M/G/R. No JVD. No thrill. No extra heart sounds. PULM: CTA B, no wheezes, crackles, rhonchi. No retractions. No resp. distress. No accessory muscle use. EXTR: No c/c/e NEURO Normal gait.  PSYCH: Normally interactive. Conversant. Not depressed or anxious appearing.  Calm demeanor.   Extensive insect bites, lower extremities as well as arms.  Laboratory and Imaging Data:  Assessment and Plan:   Chigger bites  Alcohol use  disorder, severe, dependence ([place])  Attention deficit hyperactivity disorder (ADHD), predominantly inattentive type  Fibromyalgia  Generalized anxiety disorder  Major depressive disorder, recurrent severe without psychotic features ([place])  We will treat his trigger bites with a burst of some steroids.  He appears more stable compared to last times I have seen him.  Unfortunately he did not wish to follow-up with psychiatric care, and he is not doing any AA outpatient.  He did do some inpatient rehabilitation for his alcohol, and that is going well.  Follow-up: No follow-ups on file.  Meds ordered this encounter  Medications  . amphetamine-dextroamphetamine (ADDERALL) 15 MG tablet    Sig: Take 1 tablet by mouth 2 (two) times daily.    Dispense:  60 tablet    Refill:  0  . traZODone (DESYREL) 150 MG tablet    Sig: Take 1 tablet (150 mg total) by mouth at bedtime. For sleep    Dispense:  90 tablet    Refill:  1  . cholecalciferol (VITAMIN D) 1000 units tablet    Sig: Take 1 tablet (1,000 Units total) by mouth daily. For bone health    Dispense:  90 tablet    Refill:  1  . escitalopram  (LEXAPRO) 20 MG tablet    Sig: Take 1 tablet (20 mg total) by mouth daily. For depression    Dispense:  90 tablet    Refill:  1  . etodolac (LODINE) 300 MG capsule    Sig: Take 1 capsule (300 mg total) by mouth 2 (two) times daily.    Dispense:  90 capsule    Refill:  1  . hydrochlorothiazide (HYDRODIURIL) 25 MG tablet    Sig: Take 1 tablet (25 mg total) by mouth daily. For high blood pressure    Dispense:  90 tablet    Refill:  1  . etodolac (LODINE) 200 MG capsule    Sig: Take 1 capsule (200 mg total) by mouth 2 (two) times daily. For pain    Dispense:  90 capsule    Refill:  0  . hydrOXYzine (ATARAX/VISTARIL) 50 MG tablet    Sig: Take 1 tablet (50 mg total) by mouth every 6 (six) hours as needed for anxiety.    Dispense:  75 tablet    Refill:  1  . potassium chloride SA (K-DUR,KLOR-CON) 20 MEQ tablet    Sig: Take 1 tablet (20 mEq total) by mouth daily. For low potassium    Dispense:  90 tablet    Refill:  1  . ARIPiprazole (ABILIFY) 5 MG tablet    Sig: Take 1 tablet (5 mg total) by mouth daily. For mood control    Dispense:  90 tablet    Refill:  1  . triamcinolone cream (KENALOG) 0.1 %    Sig: Apply 1 application topically 2 (two) times daily.    Dispense:  454 g    Refill:  1  . predniSONE (DELTASONE) 20 MG tablet    Sig: 2 tabs po for 5 days, then 1 tab po for 5 days    Dispense:  15 tablet    Refill:  0  . azelastine (ASTELIN) 0.1 % nasal spray    Sig: PLACE 1 SPRAY INTO BOTH NOSTRILS 2 (TWO) TIMES DAILY    Dispense:  90 mL    Refill:  1   Signed,  Lenus Trauger T. Neeka Urista, MD   Allergies as of 10/01/2017      Reactions   Azithromycin Nausea And Vomiting  Percocet [oxycodone-acetaminophen] Hives   Vicodin [hydrocodone-acetaminophen] Hives   Adhesive [tape] Other (See Comments)   SKIN IRRITATION      Medication List        Accurate as of 10/01/17 11:59 PM. Always use your most recent med list.          amphetamine-dextroamphetamine 15 MG tablet Commonly  known as:  ADDERALL Take 1 tablet by mouth 2 (two) times daily.   ARIPiprazole 5 MG tablet Commonly known as:  ABILIFY Take 1 tablet (5 mg total) by mouth daily. For mood control   azelastine 0.1 % nasal spray Commonly known as:  ASTELIN PLACE 1 SPRAY INTO BOTH NOSTRILS 2 (TWO) TIMES DAILY   cholecalciferol 1000 units tablet Commonly known as:  VITAMIN D Take 1 tablet (1,000 Units total) by mouth daily. For bone health   escitalopram 20 MG tablet Commonly known as:  LEXAPRO Take 1 tablet (20 mg total) by mouth daily. For depression   etodolac 300 MG capsule Commonly known as:  LODINE Take 1 capsule (300 mg total) by mouth 2 (two) times daily.   etodolac 200 MG capsule Commonly known as:  LODINE Take 1 capsule (200 mg total) by mouth 2 (two) times daily. For pain   hydrochlorothiazide 12.5 MG capsule Commonly known as:  MICROZIDE Take 1 capsule (12.5 mg total) by mouth daily.   hydrochlorothiazide 25 MG tablet Commonly known as:  HYDRODIURIL Take 1 tablet (25 mg total) by mouth daily. For high blood pressure   hydrOXYzine 50 MG tablet Commonly known as:  ATARAX/VISTARIL Take 1 tablet (50 mg total) by mouth every 6 (six) hours as needed for anxiety.   potassium chloride SA 20 MEQ tablet Commonly known as:  K-DUR,KLOR-CON Take 1 tablet (20 mEq total) by mouth daily. For low potassium   predniSONE 20 MG tablet Commonly known as:  DELTASONE 2 tabs po for 5 days, then 1 tab po for 5 days   traZODone 150 MG tablet Commonly known as:  DESYREL Take 1 tablet (150 mg total) by mouth at bedtime. For sleep   triamcinolone cream 0.1 % Commonly known as:  KENALOG Apply 1 application topically 2 (two) times daily.

## 2017-10-03 ENCOUNTER — Encounter: Payer: Self-pay | Admitting: Family Medicine

## 2017-11-19 ENCOUNTER — Other Ambulatory Visit: Payer: Self-pay | Admitting: Family Medicine

## 2017-11-19 NOTE — Telephone Encounter (Signed)
Last office visit 10/01/2017 for chigger bites.  No future appointments.   Last refilled 10/01/2017 for #60 with no refills.

## 2017-12-23 ENCOUNTER — Other Ambulatory Visit: Payer: Self-pay | Admitting: Family Medicine

## 2017-12-23 NOTE — Telephone Encounter (Signed)
Last office visit 10/01/2017 for Chigger bites.  Last refilled 11/19/2017 for #60 with no refills. No future appointments.

## 2017-12-25 NOTE — Telephone Encounter (Signed)
I do not see OV in last year for ADD.. Is that correct?

## 2017-12-25 NOTE — Telephone Encounter (Signed)
Dr. Lorelei Pont listed it on dx list on office visit form  10/01/2017.

## 2018-02-07 DIAGNOSIS — R6889 Other general symptoms and signs: Secondary | ICD-10-CM | POA: Diagnosis not present

## 2018-02-21 DIAGNOSIS — H6981 Other specified disorders of Eustachian tube, right ear: Secondary | ICD-10-CM | POA: Diagnosis not present

## 2018-02-23 ENCOUNTER — Other Ambulatory Visit: Payer: Self-pay | Admitting: Family Medicine

## 2018-02-23 NOTE — Telephone Encounter (Signed)
Last office visit 10/01/2017 for chigger bites.  Last refilled 12/26/2017 for #60 with no refills by Dr. Diona Browner.  No future appointments.  Refill?

## 2018-03-11 ENCOUNTER — Other Ambulatory Visit: Payer: Self-pay | Admitting: Family Medicine

## 2018-03-11 NOTE — Telephone Encounter (Signed)
Last office visit 10/01/2017 for Chigger Bites.  Last refilled 10/01/2017 for #90 with no refills.  No future appointments.

## 2018-03-20 ENCOUNTER — Other Ambulatory Visit: Payer: Self-pay | Admitting: Family Medicine

## 2018-04-08 ENCOUNTER — Other Ambulatory Visit: Payer: Self-pay | Admitting: Family Medicine

## 2018-04-08 NOTE — Telephone Encounter (Signed)
Last office visit 10/01/2017 for Chigger bites.  Last refilled 02/23/2018 for #60 with no refills.  No future appointments.

## 2018-04-09 ENCOUNTER — Other Ambulatory Visit: Payer: Self-pay | Admitting: Family Medicine

## 2018-05-06 ENCOUNTER — Other Ambulatory Visit: Payer: Self-pay | Admitting: Family Medicine

## 2018-05-06 NOTE — Telephone Encounter (Signed)
Last office visit 10/01/2017 for Chigger bites.  Last refilled 04/08/2018 for #60 with no refills.  No future appointments.

## 2018-05-08 ENCOUNTER — Other Ambulatory Visit: Payer: Self-pay | Admitting: Family Medicine

## 2018-05-26 ENCOUNTER — Other Ambulatory Visit: Payer: Self-pay | Admitting: Family Medicine

## 2018-06-15 ENCOUNTER — Other Ambulatory Visit: Payer: Self-pay | Admitting: Family Medicine

## 2018-06-16 NOTE — Telephone Encounter (Signed)
Last office visit 10/01/2017 for chigger bites.  Last refilled 05/06/2018 for #60 with no refills.  No future appointments.

## 2018-07-15 ENCOUNTER — Other Ambulatory Visit: Payer: Self-pay | Admitting: Family Medicine

## 2018-07-15 NOTE — Telephone Encounter (Signed)
Please schedule CPE with fasting labs prior or medication refill appointment with Dr. Lorelei Pont. Can be virtual if needed.

## 2018-07-17 NOTE — Telephone Encounter (Signed)
Spoke with spouse she stated she would check with pt to see when he can do appointment.  She is aware we can do virtual appointment .  She stated it may be Monday before they call back to schedule

## 2018-07-17 NOTE — Telephone Encounter (Signed)
Can wait until an appointment is scheduled

## 2018-07-17 NOTE — Telephone Encounter (Signed)
Ok to refill or wait until appointment is scheduled?

## 2018-07-20 ENCOUNTER — Ambulatory Visit (INDEPENDENT_AMBULATORY_CARE_PROVIDER_SITE_OTHER): Payer: BLUE CROSS/BLUE SHIELD | Admitting: Family Medicine

## 2018-07-20 ENCOUNTER — Encounter: Payer: Self-pay | Admitting: Family Medicine

## 2018-07-20 DIAGNOSIS — F411 Generalized anxiety disorder: Secondary | ICD-10-CM | POA: Diagnosis not present

## 2018-07-20 DIAGNOSIS — F332 Major depressive disorder, recurrent severe without psychotic features: Secondary | ICD-10-CM

## 2018-07-20 DIAGNOSIS — F900 Attention-deficit hyperactivity disorder, predominantly inattentive type: Secondary | ICD-10-CM | POA: Diagnosis not present

## 2018-07-20 NOTE — Telephone Encounter (Signed)
I have been refilling his adderrall 1 month at a time

## 2018-07-20 NOTE — Progress Notes (Signed)
Raymond [name] [name], MD Primary [facility] and [place] at [facility] [place], [address] Phone: [phone]  FAX: [phone]  Raymond [name] - 43 y.o. male  MRN [id number]  Date of Birth: [birth year]  Visit Date: 07/20/2018  PCP: [name], MD  Referred by: [name], MD Chief Complaint  Patient presents with  . Medication Refill   Virtual Visit via Video Note:  I connected with  Raymond [name] on 07/20/2018  3:40 PM EDT by a video enabled telemedicine application and verified that I am speaking with the correct person using two identifiers.   Location patient: home computer, tablet, or smartphone Location provider: work or home office Consent: Verbal consent directly obtained from Raymond [name]. Persons participating in the virtual visit: patient, provider  I discussed the limitations of evaluation and management by telemedicine and the availability of in person appointments. The patient expressed understanding and agreed to proceed.  History of Present Illness:  He is a well-known patient, and he has a history of depression as well as ADD.  He also has some anxiety.  A little bit over a years ago he had a behavioral health admission for suicidal gesture.  This point he has been very compliant with taking his medication, and has not had any kind of recurrent depression at all.  He also does have a history of alcoholism which led up to this prior admission, but he has been completely abstinent for some time.  ADD is stable.  Review of Systems as above: See pertinent positives and pertinent negatives per HPI No acute distress verbally  Past Medical History, Surgical History, Social History, Family History, Problem List, Medications, and Allergies have been reviewed and updated if relevant.   Observations/Objective/Exam:  An attempt was made to discern vital signs over the phone and per patient if  applicable and possible.   General:    Alert, Oriented, appears well and in no acute distress HEENT:     Atraumatic, conjunctiva clear, no obvious abnormalities on inspection of external nose and ears.  Neck:    Normal movements of the head and neck Pulmonary:     On inspection no signs of respiratory distress, breathing rate appears normal, no obvious gross SOB, gasping or wheezing Cardiovascular:    No obvious cyanosis Musculoskeletal:    Moves all visible extremities without noticeable abnormality Psych / Neurological:     Pleasant and cooperative, no obvious depression or anxiety, speech and thought processing grossly intact  Assessment and Plan:  Attention deficit hyperactivity disorder (ADHD), predominantly inattentive type  Generalized anxiety disorder  Major depressive disorder, recurrent severe without psychotic features ([place])  >10 minutes spent in face to face time with patient, >50% spent in counselling or coordination of care: At this point he is stable.  ADD and depression are both doing fine.  I did refill his Adderall as well as his Lexapro  I discussed the assessment and treatment plan with the patient. The patient was provided an opportunity to ask questions and all were answered. The patient agreed with the plan and demonstrated an understanding of the instructions.   The patient was advised to call back or seek an in-person evaluation if the symptoms worsen or if the condition fails to improve as anticipated.  Follow-up: prn unless noted otherwise below No follow-ups on file.  No orders of the defined types were placed in this encounter.  No orders of the defined  types were placed in this encounter.   Signed,  Maud Deed. Eriberto Felch, MD

## 2018-08-03 ENCOUNTER — Other Ambulatory Visit: Payer: Self-pay | Admitting: Family Medicine

## 2018-08-04 ENCOUNTER — Encounter: Payer: Self-pay | Admitting: Family Medicine

## 2018-08-04 ENCOUNTER — Telehealth: Payer: Self-pay

## 2018-08-04 NOTE — Telephone Encounter (Signed)
Latina from [facility] left v/m that pt is requesting refill 3 wks early for trazodone 150 mg; instructions on med list is take one tab po at hs for sleep. Pt advised Latina that some nights he has to take two tabs of Trazodone at hs. Latina request cb after Dr Lorelei Pont reviews and if Dr Lorelei Pont approves the refill 3 wks early or can new rx be sent in with instructions to take 1 - 2 tabs at hs prn of trazodone 150 mg. Last refilled # 90 x 1 on 08/03/18.Please advise.

## 2018-08-05 MED ORDER — TRAZODONE HCL 150 MG PO TABS: ORAL_TABLET | ORAL | 1 refills | Status: DC

## 2018-08-05 NOTE — Telephone Encounter (Signed)
It is ok to increase dosage to 1-2 tablets po qhs prn insomnia, #180, 1 ref  Ok to fill early

## 2018-08-05 NOTE — Telephone Encounter (Signed)
Prescription for trazodone sent to [facility] as instructed by Dr. Lorelei Pont.

## 2018-08-12 ENCOUNTER — Other Ambulatory Visit: Payer: Self-pay | Admitting: Family Medicine

## 2018-08-12 NOTE — Telephone Encounter (Signed)
Raymond [name] is scheduled to see you tomorrow for jammed finger.  Last refilled 07/20/2018 for #60 with no refills.

## 2018-08-13 ENCOUNTER — Ambulatory Visit (INDEPENDENT_AMBULATORY_CARE_PROVIDER_SITE_OTHER): Payer: BC Managed Care – PPO | Admitting: Family Medicine

## 2018-08-13 ENCOUNTER — Encounter: Payer: Self-pay | Admitting: Family Medicine

## 2018-08-13 ENCOUNTER — Other Ambulatory Visit: Payer: Self-pay

## 2018-08-13 ENCOUNTER — Ambulatory Visit (INDEPENDENT_AMBULATORY_CARE_PROVIDER_SITE_OTHER)
Admission: RE | Admit: 2018-08-13 | Discharge: 2018-08-13 | Disposition: A | Discharge status: Home / Self Care | Payer: BC Managed Care – PPO | Source: Ambulatory Visit | Attending: Family Medicine | Admitting: Family Medicine

## 2018-08-13 ENCOUNTER — Ambulatory Visit: Payer: BLUE CROSS/BLUE SHIELD | Admitting: Family Medicine

## 2018-08-13 VITALS — BP 110/82 | HR 91 | Temp 98.2°F | Ht 68.0 in | Wt 182.8 lb

## 2018-08-13 DIAGNOSIS — M79642 Pain in left hand: Secondary | ICD-10-CM | POA: Diagnosis not present

## 2018-08-13 DIAGNOSIS — M79645 Pain in left finger(s): Secondary | ICD-10-CM

## 2018-08-13 NOTE — Progress Notes (Signed)
Raymond Dunsworth T. Attila Mccarthy, MD Primary Care and [place] at [facility] [place], [address] Phone: [phone]  FAX: [phone]  Raymond [name] - 43 y.o. male  MRN [id number]  Date of Birth: [birth year]  Visit Date: 08/13/2018  PCP: Raymond Loffler, MD  Referred by: Raymond Loffler, MD  Chief Complaint  Patient presents with  . Finger Injury    Left Middle Finger   Subjective:   Raymond [name] is a 43 y.o. very pleasant male patient who presents with the following:  L 3rd digit injury:  Slammed his finger in a door.  Got swollen black and blue after the initial injury, which was about a week ago.  It has improved somewhat, but he still has some swelling and some pain in his knuckles, so he wanted to get this checked out.  Past Medical History, Surgical History, Social History, Family History, Problem List, Medications, and Allergies have been reviewed and updated if relevant.  Patient Active Problem List   Diagnosis Date Noted  . Alcohol use disorder, severe, dependence ([place]) 05/09/2017  . Major depressive disorder, recurrent severe without psychotic features ([place]) 05/08/2017  . Thumb laceration, right, subsequent encounter 08/23/2016  . Depression, major, single episode, moderate ([place]) 05/31/2016  . Generalized anxiety disorder 05/31/2016  . 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 HTN (hypertension) 04/06/2013  . Allergic rhinitis 01/18/2013  . Family history of long QT syndrome 06/12/2012  . 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 Smoker 06/12/2012  . Common migraine 05/26/2012  . Fibromyalgia 05/26/2012    Past Medical History:  Diagnosis Date  . Biceps tendonitis on right 01/2014  . History of gastric ulcer    summer 2015  . Hypertension    has not been taking his medication; advised to start back on med. today  . 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 Shoulder impingement 01/2014   right    Past Surgical History:  Procedure Laterality Date  . CHOLECYSTECTOMY    . COLONOSCOPY WITH PROPOFOL N/A 05/14/2016   Procedure: COLONOSCOPY WITH PROPOFOL;  Surgeon: Lucilla Lame, MD;  Location: [facility] ENDOSCOPY;  Service: Endoscopy;  Laterality: N/A;  . ESOPHAGOGASTRODUODENOSCOPY (EGD) WITH PROPOFOL N/A 05/14/2016   Procedure: ESOPHAGOGASTRODUODENOSCOPY (EGD) WITH PROPOFOL;  Surgeon: Lucilla Lame, MD;  Location: [facility] ENDOSCOPY;  Service: Endoscopy;  Laterality: N/A;  . RESECTION DISTAL CLAVICAL Right 02/21/2014   Procedure: RESECTION DISTAL CLAVICAL;  Surgeon: Nita Sells, MD;  Location: [facility];  Service: Orthopedics;  Laterality: Right;  . SHOULDER ARTHROSCOPY WITH SUBACROMIAL DECOMPRESSION Right 02/21/2014   Procedure: SHOULDER ARTHROSCOPY WITH SUBACROMIAL DECOMPRESSION, DISTAL CLAVICAL EXCISION, DEBRIDIMENT OF PARTIAL ROTATOR CUFF TEAR;  Surgeon: Nita Sells, MD;  Location: [facility];  Service: Orthopedics;  Laterality: Right;  Right shoulder arthroscopy subacromial decompression, distal clavical excision    Social History   Socioeconomic History  . Marital status: Married    Spouse name: Not on file  . Number of children: Not on file  . Years of education: Not on file  . Highest education level: Not on file  Occupational History  . Occupation: Programmer, systems: Programme researcher, broadcasting/film[employer]video  Social Needs  . Financial resource strain: Not on file  . Food insecurity    Worry: Not on file    Inability: Not on file  . Transportation needs    Medical: Not on file    Non-medical: Not  on file  Tobacco Use  . Smoking status: Former Smoker    Quit date: 09/24/2013    Years since quitting: 4.8  . Smokeless tobacco: Never Used  Substance and Sexual Activity  . Alcohol use: Yes    Alcohol/week: 0.0 standard drinks    Comment: 1/5 daily  . Drug use: No  . Sexual activity: Yes    Partners:  Female  Lifestyle  . Physical activity    Days per week: Not on file    Minutes per session: Not on file  . Stress: Not on file  Relationships  . Social Herbalist on phone: Not on file    Gets together: Not on file    Attends religious service: Not on file    Active member of club or organization: Not on file    Attends meetings of clubs or organizations: Not on file    Relationship status: Not on file  . Intimate partner violence    Fear of current or ex partner: Not on file    Emotionally abused: Not on file    Physically abused: Not on file    Forced sexual activity: Not on file  Other Topics Concern  . Not on file  Social History Narrative   Mom was in hospital with long QT syndrome, then heart in [facility].   Sister has long QT syndrome   1st cousin: long QT syndrome          Family History  Problem Relation Age of Onset  . Diabetes Mother   . Heart disease Mother   . Long QT syndrome Mother   . Lung disease Father   . Diabetes Father     Allergies  Allergen Reactions  . Azithromycin Nausea And Vomiting  . Percocet [Oxycodone-Acetaminophen] Hives  . Vicodin [Hydrocodone-Acetaminophen] Hives  . Adhesive [Tape] Other (See Comments)    SKIN IRRITATION    Medication list reviewed and updated in full in [facility].  GEN: No fevers, chills. Nontoxic. Primarily MSK c/o today. MSK: Detailed in the HPI GI: tolerating PO intake without difficulty Neuro: No numbness, parasthesias, or tingling associated. Otherwise the pertinent positives of the ROS are noted above.   Objective:   BP 110/82   Pulse 91   Temp 98.2 F (36.8 C) (Oral)   Ht 5\' 8"  (1.727 m)   Wt 182 lb 12 oz (82.9 kg)   BMI 27.79 kg/m    GEN: WDWN, NAD, Non-toxic, Alert & Oriented x 3 HEENT: Atraumatic, Normocephalic.  Ears and Nose: No external deformity. EXTR: No clubbing/cyanosis/edema NEURO: Normal gait.  PSYCH: Normally interactive. Conversant. Not depressed or anxious  appearing.  Calm demeanor.    Entirety of the left hand is nontender with the exception of the finger on the left middle, and this had some tenderness and swelling along the entirety of the finger, but all extensor and flexor tendons are intact.  Primary pain is at the MCP joint at the third.  Radiology: Dg Hand Complete Left  Result Date: 08/13/2018 CLINICAL DATA:  Closed hand in door with persistent pain EXAM: LEFT HAND - COMPLETE 3+ VIEW COMPARISON:  None. FINDINGS: Frontal, oblique, and lateral views were obtained. There is no appreciable fracture or dislocation. Joint spaces appear normal. No erosive change. IMPRESSION: No fracture or dislocation.  No evident arthropathy. Electronically Signed   By: Lowella Grip III M.D.   On: 08/13/2018 14:08     Assessment and Plan:  ICD-10-CM   1. Pain of left middle finger  M79.645 DG Hand Complete Left  2. Left hand pain  M79.642 DG Hand Complete Left   The radiological images were independently reviewed by myself in the office and results were reviewed with the patient. My independent interpretation of images: There is no evidence of acute fracture, dislocation, or osteoarthritis. Electronically Signed  By: Raymond Loffler, MD On: 08/13/2018  8:40 AM EDT   There is no fracture, conservative care.  I think that he would be okay doing nothing, but if he wanted to be could buddy tape this when he is at work.  Follow-up: No follow-ups on file.  No orders of the defined types were placed in this encounter.  Orders Placed This Encounter  Procedures  . DG Hand Complete Left    Signed,  Raymond Ignasiak T. Cloa Bushong, MD   Outpatient Encounter Medications as of 08/13/2018  Medication Sig  . ARIPiprazole (ABILIFY) 5 MG tablet TAKE 1 TABLET BY MOUTH DAILY. FOR MOOD CONTROL  . azelastine (ASTELIN) 0.1 % nasal spray PLACE 1 SPRAY INTO BOTH NOSTRILS 2 (TWO) TIMES DAILY  . cholecalciferol (VITAMIN D) 1000 units tablet Take 1 tablet (1,000 Units total)  by mouth daily. For bone health  . escitalopram (LEXAPRO) 20 MG tablet TAKE 1 TABLET BY MOUTH DAILY. FOR DEPRESSION  . etodolac (LODINE) 200 MG capsule TAKE 1 CAPSULE BY MOUTH 2 TIMES DAILY. FOR PAIN  . etodolac (LODINE) 300 MG capsule Take 1 capsule (300 mg total) by mouth 2 (two) times daily.  . hydrochlorothiazide (HYDRODIURIL) 25 MG tablet TAKE 1 TABLET BY MOUTH DAILY. FOR HIGH BLOOD PRESSURE  . hydrOXYzine (ATARAX/VISTARIL) 50 MG tablet TAKE 1 TABLET BY MOUTH EVERY 6 HOURS AS NEEDED FOR ANXIETY.  Marland Kitchen potassium chloride SA (K-DUR,KLOR-CON) 20 MEQ tablet Take 1 tablet (20 mEq total) by mouth daily. For low potassium  . traZODone (DESYREL) 150 MG tablet TAKE 1 TO 2 TABLET BY MOUTH AT BEDTIME FOR SLEEP  . triamcinolone cream (KENALOG) 0.1 % Apply 1 application topically 2 (two) times daily.  . [DISCONTINUED] amphetamine-dextroamphetamine (ADDERALL) 15 MG tablet TAKE 1 TABLET BY MOUTH 2 TIMES DAILY.  . [DISCONTINUED] hydrochlorothiazide (MICROZIDE) 12.5 MG capsule Take 1 capsule (12.5 mg total) by mouth daily.   No facility-administered encounter medications on file as of 08/13/2018.

## 2018-09-11 ENCOUNTER — Other Ambulatory Visit: Payer: Self-pay | Admitting: Family Medicine

## 2018-09-11 NOTE — Telephone Encounter (Signed)
Name of Medication: Adderall 15 mg Name of [place] or Written Date and Quantity:#60 on 08/13/18  Last Office Visit and Type: 07/20/18 ADHD Next Office Visit and Type: none scheduled Last Controlled Substance Agreement Date: none seen Last UDS:05/07/2017

## 2018-09-21 ENCOUNTER — Other Ambulatory Visit: Payer: Self-pay | Admitting: Family Medicine

## 2018-09-21 MED ORDER — AMPHETAMINE-DEXTROAMPHETAMINE 15 MG PO TABS: 1.0000 | ORAL_TABLET | Freq: Two times a day (BID) | ORAL | 0 refills | Status: DC

## 2018-09-21 NOTE — Telephone Encounter (Signed)
Last office visit 08/13/2018 for finger injury.  Last refilled 09/11/2018 for #60 with no refills but was set on print so it did not get sent to pharmacy.  Please resend electronically.

## 2018-09-23 ENCOUNTER — Encounter: Payer: Self-pay | Admitting: Family Medicine

## 2018-09-23 ENCOUNTER — Other Ambulatory Visit: Payer: Self-pay | Admitting: Family Medicine

## 2018-09-23 MED ORDER — ETODOLAC 500 MG PO TABS: 500.0000 mg | ORAL_TABLET | Freq: Two times a day (BID) | ORAL | 3 refills | Status: DC

## 2018-09-23 MED ORDER — ESCITALOPRAM OXALATE 20 MG PO TABS: 20.0000 mg | ORAL_TABLET | Freq: Every day | ORAL | 1 refills | Status: DC

## 2018-09-23 NOTE — Telephone Encounter (Signed)
Duplicate Request.

## 2018-09-24 ENCOUNTER — Encounter: Payer: Self-pay | Admitting: Family Medicine

## 2018-09-25 MED ORDER — SILDENAFIL CITRATE 100 MG PO TABS: 50.0000 mg | ORAL_TABLET | Freq: Every day | ORAL | 5 refills | Status: DC | PRN

## 2018-09-25 NOTE — Telephone Encounter (Signed)
Not on current medication list. Please advise.

## 2018-10-20 ENCOUNTER — Other Ambulatory Visit: Payer: Self-pay | Admitting: Family Medicine

## 2018-10-20 MED ORDER — AMPHETAMINE-DEXTROAMPHETAMINE 15 MG PO TABS: 1.0000 | ORAL_TABLET | Freq: Two times a day (BID) | ORAL | 0 refills | Status: DC

## 2018-10-20 NOTE — Telephone Encounter (Signed)
Multiple ov regarding this and 3/19 UDS

## 2018-10-20 NOTE — Telephone Encounter (Signed)
Name of Medication:Adderall 15 mg  Name of Pharmacy: [facility] Last Fill or Written Date and Quantity: # 4 on 09/21/18 Last Office Visit and Type: 07/20/18 ADHD Next Office Visit and Type: none scheduled Last Controlled Substance Agreement Date: none Last [id number]

## 2018-11-12 DIAGNOSIS — E291 Testicular hypofunction: Secondary | ICD-10-CM | POA: Diagnosis not present

## 2018-11-16 ENCOUNTER — Other Ambulatory Visit: Payer: Self-pay | Admitting: Family Medicine

## 2018-11-16 ENCOUNTER — Telehealth: Payer: Self-pay

## 2018-11-16 DIAGNOSIS — R6882 Decreased libido: Secondary | ICD-10-CM | POA: Diagnosis not present

## 2018-11-16 DIAGNOSIS — Z1339 Encounter for screening examination for other mental health and behavioral disorders: Secondary | ICD-10-CM | POA: Diagnosis not present

## 2018-11-16 DIAGNOSIS — D751 Secondary polycythemia: Secondary | ICD-10-CM | POA: Diagnosis not present

## 2018-11-16 DIAGNOSIS — Z72820 Sleep deprivation: Secondary | ICD-10-CM | POA: Diagnosis not present

## 2018-11-16 DIAGNOSIS — M2550 Pain in unspecified joint: Secondary | ICD-10-CM | POA: Diagnosis not present

## 2018-11-16 DIAGNOSIS — Z1331 Encounter for screening for depression: Secondary | ICD-10-CM | POA: Diagnosis not present

## 2018-11-16 MED ORDER — AMPHETAMINE-DEXTROAMPHETAMINE 15 MG PO TABS: 1.0000 | ORAL_TABLET | Freq: Two times a day (BID) | ORAL | 0 refills | Status: DC

## 2018-11-16 NOTE — Telephone Encounter (Signed)
Kenney Houseman pts wife (DPR signed) said that pt was seen at [facility] MD for hormone testing and dx with polycythemia; pts hgb,hct,wbc and RBC was elevated; pt's wife is going to fax copy of labs to [facility] CMA attention [phone] by 1020 AM today. Pt is already scheduled for phlebotomy on 11/17/18 thru Verdis Prime PA at [facility] MD. Kenney Houseman said [facility] was going to refer pt to doctor in [place] to ck for lyme disease due to concern for previous tick bites and [place]. Kenney Houseman wants to know if Dr Lorelei Pont could test for lyme's disease. [name] request cb.

## 2018-11-16 NOTE — Telephone Encounter (Signed)
Last office visit 08/13/2018 for finger injury.  Last refilled 090/02/2018 for #60 with no refills.  No future appointments.  UDS/Contract 01/10/2015.

## 2018-11-16 NOTE — Telephone Encounter (Signed)
Please make him an office visit

## 2018-11-16 NOTE — Telephone Encounter (Signed)
Left message for [name] with below information from Dr. Lorelei Pont.  I did receive his lab results she faxed over and placed them in Dr. Lillie Fragmin in box to review.

## 2018-11-16 NOTE — Telephone Encounter (Signed)
I would not without looking at his case completely including all records, tests, and what [facility] MD was trying to treat.   If he has them, then I am happy to see him face to face in the office.

## 2018-11-16 NOTE — Telephone Encounter (Signed)
Appointment 9/30 Spouse aware

## 2018-11-18 ENCOUNTER — Other Ambulatory Visit: Payer: Self-pay

## 2018-11-18 ENCOUNTER — Ambulatory Visit (INDEPENDENT_AMBULATORY_CARE_PROVIDER_SITE_OTHER): Payer: BC Managed Care – PPO | Admitting: Family Medicine

## 2018-11-18 ENCOUNTER — Encounter: Payer: Self-pay | Admitting: Family Medicine

## 2018-11-18 VITALS — BP 132/88 | HR 89 | Temp 98.4°F | Ht 68.0 in | Wt 181.8 lb

## 2018-11-18 DIAGNOSIS — R21 Rash and other nonspecific skin eruption: Secondary | ICD-10-CM

## 2018-11-18 DIAGNOSIS — D751 Secondary polycythemia: Secondary | ICD-10-CM

## 2018-11-18 DIAGNOSIS — Z23 Encounter for immunization: Secondary | ICD-10-CM

## 2018-11-18 DIAGNOSIS — W57XXXA Bitten or stung by nonvenomous insect and other nonvenomous arthropods, initial encounter: Secondary | ICD-10-CM | POA: Diagnosis not present

## 2018-11-18 LAB — CBC WITH DIFFERENTIAL/PLATELET
Basophils Absolute: 0.1 10*3/uL (ref 0.0–0.1)
Basophils Relative: 0.6 % (ref 0.0–3.0)
Eosinophils Absolute: 0.1 10*3/uL (ref 0.0–0.7)
Eosinophils Relative: 1.3 % (ref 0.0–5.0)
HCT: 46.8 % (ref 39.0–52.0)
Hemoglobin: 16.1 g/dL (ref 13.0–17.0)
Lymphocytes Relative: 39.5 % (ref 12.0–46.0)
Lymphs Abs: 3.6 10*3/uL (ref 0.7–4.0)
MCHC: 34.4 g/dL (ref 30.0–36.0)
MCV: 89.7 fl (ref 78.0–100.0)
Monocytes Absolute: 0.6 10*3/uL (ref 0.1–1.0)
Monocytes Relative: 6.1 % (ref 3.0–12.0)
Neutro Abs: 4.8 10*3/uL (ref 1.4–7.7)
Neutrophils Relative %: 52.5 % (ref 43.0–77.0)
Platelets: 246 10*3/uL (ref 150.0–400.0)
RBC: 5.22 Mil/uL (ref 4.22–5.81)
RDW: 13.2 % (ref 11.5–15.5)
WBC: 9.1 10*3/uL (ref 4.0–10.5)

## 2018-11-18 NOTE — Addendum Note (Signed)
Addended by: Carter Kitten on: 11/18/2018 09:00 AM   Modules accepted: Orders

## 2018-11-18 NOTE — Progress Notes (Signed)
Raymond [name] T. Raymond Donado, MD Primary Care and [place] at [facility] [place], [address] Phone: [phone]  FAX: [phone]  Raymond [name] - 43 y.o. male  MRN [id number]  Date of Birth: 14-Jul-1975  Visit Date: 11/18/2018  PCP: Raymond Loffler, MD  Referred by: Raymond Loffler, MD  Chief Complaint  Patient presents with  . Follow-up    Labs from [facility] ?test for lyme disease   Subjective:   Raymond [name] is a 43 y.o. very pleasant male patient who presents with the following:  Raymond [name] is well-known.  [facility] [place] for some weight loss and hormonal manipulation.  He comes in today and has an elevated hemoglobin and hematocrit.  This is happened before with relatively minimal and modest elevation.  They have given him some Clomid to try.  He actually has a completely normal testosterone and free testosterone.  This was checked at 8 AM.  Total testosterone is in the 600s range.  High Hgb, HCT  Normal T  ? Lyme, he also thinks that he is pulled out 30 ticks off of his body this summer and he like to be tested for tickborne illness.  Past Medical History, Surgical History, Social History, Family History, Problem List, Medications, and Allergies have been reviewed and updated if relevant.  Patient Active Problem List   Diagnosis Date Noted  . Alcohol use disorder, severe, dependence ([place]) 05/09/2017  . Major depressive disorder, recurrent severe without psychotic features ([place]) 05/08/2017  . Thumb laceration, right, subsequent encounter 08/23/2016  . Depression, major, single episode, moderate ([place]) 05/31/2016  . Generalized anxiety disorder 05/31/2016  . 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 HTN (hypertension) 04/06/2013  . Allergic rhinitis 01/18/2013  . Family history of long QT syndrome 06/12/2012  . Cardiac arrest-aborted 06/12/2012   . Smoker 06/12/2012  . Common migraine 05/26/2012  . Fibromyalgia 05/26/2012    Past Medical History:  Diagnosis Date  . Biceps tendonitis on right 01/2014  . History of gastric ulcer    summer 2015  . Hypertension    has not been taking his medication; advised to start back on med. today  . 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 Shoulder impingement 01/2014   right    Past Surgical History:  Procedure Laterality Date  . CHOLECYSTECTOMY    . COLONOSCOPY WITH PROPOFOL N/A 05/14/2016   Procedure: COLONOSCOPY WITH PROPOFOL;  Surgeon: Lucilla Lame, MD;  Location: [facility] ENDOSCOPY;  Service: Endoscopy;  Laterality: N/A;  . ESOPHAGOGASTRODUODENOSCOPY (EGD) WITH PROPOFOL N/A 05/14/2016   Procedure: ESOPHAGOGASTRODUODENOSCOPY (EGD) WITH PROPOFOL;  Surgeon: Lucilla Lame, MD;  Location: [facility] ENDOSCOPY;  Service: Endoscopy;  Laterality: N/A;  . RESECTION DISTAL CLAVICAL Right 02/21/2014   Procedure: RESECTION DISTAL CLAVICAL;  Surgeon: Nita Sells, MD;  Location: [place];  Service: Orthopedics;  Laterality: Right;  . SHOULDER ARTHROSCOPY WITH SUBACROMIAL DECOMPRESSION Right 02/21/2014   Procedure: SHOULDER ARTHROSCOPY WITH SUBACROMIAL DECOMPRESSION, DISTAL CLAVICAL EXCISION, DEBRIDIMENT OF PARTIAL ROTATOR CUFF TEAR;  Surgeon: Nita Sells, MD;  Location: [facility] [place];  Service: Orthopedics;  Laterality: Right;  Right shoulder arthroscopy subacromial decompression, distal clavical excision    Social History   Socioeconomic History  . Marital status: Married    Spouse name: Not on file  . Number of children: Not on file  . Years of education: Not on file  .  Highest education level: Not on file  Occupational History  . Occupation: Programmer, systems: Programme researcher, broadcasting/film/video  Social Needs  . Financial resource strain: Not on file  . Food insecurity    Worry: Not on file    Inability: Not on file  . Transportation needs    Medical: Not on  file    Non-medical: Not on file  Tobacco Use  . Smoking status: Former Smoker    Quit date: 09/24/2013    Years since quitting: 5.1  . Smokeless tobacco: Never Used  Substance and Sexual Activity  . Alcohol use: Yes    Alcohol/week: 0.0 standard drinks    Comment: 1/5 daily  . Drug use: No  . Sexual activity: Yes    Partners: Female  Lifestyle  . Physical activity    Days per week: Not on file    Minutes per session: Not on file  . Stress: Not on file  Relationships  . Social Herbalist on phone: Not on file    Gets together: Not on file    Attends religious service: Not on file    Active member of club or organization: Not on file    Attends meetings of clubs or organizations: Not on file    Relationship status: Not on file  . Intimate partner violence    Fear of current or ex partner: Not on file    Emotionally abused: Not on file    Physically abused: Not on file    Forced sexual activity: Not on file  Other Topics Concern  . Not on file  Social History Narrative   Mom was in hospital with long QT syndrome, then heart in [place] [facility]   Sister has long QT syndrome   1st cousin: long QT syndrome          Family History  Problem Relation Age of Onset  . Diabetes Mother   . Heart disease Mother   . Long QT syndrome Mother   . Lung disease Father   . Diabetes Father     Allergies  Allergen Reactions  . Azithromycin Nausea And Vomiting  . Percocet [Oxycodone-Acetaminophen] Hives  . Vicodin [Hydrocodone-Acetaminophen] Hives  . Adhesive [Tape] Other (See Comments)    SKIN IRRITATION    Medication list reviewed and updated in full in [place].   GEN: No acute illnesses, no fevers, chills. GI: No n/v/d, eating normally Pulm: No SOB Interactive and getting along well at home.  Otherwise, ROS is as per the HPI.  Objective:   BP 132/88   Pulse 89   Temp 98.4 F (36.9 C) (Temporal)   Ht 5\' 8"  (1.727 m)   Wt 181 lb 12 oz (82.4 kg)   SpO2  96%   BMI 27.63 kg/m   GEN: WDWN, NAD, Non-toxic, A & O x 3 HEENT: Atraumatic, Normocephalic. Neck supple. No masses, No LAD. Ears and Nose: No external deformity. CV: RRR, No M/G/R. No JVD. No thrill. No extra heart sounds. PULM: CTA B, no wheezes, crackles, rhonchi. No retractions. No resp. distress. No accessory muscle use. EXTR: No c/c/e NEURO Normal gait.  PSYCH: Normally interactive. Conversant. Not depressed or anxious appearing.  Calm demeanor.   Laboratory and Imaging Data:  Assessment and Plan:     ICD-10-CM   1. Polycythemia  D75.1 CBC with Differential/Platelet  2. Tick bite, initial encounter  W57.XXXA B. burgdorfi antibodies by [facility]    Rocky mtn spotted fvr abs  pnl(IgG+IgM)    Ehrlichia antibody panel  3. Rash  R21 B. burgdorfi antibodies by WB    Rocky mtn spotted fvr abs pnl(IgG+IgM)    Ehrlichia antibody panel   Following national endocrine guidelines, the patient does not qualify for hypergonadism.  Completely normal testosterone and gonadal function.  Strongly recommended that he does not do additional testosterone therapy including Clomid.  Multiple tick bites, check for tickborne illness.  He did have a rash along with these and generalized not feeling well.  Follow-up: No follow-ups on file.  No orders of the defined types were placed in this encounter.  Orders Placed This Encounter  Procedures  . CBC with Differential/Platelet  . B. burgdorfi antibodies by WB  . Rocky mtn spotted fvr abs pnl(IgG+IgM)  . Ehrlichia antibody panel    Signed,  Epifanio Labrador T. Beda Dula, MD   Outpatient Encounter Medications as of 11/18/2018  Medication Sig  . amphetamine-dextroamphetamine (ADDERALL) 15 MG tablet Take 1 tablet by mouth 2 (two) times daily.  . ARIPiprazole (ABILIFY) 5 MG tablet TAKE 1 TABLET BY MOUTH DAILY. FOR MOOD CONTROL  . azelastine (ASTELIN) 0.1 % nasal spray PLACE 1 SPRAY INTO BOTH NOSTRILS 2 (TWO) TIMES DAILY  . cholecalciferol (VITAMIN D) 1000  units tablet Take 1 tablet (1,000 Units total) by mouth daily. For bone health  . escitalopram (LEXAPRO) 20 MG tablet Take 1 tablet (20 mg total) by mouth daily.  Marland Kitchen etodolac (LODINE) 500 MG tablet Take 1 tablet (500 mg total) by mouth 2 (two) times daily.  . hydrochlorothiazide (HYDRODIURIL) 25 MG tablet TAKE 1 TABLET BY MOUTH DAILY. FOR HIGH BLOOD PRESSURE  . hydrOXYzine (ATARAX/VISTARIL) 50 MG tablet TAKE 1 TABLET BY MOUTH EVERY 6 HOURS AS NEEDED FOR ANXIETY.  Marland Kitchen potassium chloride SA (K-DUR,KLOR-CON) 20 MEQ tablet Take 1 tablet (20 mEq total) by mouth daily. For low potassium  . sildenafil (VIAGRA) 100 MG tablet Take 0.5-1 tablets (50-100 mg total) by mouth daily as needed for erectile dysfunction.  . traZODone (DESYREL) 150 MG tablet TAKE 1 TO 2 TABLET BY MOUTH AT BEDTIME FOR SLEEP  . triamcinolone cream (KENALOG) 0.1 % Apply 1 application topically 2 (two) times daily.  . [DISCONTINUED] amphetamine-dextroamphetamine (ADDERALL) 15 MG tablet TAKE 1 TABLET BY MOUTH 2 TIMES DAILY.   No facility-administered encounter medications on file as of 11/18/2018.

## 2018-11-20 LAB — B. BURGDORFI ANTIBODIES BY WB

## 2018-11-20 LAB — EHRLICHIA ANTIBODY PANEL
E. CHAFFEENSIS AB IGG: <1:64 {titer}
E. CHAFFEENSIS AB IGM: <1:20 {titer}

## 2018-11-20 LAB — ROCKY MTN SPOTTED FVR ABS PNL(IGG+IGM)
RMSF IgG: NOT DETECTED
RMSF IgM: NOT DETECTED

## 2018-11-26 ENCOUNTER — Encounter: Payer: Self-pay | Admitting: Family Medicine

## 2018-12-29 ENCOUNTER — Other Ambulatory Visit: Payer: Self-pay | Admitting: Family Medicine

## 2018-12-29 NOTE — Telephone Encounter (Signed)
Last office visit 11/18/2018 for follow up labs from [facility].  Last refilled 11/16/2018 for #60 with no refills.  No future appointments.

## 2019-01-25 ENCOUNTER — Other Ambulatory Visit: Payer: Self-pay | Admitting: Family Medicine

## 2019-01-25 NOTE — Telephone Encounter (Signed)
Last office visit 11/18/2018 for polycythemia.  Last refilled 12/29/2018 for #60 with no refills.  No future appointments.

## 2019-02-01 ENCOUNTER — Other Ambulatory Visit: Payer: Self-pay | Admitting: Family Medicine

## 2019-03-01 ENCOUNTER — Other Ambulatory Visit: Payer: Self-pay | Admitting: Family Medicine

## 2019-03-01 NOTE — Telephone Encounter (Signed)
Last office visit 11/18/2018 for polycythemia, tick bite & rash.  Last refilled 01/25/2019 for #60 with no refills.  No future appointments.

## 2019-03-12 DIAGNOSIS — D485 Neoplasm of uncertain behavior of skin: Secondary | ICD-10-CM | POA: Diagnosis not present

## 2019-03-12 DIAGNOSIS — L739 Follicular disorder, unspecified: Secondary | ICD-10-CM | POA: Diagnosis not present

## 2019-03-16 ENCOUNTER — Other Ambulatory Visit: Payer: Self-pay | Admitting: Family Medicine

## 2019-03-16 NOTE — Telephone Encounter (Signed)
Last office visit 11/18/2018 for Polycythemia.  Last refilled 03/01/2019 for #60 with no refills.  I tried to deny refill because it is too early to refill but it states I am not authorized to do that.

## 2019-03-26 ENCOUNTER — Other Ambulatory Visit: Payer: Self-pay | Admitting: Family Medicine

## 2019-03-26 NOTE — Telephone Encounter (Signed)
These medications were last filled 04/2018 with 1 refill.... last OV 10/2018 acute... please advise if okay to refill and when would you like pt to be seen again for f/u or CPE so we can get that scheduled

## 2019-04-01 ENCOUNTER — Other Ambulatory Visit: Payer: Self-pay | Admitting: Family Medicine

## 2019-04-01 NOTE — Telephone Encounter (Signed)
Last office visit 11/18/2018 for polycythemia.  Last refilled 03/01/2019 for #60 with no refills.  No future appointments.

## 2019-04-08 ENCOUNTER — Other Ambulatory Visit: Payer: Self-pay | Admitting: Family Medicine

## 2019-04-08 NOTE — Telephone Encounter (Signed)
Last filled 07/20/2019 #75 with 1 refill.... last OV Acute 10/2018, no upcoming appts... please advise

## 2019-04-28 ENCOUNTER — Other Ambulatory Visit: Payer: Self-pay | Admitting: Family Medicine

## 2019-04-28 NOTE — Telephone Encounter (Signed)
Last office visit 11/18/2018 for Polycythemia.  Last refilled 04/01/2019 for #60 with no refills.  No future appointments.

## 2019-05-13 ENCOUNTER — Encounter: Payer: Self-pay | Admitting: Family Medicine

## 2019-05-13 MED ORDER — AMOXICILLIN 875 MG PO TABS: 875.0000 mg | ORAL_TABLET | Freq: Two times a day (BID) | ORAL | 0 refills | Status: AC

## 2019-05-13 NOTE — Telephone Encounter (Signed)
Lennette Bihari, I am so sorry to hear about your daughter. Take care of your family.  I sent in some Amoxicillin for you.

## 2019-06-07 ENCOUNTER — Other Ambulatory Visit: Payer: Self-pay | Admitting: Family Medicine

## 2019-06-07 NOTE — Telephone Encounter (Signed)
Last office visit 11/18/2018 for polycythemia.  Last refilled 04/28/2019 for #60 with no refills.  UDS/Contract 01/10/2015.  No future appointments.

## 2019-06-08 NOTE — Telephone Encounter (Signed)
He is significantly overdue for his UDS.  I will reconcile this after his next office visit.

## 2019-07-09 ENCOUNTER — Other Ambulatory Visit: Payer: Self-pay | Admitting: Family Medicine

## 2019-07-09 NOTE — Telephone Encounter (Signed)
Last office visit 11/18/2018 for polycythemia.  Last refilled 04/20/20201 for #60 with no refills.  UDS/Contract 01/10/2015.  No future appointments

## 2019-07-27 ENCOUNTER — Other Ambulatory Visit: Payer: Self-pay | Admitting: Family Medicine

## 2019-08-16 ENCOUNTER — Other Ambulatory Visit: Payer: Self-pay | Admitting: Family Medicine

## 2019-08-16 NOTE — Telephone Encounter (Signed)
Last office visit 11/18/2018 for polycythemia, tick bite & rash.  Last refilled Adderall 07/11/2019 for #60 with no refills.  Etodolac 09/23/2018 for #60 with 3 refills.  Lexapro 09/23/2018 for #90 with 1 refill.  No future appointments.  Refill?

## 2019-08-17 ENCOUNTER — Encounter: Payer: Self-pay | Admitting: Family Medicine

## 2019-08-18 ENCOUNTER — Telehealth (INDEPENDENT_AMBULATORY_CARE_PROVIDER_SITE_OTHER): Payer: 59 | Admitting: Family Medicine

## 2019-08-18 ENCOUNTER — Encounter: Payer: Self-pay | Admitting: Family Medicine

## 2019-08-18 VITALS — Ht 68.0 in

## 2019-08-18 DIAGNOSIS — R059 Cough, unspecified: Secondary | ICD-10-CM

## 2019-08-18 DIAGNOSIS — J3489 Other specified disorders of nose and nasal sinuses: Secondary | ICD-10-CM

## 2019-08-18 DIAGNOSIS — Z20822 Contact with and (suspected) exposure to covid-19: Secondary | ICD-10-CM | POA: Diagnosis not present

## 2019-08-18 DIAGNOSIS — R05 Cough: Secondary | ICD-10-CM | POA: Diagnosis not present

## 2019-08-18 MED ORDER — AMOXICILLIN-POT CLAVULANATE 875-125 MG PO TABS
1.0000 | ORAL_TABLET | Freq: Two times a day (BID) | ORAL | 0 refills | Status: AC
Start: 2019-08-18 — End: 2019-08-28

## 2019-08-18 NOTE — Progress Notes (Addendum)
     Raymond Weekley T. Rmani Kapusta, MD Primary Care and [place] at [facility] [place], [address] Phone: [phone]  FAX: [phone]  [name] - 44 y.o. male  MRN [id number]  Date of Birth: March 29, 1975  Visit Date: 08/18/2019  PCP: Raymond Loffler, MD  Referred by: Raymond Loffler, MD Chief Complaint  Patient presents with  . Cough  . Facial Pain  . Ear Pain    Bilateral   Virtual Visit via Video Note:  I connected with  Raymond [name] on 08/18/2019 11:40 AM EDT by a video enabled telemedicine application and verified that I am speaking with the correct person using two identifiers.   Location patient: home computer, tablet, or smartphone Location provider: work or home office Consent: Verbal consent directly obtained from Raymond [name]. Persons participating in the virtual visit: patient, provider  I discussed the limitations of evaluation and management by telemedicine and the availability of in person appointments. The patient expressed understanding and agreed to proceed.  History of Present Illness:  He is a very nice young gentleman who I have known for quite some time.  He presents with approximate 5-day history of facial pain, pain in the sinuses, frontal sinus, some nasal drainage.  He also has some significant ear pain and ear fullness.  He is coughing occasionally with a productive cough.  Does not have any loss of taste or smell.  Review of Systems as above: See pertinent positives and pertinent negatives per HPI No acute distress verbally   Observations/Objective/Exam:  An attempt was made to discern vital signs over the phone and per patient if applicable and possible.   General:    Alert, Oriented, appears well and in no acute distress  Pulmonary:     On inspection no signs of respiratory distress.  Psych / Neurological:     Pleasant and cooperative.  Assessment and Plan:    ICD-10-CM     1. Sinus pain  J34.89   2. Nasal drainage  J34.89   3. Cough  R05    Level of Medical Decision-Making in this case is MODERATE.   Difficult to completely assess without an exam, certainly could be just a viral syndrome, certainly could have sinusitis.  Covid is possible.  Obtain COVID-19 testing today, and I am going to send him in some Augmentin to take just to cover for potential sinusitis.  If his Covid test is negative, then he can return to work.  Otherwise an extended quarantine would be needed.  I discussed the assessment and treatment plan with the patient. The patient was provided an opportunity to ask questions and all were answered. The patient agreed with the plan and demonstrated an understanding of the instructions.   The patient was advised to call back or seek an in-person evaluation if the symptoms worsen or if the condition fails to improve as anticipated.  Follow-up: prn unless noted otherwise below No follow-ups on file.  Meds ordered this encounter  Medications  . amoxicillin-clavulanate (AUGMENTIN) 875-125 MG tablet    Sig: Take 1 tablet by mouth 2 (two) times daily for 10 days.    Dispense:  20 tablet    Refill:  0   No orders of the defined types were placed in this encounter.   Signed,  Raymond Deed. Issai Werling, MD

## 2019-08-18 NOTE — Addendum Note (Signed)
Addended by: [name] on: 08/18/2019 11:33 AM   Modules accepted: Level of Service

## 2019-08-20 ENCOUNTER — Encounter: Payer: Self-pay | Admitting: Family Medicine

## 2019-08-22 MED ORDER — PREDNISONE 20 MG PO TABS
40.0000 mg | ORAL_TABLET | Freq: Every day | ORAL | 0 refills | Status: AC
Start: 2019-08-22 — End: 2019-08-29

## 2019-08-27 ENCOUNTER — Encounter: Payer: Self-pay | Admitting: Family Medicine

## 2019-08-30 ENCOUNTER — Encounter (HOSPITAL_COMMUNITY): Payer: Self-pay | Admitting: Emergency Medicine

## 2019-08-30 ENCOUNTER — Other Ambulatory Visit: Payer: Self-pay

## 2019-08-30 ENCOUNTER — Emergency Department (HOSPITAL_COMMUNITY)
Admission: EM | Admit: 2019-08-30 | Discharge: 2019-08-30 | Disposition: A | Discharge status: Home / Self Care | Payer: BC Managed Care – PPO | Attending: Emergency Medicine | Admitting: Emergency Medicine

## 2019-08-30 DIAGNOSIS — R519 Headache, unspecified: Secondary | ICD-10-CM | POA: Diagnosis not present

## 2019-08-30 DIAGNOSIS — H9203 Otalgia, bilateral: Secondary | ICD-10-CM | POA: Insufficient documentation

## 2019-08-30 DIAGNOSIS — Z5321 Procedure and treatment not carried out due to patient leaving prior to being seen by health care provider: Secondary | ICD-10-CM | POA: Insufficient documentation

## 2019-08-30 MED ORDER — OFLOXACIN 0.3 % OT SOLN
10.0000 [drp] | Freq: Every day | OTIC | 0 refills | Status: DC
Start: 2019-08-30 — End: 2019-08-30

## 2019-08-30 MED ORDER — LEVOFLOXACIN 500 MG PO TABS
500.0000 mg | ORAL_TABLET | Freq: Every day | ORAL | 0 refills | Status: AC
Start: 2019-08-30 — End: 2019-09-06

## 2019-08-30 MED ORDER — OFLOXACIN 0.3 % OT SOLN: 10.0000 [drp] | Freq: Every day | OTIC | 0 refills | Status: AC

## 2019-08-30 NOTE — ED Notes (Signed)
Registration gave this NT this patients armband and states the patient left

## 2019-08-30 NOTE — Addendum Note (Signed)
Addended by: [name] on: 08/30/2019 01:46 PM   Modules accepted: Orders

## 2019-08-30 NOTE — ED Triage Notes (Signed)
Pt c/o bilateral ear pain for more than a week, with sinus pressure pt is been on treatment with Amoxicillin and Levaquin with no relief.

## 2019-08-30 NOTE — Addendum Note (Signed)
Addended by: Carter Kitten on: 08/30/2019 04:14 PM   Modules accepted: Orders

## 2019-08-31 DIAGNOSIS — Z03818 Encounter for observation for suspected exposure to other biological agents ruled out: Secondary | ICD-10-CM | POA: Diagnosis not present

## 2019-08-31 DIAGNOSIS — B9689 Other specified bacterial agents as the cause of diseases classified elsewhere: Secondary | ICD-10-CM | POA: Diagnosis not present

## 2019-08-31 DIAGNOSIS — J019 Acute sinusitis, unspecified: Secondary | ICD-10-CM | POA: Diagnosis not present

## 2019-09-09 ENCOUNTER — Encounter: Payer: Self-pay | Admitting: Family Medicine

## 2019-09-11 DIAGNOSIS — J329 Chronic sinusitis, unspecified: Secondary | ICD-10-CM | POA: Diagnosis not present

## 2019-09-24 ENCOUNTER — Other Ambulatory Visit: Payer: Self-pay | Admitting: Otolaryngology

## 2019-09-24 ENCOUNTER — Encounter: Payer: Self-pay | Admitting: Family Medicine

## 2019-09-24 DIAGNOSIS — J0101 Acute recurrent maxillary sinusitis: Secondary | ICD-10-CM | POA: Diagnosis not present

## 2019-09-24 DIAGNOSIS — J0141 Acute recurrent pansinusitis: Secondary | ICD-10-CM

## 2019-09-24 DIAGNOSIS — J342 Deviated nasal septum: Secondary | ICD-10-CM | POA: Diagnosis not present

## 2019-09-26 MED ORDER — CHANTIX STARTING MONTH PAK 0.5 MG X 11 & 1 MG X 42 PO TABS: ORAL_TABLET | ORAL | 0 refills | Status: DC

## 2019-09-26 MED ORDER — VARENICLINE TARTRATE 1 MG PO TABS: 1.0000 mg | ORAL_TABLET | Freq: Two times a day (BID) | ORAL | 4 refills | Status: DC

## 2019-10-04 ENCOUNTER — Other Ambulatory Visit: Payer: Self-pay | Admitting: Family Medicine

## 2019-10-04 NOTE — Telephone Encounter (Signed)
Last office visit 08/18/2019 for Sinus Pain.   Last refilled Adderall 08/16/2019 for #60 with no refills.  No future appointments with PCP.

## 2019-10-08 ENCOUNTER — Other Ambulatory Visit: Payer: Self-pay

## 2019-10-08 ENCOUNTER — Ambulatory Visit
Admission: RE | Admit: 2019-10-08 | Discharge: 2019-10-08 | Disposition: A | Discharge status: Home / Self Care | Payer: BC Managed Care – PPO | Source: Ambulatory Visit | Attending: Otolaryngology | Admitting: Otolaryngology

## 2019-10-08 DIAGNOSIS — J342 Deviated nasal septum: Secondary | ICD-10-CM | POA: Diagnosis not present

## 2019-10-08 DIAGNOSIS — J0141 Acute recurrent pansinusitis: Secondary | ICD-10-CM | POA: Diagnosis not present

## 2019-10-08 DIAGNOSIS — J324 Chronic pansinusitis: Secondary | ICD-10-CM | POA: Diagnosis not present

## 2019-10-08 DIAGNOSIS — J3489 Other specified disorders of nose and nasal sinuses: Secondary | ICD-10-CM | POA: Diagnosis not present

## 2019-10-18 ENCOUNTER — Telehealth: Payer: Self-pay | Admitting: Family Medicine

## 2019-10-18 NOTE — Telephone Encounter (Signed)
Please schedule CPE with fasting labs prior with Dr. Copland.  

## 2019-11-04 DIAGNOSIS — J342 Deviated nasal septum: Secondary | ICD-10-CM | POA: Diagnosis not present

## 2019-11-04 NOTE — Telephone Encounter (Signed)
Spoke to spouse she will talk to pt and call back to schedule

## 2019-11-05 ENCOUNTER — Other Ambulatory Visit: Payer: Self-pay | Admitting: Family Medicine

## 2019-11-05 NOTE — Telephone Encounter (Signed)
Spouse called to schedule cpx 10/18 pt needs refill on addreall  Pt has 1 week left  [facility]

## 2019-11-05 NOTE — Telephone Encounter (Signed)
Last office visit 08/18/2019 for Sinus Pain. Last refilled 10/04/2019 for #60 with no refills.

## 2019-11-07 MED ORDER — AMPHETAMINE-DEXTROAMPHETAMINE 15 MG PO TABS: 1.0000 | ORAL_TABLET | Freq: Two times a day (BID) | ORAL | 0 refills | Status: DC

## 2019-12-06 ENCOUNTER — Other Ambulatory Visit: Payer: Self-pay

## 2019-12-06 ENCOUNTER — Other Ambulatory Visit (INDEPENDENT_AMBULATORY_CARE_PROVIDER_SITE_OTHER): Payer: BC Managed Care – PPO

## 2019-12-06 DIAGNOSIS — Z125 Encounter for screening for malignant neoplasm of prostate: Secondary | ICD-10-CM

## 2019-12-06 DIAGNOSIS — Z13220 Encounter for screening for lipoid disorders: Secondary | ICD-10-CM

## 2019-12-06 DIAGNOSIS — Z131 Encounter for screening for diabetes mellitus: Secondary | ICD-10-CM

## 2019-12-06 DIAGNOSIS — Z79899 Other long term (current) drug therapy: Secondary | ICD-10-CM | POA: Diagnosis not present

## 2019-12-06 LAB — LIPID PANEL
Cholesterol: 228 mg/dL — ABNORMAL HIGH (ref 0–200)
HDL: 41.2 mg/dL (ref >39.00)
NonHDL: 186.78
Total CHOL/HDL Ratio: 6
Triglycerides: 221 mg/dL — ABNORMAL HIGH (ref 0.0–149.0)
VLDL: 44.2 mg/dL — ABNORMAL HIGH (ref 0.0–40.0)

## 2019-12-06 LAB — HEPATIC FUNCTION PANEL
ALT: 38 U/L (ref 0–53)
AST: 21 U/L (ref 0–37)
Albumin: 4.9 g/dL (ref 3.5–5.2)
Alkaline Phosphatase: 72 U/L (ref 39–117)
Bilirubin, Direct: 0.1 mg/dL (ref 0.0–0.3)
Total Bilirubin: 0.4 mg/dL (ref 0.2–1.2)
Total Protein: 7.4 g/dL (ref 6.0–8.3)

## 2019-12-06 LAB — BASIC METABOLIC PANEL
BUN: 15 mg/dL (ref 6–23)
CO2: 28 mEq/L (ref 19–32)
Calcium: 9.8 mg/dL (ref 8.4–10.5)
Chloride: 103 mEq/L (ref 96–112)
Creatinine, Ser: 1.08 mg/dL (ref 0.40–1.50)
GFR: 84.13 mL/min (ref >60.00)
Glucose, Bld: 97 mg/dL (ref 70–99)
Potassium: 5.4 mEq/L — ABNORMAL HIGH (ref 3.5–5.1)
Sodium: 138 mEq/L (ref 135–145)

## 2019-12-06 LAB — CBC WITH DIFFERENTIAL/PLATELET
Basophils Absolute: 0 10*3/uL (ref 0.0–0.1)
Basophils Relative: 0.5 % (ref 0.0–3.0)
Eosinophils Absolute: 0.2 10*3/uL (ref 0.0–0.7)
Eosinophils Relative: 1.5 % (ref 0.0–5.0)
HCT: 49.8 % (ref 39.0–52.0)
Hemoglobin: 17.1 g/dL — ABNORMAL HIGH (ref 13.0–17.0)
Lymphocytes Relative: 33.1 % (ref 12.0–46.0)
Lymphs Abs: 3.3 10*3/uL (ref 0.7–4.0)
MCHC: 34.3 g/dL (ref 30.0–36.0)
MCV: 92.7 fl (ref 78.0–100.0)
Monocytes Absolute: 0.6 10*3/uL (ref 0.1–1.0)
Monocytes Relative: 5.5 % (ref 3.0–12.0)
Neutro Abs: 6 10*3/uL (ref 1.4–7.7)
Neutrophils Relative %: 59.4 % (ref 43.0–77.0)
Platelets: 275 10*3/uL (ref 150.0–400.0)
RBC: 5.37 Mil/uL (ref 4.22–5.81)
RDW: 13.3 % (ref 11.5–15.5)
WBC: 10 10*3/uL (ref 4.0–10.5)

## 2019-12-06 LAB — HEMOGLOBIN A1C: Hgb A1c MFr Bld: 5.3 % (ref 4.6–6.5)

## 2019-12-06 LAB — LDL CHOLESTEROL, DIRECT: Direct LDL: 155 mg/dL

## 2019-12-08 LAB — PSA, TOTAL WITH REFLEX TO PSA, FREE: PSA, Total: 0.2 ng/mL (ref <=4.0)

## 2019-12-08 LAB — DRUG MONITORING, PANEL 8 WITH CONFIRMATION, URINE
6 Acetylmorphine: NEGATIVE ng/mL (ref <10)
Alcohol Metabolites: NEGATIVE ng/mL
Amphetamine: 6001 ng/mL — ABNORMAL HIGH (ref <250)
Amphetamines: POSITIVE ng/mL — AB (ref <500)
Benzodiazepines: NEGATIVE ng/mL (ref <100)
Buprenorphine, Urine: NEGATIVE ng/mL (ref <5)
Cocaine Metabolite: NEGATIVE ng/mL (ref <150)
Creatinine: 225.4 mg/dL
Ethyl Glucuronide (ETG): NEGATIVE ng/mL (ref <500)
Ethyl Sulfate (ETS): NEGATIVE ng/mL (ref <100)
MDMA: NEGATIVE ng/mL (ref <500)
Marijuana Metabolite: 73 ng/mL — ABNORMAL HIGH (ref <5)
Marijuana Metabolite: POSITIVE ng/mL — AB (ref <20)
Methamphetamine: NEGATIVE ng/mL (ref <250)
Opiates: NEGATIVE ng/mL (ref <100)
Oxidant: NEGATIVE ug/mL
Oxycodone: NEGATIVE ng/mL (ref <100)
pH: 5.4 (ref 4.5–9.0)

## 2019-12-08 LAB — DM TEMPLATE

## 2019-12-09 ENCOUNTER — Ambulatory Visit (INDEPENDENT_AMBULATORY_CARE_PROVIDER_SITE_OTHER): Payer: BC Managed Care – PPO | Admitting: Family Medicine

## 2019-12-09 ENCOUNTER — Other Ambulatory Visit: Payer: Self-pay

## 2019-12-09 ENCOUNTER — Encounter: Payer: Self-pay | Admitting: Family Medicine

## 2019-12-09 ENCOUNTER — Other Ambulatory Visit: Payer: Self-pay | Admitting: Family Medicine

## 2019-12-09 VITALS — BP 126/88 | HR 90 | Temp 98.0°F | Ht 67.75 in | Wt 179.0 lb

## 2019-12-09 DIAGNOSIS — Z23 Encounter for immunization: Secondary | ICD-10-CM

## 2019-12-09 DIAGNOSIS — F900 Attention-deficit hyperactivity disorder, predominantly inattentive type: Secondary | ICD-10-CM

## 2019-12-09 DIAGNOSIS — Z0000 Encounter for general adult medical examination without abnormal findings: Secondary | ICD-10-CM | POA: Diagnosis not present

## 2019-12-09 MED ORDER — PREDNISONE 20 MG PO TABS: ORAL_TABLET | ORAL | 0 refills | Status: DC

## 2019-12-09 MED ORDER — ARIPIPRAZOLE 10 MG PO TABS
10.0000 mg | ORAL_TABLET | Freq: Every day | ORAL | 3 refills | Status: DC
Start: 2019-12-09 — End: 2019-12-09

## 2019-12-09 MED ORDER — VITAMIN D3 25 MCG (1000 UNIT) PO TABS
1000.0000 [IU] | ORAL_TABLET | Freq: Every day | ORAL | 3 refills | Status: DC
Start: 2019-12-09 — End: 2020-12-13

## 2019-12-09 MED ORDER — AMPHETAMINE-DEXTROAMPHETAMINE 15 MG PO TABS: 1.0000 | ORAL_TABLET | Freq: Two times a day (BID) | ORAL | 0 refills | Status: DC

## 2019-12-09 NOTE — Progress Notes (Signed)
Raymond [name] T. Raymond Resor, MD, [place]  Primary Care and [place] at [facility] [place], [address]  Phone: [phone]  FAX: [phone]  Raymond [name] - 44 y.o. male  MRN [id number]  Date of Birth: 1976-01-07  Date: 12/09/2019  PCP: [name], MD  Referral: [name], MD  Chief Complaint  Patient presents with  . Annual Exam    This visit occurred during the SARS-CoV-2 public health emergency.  Safety protocols were in place, including screening questions prior to the visit, additional usage of staff PPE, and extensive cleaning of exam room while observing appropriate contact time as indicated for disinfecting solutions.   Patient Care Team: [name], MD as PCP - General (Family Medicine) Subjective:   Raymond [name] is a 44 y.o. pleasant patient who presents with the following:  Preventative Health Maintenance Visit:  Health Maintenance Summary Reviewed and updated, unless pt declines services.  Tobacco History Reviewed.  Smoking. Alcohol: No concerns, he is completely sober now Exercise Habits: Some activity, rec at least 30 mins 5 times a week - doing some back rehab STD concerns: no risk or activity to increase risk Drug Use: None  Check on COVID-19 status - he has had both vaccines  L LE:  L > R, classic LE  Has been taking some tylenol over the counter.   The 10-year ASCVD risk score Raymond [name] DC Raymond [name]., et al., 2013) is: 8.1%   Values used to calculate the score:     Age: 60 years     Sex: Male     Is Non-Hispanic African American: No     Diabetic: No     Tobacco smoker: Yes     Systolic Blood Pressure: 384 mmHg     Is BP treated: Yes     HDL Cholesterol: 41.2 mg/dL     Total Cholesterol: 228 mg/dL    Health Maintenance  Topic Date Due  . Hepatitis C Screening  Never done  . HIV Screening  Never done  . TETANUS/TDAP  08/14/2026  . INFLUENZA VACCINE  Completed    . COVID-19 Vaccine  Completed   Immunization History  Administered Date(s) Administered  . Influenza,inj,Quad PF,6+ Mos 11/18/2018, 12/09/2019  . PFIZER SARS-COV-2 Vaccination 09/11/2019, 10/02/2019  . Tdap 08/13/2016   Patient Active Problem List   Diagnosis Date Noted  . Alcohol use disorder, severe, dependence ([place]) 05/09/2017  . Major depressive disorder, recurrent severe without psychotic features ([place]) 05/08/2017  . Generalized anxiety disorder 05/31/2016  . 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 HTN (hypertension) 04/06/2013  . Allergic rhinitis 01/18/2013  . Family history of long QT syndrome 06/12/2012  . 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 Smoker 06/12/2012  . Common migraine 05/26/2012  . Fibromyalgia 05/26/2012    Past Medical History:  Diagnosis Date  . Alcohol use disorder, severe, dependence ([place]) 05/09/2017  . 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 Generalized anxiety disorder 05/31/2016  . History of gastric ulcer    summer 2015  . Hypertension    has not been taking his medication; advised to start back on med. today  . Major depressive disorder, recurrent severe without psychotic features ([place]) 05/08/2017  . Osteoarthritis of shoulder 01/2014   right    Past Surgical History:  Procedure Laterality Date  . CHOLECYSTECTOMY    . COLONOSCOPY WITH PROPOFOL N/A 05/14/2016   Procedure: COLONOSCOPY WITH PROPOFOL;  Surgeon: Lucilla Lame, MD;  Location[facility] ENDOSCOPY;  Service: Endoscopy;  Laterality: N/A;  . ESOPHAGOGASTRODUODENOSCOPY (EGD) WITH PROPOFOL N/A 05/14/2016   Procedure: ESOPHAGOGASTRODUODENOSCOPY (EGD) WITH PROPOFOL;  Surgeon: Lucilla Lame, MD;  Location: [facility] ENDOSCOPY;  Service: Endoscopy;  Laterality: N/A;  . RESECTION DISTAL CLAVICAL Right 02/21/2014   Procedure: RESECTION DISTAL CLAVICAL;  Surgeon: Nita Sells, MD;  Location: [place];  Service: Orthopedics;  Laterality: Right;  . SHOULDER ARTHROSCOPY WITH SUBACROMIAL DECOMPRESSION Right 02/21/2014   Procedure: SHOULDER ARTHROSCOPY WITH SUBACROMIAL DECOMPRESSION, DISTAL CLAVICAL EXCISION, DEBRIDIMENT OF PARTIAL ROTATOR CUFF TEAR;  Surgeon: Nita Sells, MD;  Location: [place];  Service: Orthopedics;  Laterality: Right;  Right shoulder arthroscopy subacromial decompression, distal clavical excision    Family History  Problem Relation Age of Onset  . Diabetes Mother   . Heart disease Mother   . Long QT syndrome Mother   . Lung disease Father   . Diabetes Father     Past Medical History, Surgical History, Social History, Family History, Problem List, Medications, and Allergies have been reviewed and updated if relevant.  Review of Systems: Pertinent positives are listed above.  Otherwise, a full 14 point review of systems has been done in full and it is negative except where it is noted positive.  Objective:   BP 126/88   Pulse 90   Temp 98 F (36.7 C) (Temporal)   Ht 5' 7.75" (1.721 m)   Wt 179 lb (81.2 kg)   SpO2 97%   BMI 27.42 kg/m  Ideal Body Weight: Weight in (lb) to have BMI = 25: 162.9  Ideal Body Weight: Weight in (lb) to have BMI = 25: 162.9 No exam data present Depression screen [facility] 2/9 08/23/2016  Decreased Interest 0  Down, Depressed, Hopeless 0  PHQ - 2 Score 0     GEN: well developed, well nourished, no acute distress Eyes: conjunctiva and lids normal, PERRLA, EOMI ENT: TM clear, nares clear, oral exam WNL Neck: supple, no lymphadenopathy, no thyromegaly, no JVD Pulm: clear to auscultation and percussion, respiratory effort normal CV: regular rate and rhythm, S1-S2, no murmur, rub or gallop, no bruits, peripheral pulses normal and symmetric, no cyanosis, clubbing, edema or varicosities GI: soft, non-tender; no hepatosplenomegaly, masses; active bowel sounds all quadrants GU: no hernia,  testicular mass, penile discharge Lymph: no cervical, axillary or inguinal adenopathy MSK: gait normal, muscle tone and strength WNL, no joint swelling, effusions, discoloration, crepitus  SKIN: clear, good turgor, color WNL, no rashes, lesions, or ulcerations Neuro: normal mental status, normal strength, sensation, and motion Psych: alert; oriented to person, place and time, normally interactive and not anxious or depressed in appearance.  All labs reviewed with patient. Results for orders placed or performed in visit on 12/06/19  DRUG MONITORING, PANEL 8 WITH CONFIRMATION, URINE  Result Value Ref Range   Alcohol Metabolites NEGATIVE CONFIRMED ng/mL   Ethyl Glucuronide (ETG) NEGATIVE <500 ng/mL   Ethyl Sulfate (ETS) NEGATIVE <100 ng/mL   Alcohol Metab Comments     Amphetamines POSITIVE (A) <500 ng/mL   Amphetamine 6,001 (H) <250 ng/mL   Methamphetamine NEGATIVE <250 ng/mL   Amphetamines Comments     Benzodiazepines NEGATIVE <100 ng/mL   Buprenorphine, Urine NEGATIVE <5 ng/mL   Cocaine Metabolite NEGATIVE <150 ng/mL   6 Acetylmorphine NEGATIVE <10 ng/mL   Marijuana Metabolite POSITIVE (A) <20 ng/mL   Marijuana Metabolite 73 (H) <5 ng/mL   Marijuana Comments     MDMA  NEGATIVE <500 ng/mL   Opiates NEGATIVE <100 ng/mL   Oxycodone NEGATIVE <100 ng/mL   Creatinine 225.4 mg/dL   pH 5.4 4.5 - 9.0   Oxidant NEGATIVE mcg/mL  PSA, Total with Reflex to PSA, Free  Result Value Ref Range   PSA, Total 0.2 < OR = 4.0 ng/mL  Hemoglobin A1c  Result Value Ref Range   Hgb A1c MFr Bld 5.3 4.6 - 6.5 %  Basic metabolic panel  Result Value Ref Range   Sodium 138 135 - 145 mEq/L   Potassium 5.4 (H) 3.5 - 5.1 mEq/L   Chloride 103 96 - 112 mEq/L   CO2 28 19 - 32 mEq/L   Glucose, Bld 97 70 - 99 mg/dL   BUN 15 6 - 23 mg/dL   Creatinine, Ser 1.08 0.40 - 1.50 mg/dL   GFR 84.13 >60.00 mL/min   Calcium 9.8 8.4 - 10.5 mg/dL  Hepatic function panel  Result Value Ref Range   Total Bilirubin 0.4 0.2 -  1.2 mg/dL   Bilirubin, Direct 0.1 0.0 - 0.3 mg/dL   Alkaline Phosphatase 72 39 - 117 U/L   AST 21 0 - 37 U/L   ALT 38 0 - 53 U/L   Total Protein 7.4 6.0 - 8.3 g/dL   Albumin 4.9 3.5 - 5.2 g/dL  CBC with Differential/Platelet  Result Value Ref Range   WBC 10.0 4.0 - 10.5 K/uL   RBC 5.37 4.22 - 5.81 Mil/uL   Hemoglobin 17.1 (H) 13.0 - 17.0 g/dL   HCT 49.8 39 - 52 %   MCV 92.7 78.0 - 100.0 fl   MCHC 34.3 30.0 - 36.0 g/dL   RDW 13.3 11.5 - 15.5 %   Platelets 275.0 150 - 400 K/uL   Neutrophils Relative % 59.4 43 - 77 %   Lymphocytes Relative 33.1 12 - 46 %   Monocytes Relative 5.5 3 - 12 %   Eosinophils Relative 1.5 0 - 5 %   Basophils Relative 0.5 0 - 3 %   Neutro Abs 6.0 1.4 - 7.7 K/uL   Lymphs Abs 3.3 0.7 - 4.0 K/uL   Monocytes Absolute 0.6 0.1 - 1.0 K/uL   Eosinophils Absolute 0.2 0.0 - 0.7 K/uL   Basophils Absolute 0.0 0.0 - 0.1 K/uL  Lipid panel  Result Value Ref Range   Cholesterol 228 (H) 0 - 200 mg/dL   Triglycerides 221.0 (H) 0 - 149 mg/dL   HDL 41.20 >39.00 mg/dL   VLDL 44.2 (H) 0.0 - 40.0 mg/dL   Total CHOL/HDL Ratio 6    NonHDL 186.78   LDL cholesterol, direct  Result Value Ref Range   Direct LDL 155.0 mg/dL  DM TEMPLATE  Result Value Ref Range   Notes and Comments      Assessment and Plan:     ICD-10-CM   1. Healthcare maintenance  Z00.00   2. Need for influenza vaccination  Z23 Flu Vaccine QUAD 6+ mos PF IM (Fluarix Quad PF)  3. Attention deficit hyperactivity disorder (ADHD), predominantly inattentive type  F90.0 amphetamine-dextroamphetamine (ADDERALL) 15 MG tablet   Basic rom, LE, burst steroids  ADHD - continue Adderall  Increased lipids, he thinks he can lose weight and start running again.  Some increased anxiety.  lexapro at max.  Increase abilify to 10 mg.  F/u prn or 1 year  Health Maintenance Exam: The patient's preventative maintenance and recommended screening tests for an annual wellness exam were reviewed in full today. Brought up  to date  unless services declined.  Counselled on the importance of diet, exercise, and its role in overall health and mortality. The patient's FH and SH was reviewed, including their home life, tobacco status, and drug and alcohol status.  Follow-up in 1 year for physical exam or additional follow-up below.  Follow-up: No follow-ups on file. Or follow-up in 1 year if not noted.  Meds ordered this encounter  Medications  . amphetamine-dextroamphetamine (ADDERALL) 15 MG tablet    Sig: Take 1 tablet by mouth 2 (two) times daily.    Dispense:  60 tablet    Refill:  0  . cholecalciferol (VITAMIN D) 25 MCG (1000 UNIT) tablet    Sig: Take 1 tablet (1,000 Units total) by mouth daily. For bone health    Dispense:  90 tablet    Refill:  3  . ARIPiprazole (ABILIFY) 10 MG tablet    Sig: Take 1 tablet (10 mg total) by mouth daily.    Dispense:  90 tablet    Refill:  3  . predniSONE (DELTASONE) 20 MG tablet    Sig: 2 tabs po daily for 5 days, then 1 tab po daily for 5 days    Dispense:  15 tablet    Refill:  0   Medications Discontinued During This Encounter  Medication Reason  . varenicline (CHANTIX CONTINUING MONTH PAK) 1 MG tablet Completed Course  . varenicline (CHANTIX STARTING MONTH PAK) 0.5 MG X 11 & 1 MG X 42 tablet Completed Course  . cholecalciferol (VITAMIN D) 1000 units tablet Reorder  . ARIPiprazole (ABILIFY) 5 MG tablet   . amphetamine-dextroamphetamine (ADDERALL) 15 MG tablet Reorder   Orders Placed This Encounter  Procedures  . Flu Vaccine QUAD 6+ mos PF IM (Fluarix Quad PF)    Signed,  Sharanda Shinault T. Alicia Ackert, MD   Allergies as of 12/09/2019      Reactions   Azithromycin Nausea And Vomiting   Percocet [oxycodone-acetaminophen] Hives   Vicodin [hydrocodone-acetaminophen] Hives   Adhesive [tape] Other (See Comments)   SKIN IRRITATION      Medication List       Accurate as of December 09, 2019  9:44 AM. If you have any questions, ask your nurse or doctor.         STOP taking these medications   Chantix Starting Month Pak 0.5 MG X 11 & 1 MG X 42 tablet Generic drug: varenicline Stopped by: [name], MD   varenicline 1 MG tablet Commonly known as: Chantix Continuing Month Pak Stopped by: [name], MD     TAKE these medications   amphetamine-dextroamphetamine 15 MG tablet Commonly known as: ADDERALL Take 1 tablet by mouth 2 (two) times daily.   ARIPiprazole 10 MG tablet Commonly known as: ABILIFY Take 1 tablet (10 mg total) by mouth daily. What changed:   medication strength  See the new instructions. Changed by: [name], MD   azelastine 0.1 % nasal spray Commonly known as: ASTELIN PLACE 1 SPRAY INTO BOTH NOSTRILS 2 (TWO) TIMES DAILY   cholecalciferol 25 MCG (1000 UNIT) tablet Commonly known as: VITAMIN D Take 1 tablet (1,000 Units total) by mouth daily. For bone health   escitalopram 20 MG tablet Commonly known as: LEXAPRO TAKE 1 TABLET (20 MG TOTAL) BY MOUTH DAILY.   etodolac 500 MG tablet Commonly known as: LODINE TAKE 1 TABLET BY MOUTH 2 TIMES DAILY.   hydrochlorothiazide 25 MG tablet Commonly known as: HYDRODIURIL TAKE 1 TABLET BY MOUTH DAILY FOR HIGH BLOOD PRESSURE  hydrOXYzine 50 MG tablet Commonly known as: ATARAX/VISTARIL TAKE 1 TABLET BY MOUTH EVERY 6 HOURS AS NEEDED FOR ANXIETY.   potassium chloride SA 20 MEQ tablet Commonly known as: KLOR-CON Take 1 tablet (20 mEq total) by mouth daily. For low potassium   predniSONE 20 MG tablet Commonly known as: DELTASONE 2 tabs po daily for 5 days, then 1 tab po daily for 5 days Started by: [name], MD   sildenafil 100 MG tablet Commonly known as: Viagra Take 0.5-1 tablets (50-100 mg total) by mouth daily as needed for erectile dysfunction.   traZODone 150 MG tablet Commonly known as: DESYREL TAKE 1 TO 2 TABLETS BY MOUTH AT BEDTIME FOR SLEEP

## 2019-12-23 ENCOUNTER — Encounter: Payer: Self-pay | Admitting: Family Medicine

## 2019-12-23 ENCOUNTER — Other Ambulatory Visit: Payer: Self-pay

## 2019-12-23 ENCOUNTER — Encounter: Payer: Self-pay | Admitting: Otolaryngology

## 2019-12-23 ENCOUNTER — Other Ambulatory Visit: Payer: Self-pay | Admitting: Family Medicine

## 2019-12-23 MED ORDER — PREDNISONE 20 MG PO TABS: ORAL_TABLET | ORAL | 0 refills | Status: DC

## 2019-12-27 ENCOUNTER — Other Ambulatory Visit: Payer: Self-pay

## 2019-12-27 ENCOUNTER — Other Ambulatory Visit
Admission: RE | Admit: 2019-12-27 | Discharge: 2019-12-27 | Disposition: A | Discharge status: Home / Self Care | Payer: BC Managed Care – PPO | Source: Ambulatory Visit | Attending: Otolaryngology | Admitting: Otolaryngology

## 2019-12-27 DIAGNOSIS — J343 Hypertrophy of nasal turbinates: Secondary | ICD-10-CM | POA: Diagnosis not present

## 2019-12-27 DIAGNOSIS — Z20822 Contact with and (suspected) exposure to covid-19: Secondary | ICD-10-CM | POA: Insufficient documentation

## 2019-12-27 DIAGNOSIS — Z01818 Encounter for other preprocedural examination: Secondary | ICD-10-CM | POA: Insufficient documentation

## 2019-12-27 DIAGNOSIS — H524 Presbyopia: Secondary | ICD-10-CM | POA: Diagnosis not present

## 2019-12-27 DIAGNOSIS — J342 Deviated nasal septum: Secondary | ICD-10-CM | POA: Diagnosis not present

## 2019-12-27 LAB — SARS CORONAVIRUS 2 (TAT 6-24 HRS): SARS Coronavirus 2: NEGATIVE

## 2019-12-29 ENCOUNTER — Other Ambulatory Visit: Payer: Self-pay | Admitting: Otolaryngology

## 2019-12-29 ENCOUNTER — Ambulatory Visit
Admission: RE | Admit: 2019-12-29 | Discharge: 2019-12-29 | Disposition: A | Discharge status: Home / Self Care | Payer: BC Managed Care – PPO | Attending: Otolaryngology | Admitting: Otolaryngology

## 2019-12-29 ENCOUNTER — Encounter
Admission: RE | Disposition: A | Discharge status: Home / Self Care | Payer: Self-pay | Source: Home / Self Care | Attending: Otolaryngology

## 2019-12-29 ENCOUNTER — Ambulatory Visit: Payer: BC Managed Care – PPO | Admitting: Anesthesiology

## 2019-12-29 ENCOUNTER — Encounter: Payer: Self-pay | Admitting: Otolaryngology

## 2019-12-29 ENCOUNTER — Other Ambulatory Visit: Payer: Self-pay

## 2019-12-29 DIAGNOSIS — J343 Hypertrophy of nasal turbinates: Secondary | ICD-10-CM | POA: Insufficient documentation

## 2019-12-29 DIAGNOSIS — J342 Deviated nasal septum: Secondary | ICD-10-CM | POA: Diagnosis not present

## 2019-12-29 DIAGNOSIS — J3489 Other specified disorders of nose and nasal sinuses: Secondary | ICD-10-CM | POA: Diagnosis not present

## 2019-12-29 DIAGNOSIS — Z20822 Contact with and (suspected) exposure to covid-19: Secondary | ICD-10-CM | POA: Insufficient documentation

## 2019-12-29 HISTORY — DX: Deviated nasal septum: J34.2

## 2019-12-29 HISTORY — PX: ENDOSCOPIC CONCHA BULLOSA RESECTION: SHX6395

## 2019-12-29 HISTORY — DX: Hypertrophy of nasal turbinates: J34.3

## 2019-12-29 HISTORY — PX: NASAL SEPTOPLASTY W/ TURBINOPLASTY: SHX2070

## 2019-12-29 HISTORY — DX: Headache, unspecified: R51.9

## 2019-12-29 HISTORY — DX: Gastro-esophageal reflux disease without esophagitis: K21.9

## 2019-12-29 SURGERY — SEPTOPLASTY, NOSE, WITH NASAL TURBINATE REDUCTION
Anesthesia: General | Site: Nose | Laterality: Left

## 2019-12-29 MED ORDER — LIDOCAINE-EPINEPHRINE 1 %-1:100000 IJ SOLN
INTRAMUSCULAR | Status: DC | PRN
  Administered 2019-12-29: 10 mL

## 2019-12-29 MED ORDER — FENTANYL CITRATE (PF) 100 MCG/2ML IJ SOLN
INTRAMUSCULAR | Status: DC | PRN
  Administered 2019-12-29: 100 ug via INTRAVENOUS
  Administered 2019-12-29: 50 ug via INTRAVENOUS

## 2019-12-29 MED ORDER — OXYCODONE HCL 5 MG/5ML PO SOLN
5.0000 mg | Freq: Once | ORAL | Status: AC | PRN
  Administered 2019-12-29: 5 mg via ORAL

## 2019-12-29 MED ORDER — MIDAZOLAM HCL 5 MG/5ML IJ SOLN
INTRAMUSCULAR | Status: DC | PRN
  Administered 2019-12-29: 2 mg via INTRAVENOUS

## 2019-12-29 MED ORDER — SUCCINYLCHOLINE CHLORIDE 20 MG/ML IJ SOLN
INTRAMUSCULAR | Status: DC | PRN
  Administered 2019-12-29: 100 mg via INTRAVENOUS

## 2019-12-29 MED ORDER — ONDANSETRON HCL 4 MG/2ML IJ SOLN
INTRAMUSCULAR | Status: DC | PRN
  Administered 2019-12-29: 4 mg via INTRAVENOUS

## 2019-12-29 MED ORDER — BACITRACIN 500 UNIT/GM EX OINT
TOPICAL_OINTMENT | CUTANEOUS | Status: DC | PRN
  Administered 2019-12-29: 1 via TOPICAL

## 2019-12-29 MED ORDER — 0.9 % SODIUM CHLORIDE (POUR BTL) OPTIME
TOPICAL | Status: DC | PRN
  Administered 2019-12-29: 250 mL

## 2019-12-29 MED ORDER — DEXAMETHASONE SODIUM PHOSPHATE 4 MG/ML IJ SOLN
INTRAMUSCULAR | Status: DC | PRN
  Administered 2019-12-29: 4 mg via INTRAVENOUS

## 2019-12-29 MED ORDER — DEXMEDETOMIDINE HCL 200 MCG/2ML IV SOLN
INTRAVENOUS | Status: DC | PRN
  Administered 2019-12-29: 5 ug via INTRAVENOUS
  Administered 2019-12-29: 10 ug via INTRAVENOUS

## 2019-12-29 MED ORDER — LACTATED RINGERS IV SOLN: INTRAVENOUS | Status: DC

## 2019-12-29 MED ORDER — LIDOCAINE HCL (CARDIAC) PF 100 MG/5ML IV SOSY
PREFILLED_SYRINGE | INTRAVENOUS | Status: DC | PRN
  Administered 2019-12-29: 50 mg via INTRAVENOUS

## 2019-12-29 MED ORDER — OXYMETAZOLINE HCL 0.05 % NA SOLN
NASAL | Status: DC | PRN
  Administered 2019-12-29: 1

## 2019-12-29 MED ORDER — GLYCOPYRROLATE 0.2 MG/ML IJ SOLN
INTRAMUSCULAR | Status: DC | PRN
  Administered 2019-12-29: .1 mg via INTRAVENOUS

## 2019-12-29 MED ORDER — PROPOFOL 10 MG/ML IV BOLUS
INTRAVENOUS | Status: DC | PRN
  Administered 2019-12-29: 200 mg via INTRAVENOUS

## 2019-12-29 MED ORDER — HYDROCODONE-ACETAMINOPHEN 7.5-325 MG/15ML PO SOLN
10.0000 mL | Freq: Four times a day (QID) | ORAL | 0 refills | Status: DC | PRN
Start: 2019-12-29 — End: 2019-12-29

## 2019-12-29 MED ORDER — ACETAMINOPHEN 10 MG/ML IV SOLN
1000.0000 mg | Freq: Once | INTRAVENOUS | Status: AC
  Administered 2019-12-29: 1000 mg via INTRAVENOUS

## 2019-12-29 MED ORDER — AMOXICILLIN-POT CLAVULANATE 875-125 MG PO TABS: 1.0000 | ORAL_TABLET | Freq: Two times a day (BID) | ORAL | 0 refills | Status: DC

## 2019-12-29 MED ORDER — ONDANSETRON HCL 4 MG PO TABS: 4.0000 mg | ORAL_TABLET | Freq: Three times a day (TID) | ORAL | 0 refills | Status: DC | PRN

## 2019-12-29 MED ORDER — STERILE WATER FOR INJECTION IJ SOLN
INTRAMUSCULAR | Status: DC | PRN
  Administered 2019-12-29: 250 mL

## 2019-12-29 SURGICAL SUPPLY — 26 items
CANISTER SUCT 1200ML W/VALVE (MISCELLANEOUS) ×3 IMPLANT
COAG SUCT 10F 3.5MM HAND CTRL (MISCELLANEOUS) ×3 IMPLANT
DRESSING NASL FOAM PST OP SINU (MISCELLANEOUS) IMPLANT
DRSG NASAL FOAM POST OP SINU (MISCELLANEOUS)
ELECT REM PT RETURN 9FT ADLT (ELECTROSURGICAL) ×3
ELECTRODE REM PT RTRN 9FT ADLT (ELECTROSURGICAL) ×2 IMPLANT
GLOVE BIO SURGEON STRL SZ7.5 (GLOVE) ×6 IMPLANT
KIT TURNOVER KIT A (KITS) ×3 IMPLANT
NDL HYPO 25GX1X1/2 BEV (NEEDLE) ×2 IMPLANT
NEEDLE HYPO 25GX1X1/2 BEV (NEEDLE) ×3 IMPLANT
PACK ENT CUSTOM (PACKS) ×3 IMPLANT
PATTIES SURGICAL .5 X3 (DISPOSABLE) ×3 IMPLANT
SOL ANTI-FOG 6CC FOG-OUT (MISCELLANEOUS) ×2 IMPLANT
SOL FOG-OUT ANTI-FOG 6CC (MISCELLANEOUS) ×1
SPLINT NASAL SEPTAL BLV .50 ST (MISCELLANEOUS) ×3 IMPLANT
STRAP BODY AND KNEE 60X3 (MISCELLANEOUS) ×3 IMPLANT
SUT CHROMIC 4 0 RB 1X27 (SUTURE) ×3 IMPLANT
SUT ETHILON 3-0 FS-10 30 BLK (SUTURE) ×3
SUT ETHILON 4-0 (SUTURE) ×3
SUT ETHILON 4-0 FS2 18XMFL BLK (SUTURE) ×2
SUT PLAIN GUT 4-0 (SUTURE) ×3 IMPLANT
SUTURE EHLN 3-0 FS-10 30 BLK (SUTURE) ×2 IMPLANT
SUTURE ETHLN 4-0 FS2 18XMF BLK (SUTURE) IMPLANT
SYR 10ML LL (SYRINGE) ×3 IMPLANT
TOWEL OR 17X26 4PK STRL BLUE (TOWEL DISPOSABLE) ×3 IMPLANT
WATER STERILE IRR 250ML POUR (IV SOLUTION) ×1 IMPLANT

## 2019-12-29 NOTE — Anesthesia Procedure Notes (Signed)
Procedure Name: Intubation Date/Time: 12/29/2019 7:37 AM Performed by: Cameron Ali, CRNA Pre-anesthesia Checklist: Patient identified, Emergency Drugs available, Suction available, Patient being monitored and Timeout performed Patient Re-evaluated:Patient Re-evaluated prior to induction Oxygen Delivery Method: Circle system utilized Preoxygenation: Pre-oxygenation with 100% oxygen Induction Type: IV induction Ventilation: Mask ventilation without difficulty Laryngoscope Size: Mac and 3 Grade View: Grade I Tube type: Oral Rae Tube size: 8.0 mm Number of attempts: 1 Placement Confirmation: ETT inserted through vocal cords under direct vision,  positive ETCO2 and breath sounds checked- equal and bilateral Tube secured with: Tape Dental Injury: Teeth and Oropharynx as per pre-operative assessment

## 2019-12-29 NOTE — Anesthesia Preprocedure Evaluation (Signed)
Anesthesia Evaluation  Patient identified by MRN, date of birth, ID band Patient awake    Reviewed: NPO status   History of Anesthesia Complications Negative for: history of anesthetic complications  Airway Mallampati: II  TM Distance: >3 FB Neck ROM: full   Comment: R TMJ Dental no notable dental hx.    Pulmonary Current Smoker and Patient abstained from smoking.,    Pulmonary exam normal        Cardiovascular hypertension, negative cardio ROS Normal cardiovascular exam     Neuro/Psych  Headaches, PSYCHIATRIC DISORDERS Anxiety Depression adhd    GI/Hepatic GERD  Controlled,(+)     substance abuse (last thc 11/09; last etoh 2 yrs ago.)  alcohol use and marijuana use,   Endo/Other  negative endocrine ROS  Renal/GU negative Renal ROS  negative genitourinary   Musculoskeletal  (+) Arthritis , Fibromyalgia -  Abdominal   Peds  Hematology negative hematology ROS (+)   Anesthesia Other Findings Last prednisone: 1 week ago; takes prn.  Covid; NEG.  Reproductive/Obstetrics                             Anesthesia Physical Anesthesia Plan  ASA: II  Anesthesia Plan: General ETT   Post-op Pain Management:    Induction:   PONV Risk Score and Plan: 2 and Ondansetron and Propofol infusion  Airway Management Planned:   Additional Equipment:   Intra-op Plan:   Post-operative Plan:   Informed Consent: I have reviewed the patients History and Physical, chart, labs and discussed the procedure including the risks, benefits and alternatives for the proposed anesthesia with the patient or authorized representative who has indicated his/her understanding and acceptance.       Plan Discussed with: CRNA  Anesthesia Plan Comments:         Anesthesia Quick Evaluation

## 2019-12-29 NOTE — Op Note (Signed)
..12/29/2019  8:54 AM    Raymond [name]  [id number]    Pre-Op Dx:  Deviated Nasal Septum, Hypertrophic Inferior Turbinates, left sided concha bullosa  Post-op Dx: Same  Proc:   1)  Nasal Septoplasty  2)  Bilateral Partial Reduction Inferior Turbinates   3)  Endoscopic left concha bullosa resection  Surg:  Raymond [facility] Methot  Anes:  GOT  EBL:  50  Comp:  none  Findings: severe bilateral soft tissue and bone inferior turbinate hypertrophy, right sided septal deviation, left concha bullosa with crowding of [facility]  Procedure: With the patient in a comfortable supine position,  general orotracheal anesthesia was induced without difficulty.  The patient received preoperative Afrin spray for topical decongestion and vasoconstriction.  At an appropriate level, the patient was placed in a semi-sitting position.  Nasal vibrissae were trimmed.   1% Xylocaine with 1:100,000 epinephrine, 10 cc's, was infiltrated into the anterior floor of the nose, into the nasal spine region, into the membranous columella, and finally into the submucoperichondrial plane of the septum on both sides.  Several minutes were allowed for this to take effect.  Cottoniod pledgetts soaked in Afrin were placed into both nasal cavities and left while the patient was prepped and draped in the standard fashion.   A proper time-out was performed.   The materials were removed from the nose and observed to be intact and correct in number.  The nose was inspected with a headlight and zero degree endoscope with the findings as described above.  A left Killian incision was sharply executed and carried down to the caudal edge of the quadrangular cartilage with a 15 blade scapel.  A mucoperichondrial flap was elelvated along the quadrangular plate back to the bony-cartilaginous junction using caudal elevator and freer elevator. The mucoperiostium was then elevated along the ethmoid plate and the vomer. An itracartilagenous incision  was made using the freer elevator and a contralateral mucoperichondiral flap was elevated using a freer elevator.  Care was taken to avoid any large rents or opposing rents in the mucoperichondrial flap.  Boney spurs of the vomer and maxillary crest were removed with Takahashi forceps.  The area of cartilagenous deviation was removed with combination of freer elevator and Takahashi forceps creating a widely patent nasal cavity as well as resolution of obstruction from the cartilagenous deviation. The mucosal flaps were placed back into their anatomic position to allow visualization of the airways. The septum now sat in the midline with an improved airway.  A 4-0 Chromic was used to close the [place] incision as well.   The inferior turbinates were then inspected.  Under endoscopic visualization, the inferior turbinates were infractured bilaterally with a Soil scientist.  A kelly clamp was attached to the anterior-inferior third of each inferior turbinate for approximately one minute.  Under endoscopic visualization, Tru-cutting forceps were used to remove the anterior-inferior third of each inferior turbinate.  Electrocautery was used to control bleeding in the area. The remaining turbinate was then outfractured to open up the airway further. There was no significant bleeding noted. The right turbinate was then trimmed and outfractured in a similar fashion.  The airways were then visualized and showed open passageways on both sides that were significantly improved compared to before surgery.       At this point, attention was directed to the endoscopic sinus surgery aspect of the procedure.  The nose was next inspected with a zero degree endoscope and the left middle turbinate was medialized and afrin  soaked pledgets were placed lateral to the turbinates for approximately one minute.  A kelley clamp was placed on the inferior edge of the middle turbinate and then this was resected with Grimwald forceps.  A  freer elevator was used to removed the lateral edge of the inferior turbinate for a reduction in size of the concha bullosa.   Meticulous hemostasis was continued and the nasal cavity was irrigated with sterile saline.    There was no signifcant bleeding. Nasal splints were applied to both sides of the septum using Xomed 0.43mm regular sized splints that were trimmed, and then held in position with a 3-0 Nylon through and through suture.  Stamberger sinufoam was placed along the cut edge of the inferior turbinates bilaterally and the concha bullosa on the left.  The patient was turned back over to anesthesia, and awakened, extubated, and taken to the PACU in satisfactory condition.  Dispo:   PACU to home  Plan: Ice, elevation, narcotic analgesia, steroid taper, and prophylactic antibiotics for the duration of indwelling nasal foreign bodies.  We will reevaluate the patient in the office in 6 days and remove the septal splints.  Return to work in 10 days, strenuous activities in two weeks.   Raymond [name] 12/29/2019 8:54 AM

## 2019-12-29 NOTE — Discharge Instructions (Signed)
[facility] °[facility] °ENDOSCOPIC SINUS SURGERY °[facility] ° °What is Functional Endoscopic Sinus Surgery? ° The Surgery involves making the natural openings of the sinuses larger by removing the bony partitions that separate the sinuses from the nasal cavity.  The natural sinus lining is preserved as much as possible to allow the sinuses to resume normal function after the surgery.  In some patients nasal polyps (excessively swollen lining of the sinuses) may be removed to relieve obstruction of the sinus openings.  The surgery is performed through the nose using lighted scopes, which eliminates the need for incisions on the face.  A septoplasty is a different procedure which is sometimes performed with sinus surgery.  It involves straightening the boy partition that separates the two sides of your nose.  A crooked or deviated septum may need repair if is obstructing the sinuses or nasal airflow.  Turbinate reduction is also often performed during sinus surgery.  The turbinates are bony proturberances from the side walls of the nose which swell and can obstruct the nose in patients with sinus and allergy problems.  Their size can be surgically reduced to help relieve nasal obstruction. ° °What Can Sinus Surgery Do For Me? ° Sinus surgery can reduce the frequency of sinus infections requiring antibiotic treatment.  This can provide improvement in nasal congestion, post-nasal drainage, facial pressure and nasal obstruction.  Surgery will NOT prevent you from ever having an infection again, so it usually only for patients who get infections 4 or more times yearly requiring antibiotics, or for infections that do not clear with antibiotics.  It will not cure nasal allergies, so patients with allergies may still require medication to treat their allergies after surgery. Surgery may improve headaches related to sinusitis, however, some people will continue to  require medication to control sinus headaches related to allergies.  Surgery will do nothing for other forms of headache (migraine, tension or cluster). ° °What Are the Risks of Endoscopic Sinus Surgery? ° Current techniques allow surgery to be performed safely with little risk, however, there are rare complications that patients should be aware of.  Because the sinuses are located around the eyes, there is risk of eye injury, including blindness, though again, this would be quite rare. This is usually a result of bleeding behind the eye during surgery, which puts the vision oat risk, though there are treatments to protect the vision and prevent permanent disrupted by surgery causing a leak of the spinal fluid that surrounds the brain.  More serious complications would include bleeding inside the brain cavity or damage to the brain.  Again, all of these complications are uncommon, and spinal fluid leaks can be safely managed surgically if they occur.  The most common complication of sinus surgery is bleeding from the nose, which may require packing or cauterization of the nose.  Continued sinus have polyps may experience recurrence of the polyps requiring revision surgery.  Alterations of sense of smell or injury to the tear ducts are also rare complications.  ° °What is the Surgery Like, and what is the Recovery? ° The Surgery usually takes a couple of hours to perform, and is usually performed under a general anesthetic (completely asleep).  Patients are usually discharged home after a couple of hours.  Sometimes during surgery it is necessary to pack the nose to control bleeding, and the packing is left in place for 24 - 48 hours, and removed by your surgeon.    If a septoplasty was performed during the procedure, there is often a splint placed which must be removed after 5-7 days.   °Discomfort: Pain is usually mild to moderate, and can be controlled by prescription pain medication or acetaminophen (Tylenol).   Aspirin, Ibuprofen (Advil, Motrin), or Naprosyn (Aleve) should be avoided, as they can cause increased bleeding.  Most patients feel sinus pressure like they have a bad head cold for several days.  Sleeping with your head elevated can help reduce swelling and facial pressure, as can ice packs over the face.  A humidifier may be helpful to keep the mucous and blood from drying in the nose.  ° °Diet: There are no specific diet restrictions, however, you should generally start with clear liquids and a light diet of bland foods because the anesthetic can cause some nausea.  Advance your diet depending on how your stomach feels.  Taking your pain medication with food will often help reduce stomach upset which pain medications can cause. ° °Nasal Saline Irrigation: It is important to remove blood clots and dried mucous from the nose as it is healing.  This is done by having you irrigate the nose at least 3 - 4 times daily with a salt water solution.  We recommend using NeilMed Sinus Rinse (available at the drug store).  Fill the squeeze bottle with the solution, bend over a sink, and insert the tip of the squeeze bottle into the nose ½ of an inch.  Point the tip of the squeeze bottle towards the inside corner of the eye on the same side your irrigating.  Squeeze the bottle and gently irrigate the nose.  If you bend forward as you do this, most of the fluid will flow back out of the nose, instead of down your throat.   The solution should be warm, near body temperature, when you irrigate.   Each time you irrigate, you should use a full squeeze bottle.  ° °Note that if you are instructed to use Nasal Steroid Sprays at any time after your surgery, irrigate with saline BEFORE using the steroid spray, so you do not wash it all out of the nose. °Another product, Nasal Saline Gel (such as AYR Nasal Saline Gel) can be applied in each nostril 3 - 4 times daily to moisture the nose and reduce scabbing or crusting. ° °Bleeding:   Bloody drainage from the nose can be expected for several days, and patients are instructed to irrigate their nose frequently with salt water to help remove mucous and blood clots.  The drainage may be dark red or brown, though some fresh blood may be seen intermittently, especially after irrigation.  Do not blow you nose, as bleeding may occur. If you must sneeze, keep your mouth open to allow air to escape through your mouth. ° °If heavy bleeding occurs: Irrigate the nose with saline to rinse out clots, then spray the nose 3 - 4 times with Afrin Nasal Decongestant Spray.  The spray will constrict the blood vessels to slow bleeding.  Pinch the lower half of your nose shut to apply pressure, and lay down with your head elevated.  Ice packs over the nose may help as well. If bleeding persists despite these measures, you should notify your doctor.  Do not use the Afrin routinely to control nasal congestion after surgery, as it can result in worsening congestion and may affect healing.  ° ° ° °Activity: Return to work varies among patients. Most patients will be   out of work at least 5 - 7 days to recover.  Patient may return to work after they are off of narcotic pain medication, and feeling well enough to perform the functions of their job.  Patients must avoid heavy lifting (over 10 pounds) or strenuous physical for 2 weeks after surgery, so your employer may need to assign you to light duty, or keep you out of work longer if light duty is not possible.  NOTE: you should not drive, operate dangerous machinery, do any mentally demanding tasks or make any important legal or financial decisions while on narcotic pain medication and recovering from the general anesthetic.    Call Your Doctor Immediately if You Have Any of the Following: 1. Bleeding that you cannot control with the above measures 2. Loss of vision, double vision, bulging of the eye or black eyes. 3. Fever over 101 degrees 4. Neck stiffness with  severe headache, fever, nausea and change in mental state. You are always encourage to call anytime with concerns, however, please call with requests for pain medication refills during office hours.  Office Endoscopy: During follow-up visits your doctor will remove any packing or splints that may have been placed and evaluate and clean your sinuses endoscopically.  Topical anesthetic will be used to make this as comfortable as possible, though you may want to take your pain medication prior to the visit.  How often this will need to be done varies from patient to patient.  After complete recovery from the surgery, you may need follow-up endoscopy from time to time, particularly if there is concern of recurrent infection or nasal polyps.

## 2019-12-29 NOTE — Transfer of Care (Signed)
Immediate Anesthesia Transfer of Care Note  Patient: Raymond [name]  Procedure(s) Performed: NASAL SEPTOPLASTY WITH INFERIOR TURBINATE REDUCTION (Bilateral Nose) ENDOSCOPIC CONCHA BULLOSA RESECTION (Left Nose)  Patient Location: PACU  Anesthesia Type: General ETT  Level of Consciousness: awake, alert  and patient cooperative  Airway and Oxygen Therapy: Patient Spontanous Breathing and Patient connected to supplemental oxygen  Post-op Assessment: Post-op Vital signs reviewed, Patient's Cardiovascular Status Stable, Respiratory Function Stable, Patent Airway and No signs of Nausea or vomiting  Post-op Vital Signs: Reviewed and stable  Complications: No complications documented.

## 2019-12-29 NOTE — H&P (Signed)
..  History and Physical paper copy reviewed and updated date of procedure and will be scanned into system.  Patient seen and examined and marked.  

## 2019-12-29 NOTE — Anesthesia Postprocedure Evaluation (Signed)
Anesthesia Post Note  Patient: Raymond [name]  Procedure(s) Performed: NASAL SEPTOPLASTY WITH INFERIOR TURBINATE REDUCTION (Bilateral Nose) ENDOSCOPIC CONCHA BULLOSA RESECTION (Left Nose)     Patient location during evaluation: PACU Anesthesia Type: General Level of consciousness: awake and alert Pain management: pain level controlled Vital Signs Assessment: post-procedure vital signs reviewed and stable Respiratory status: spontaneous breathing, nonlabored ventilation, respiratory function stable and patient connected to nasal cannula oxygen Cardiovascular status: blood pressure returned to baseline and stable Postop Assessment: no apparent nausea or vomiting Anesthetic complications: no   No complications documented.  Fidel Levy

## 2019-12-30 ENCOUNTER — Encounter: Payer: Self-pay | Admitting: Otolaryngology

## 2020-01-17 ENCOUNTER — Other Ambulatory Visit: Payer: Self-pay | Admitting: Family Medicine

## 2020-01-17 DIAGNOSIS — F900 Attention-deficit hyperactivity disorder, predominantly inattentive type: Secondary | ICD-10-CM

## 2020-01-17 NOTE — Telephone Encounter (Signed)
Last office visit 12/09/2019 for CPE.  Last refilled 12/09/2019 for #60 with no refills.  No future appointments.

## 2020-01-17 NOTE — Telephone Encounter (Signed)
We have confirmed that this was 280 mL not tablets.

## 2020-01-17 NOTE — Telephone Encounter (Signed)
Would you call [facility].    Ask them to send a message to Dr. Pryor Ochoa.  I wanted him to know that Al filled #280 Vicodin 7.5 mg tablets under his name.  This seems like a lot, so I wanted to make sure there was no diversion.

## 2020-01-17 NOTE — Telephone Encounter (Signed)
Would you call [name] and confirm that he did receive #280 pills of Norco 7.5 mg tablets?

## 2020-01-17 NOTE — Telephone Encounter (Signed)
Spoke with Diane at [employer].  She confirmed that Lennette Bihari received #280 Norco on 12/29/2019.

## 2020-01-17 NOTE — Telephone Encounter (Signed)
So looking at patient's discharge instructions, it appears to me patient was prescribed liquid Norco so it was probably 280 ml and not 280 tablets.  Can [facility] and confirmed that it was MLs.

## 2020-01-21 DIAGNOSIS — J301 Allergic rhinitis due to pollen: Secondary | ICD-10-CM | POA: Diagnosis not present

## 2020-02-08 ENCOUNTER — Other Ambulatory Visit: Payer: Self-pay | Admitting: Family Medicine

## 2020-02-08 DIAGNOSIS — F900 Attention-deficit hyperactivity disorder, predominantly inattentive type: Secondary | ICD-10-CM

## 2020-02-08 NOTE — Telephone Encounter (Signed)
Last office visit 10/21/201 for CPE.  Last refilled 01/17/2020 for #60 with no refills.  No future appointments. UDS/Contract 12/06/2019.

## 2020-02-22 ENCOUNTER — Other Ambulatory Visit: Payer: Self-pay | Admitting: Otolaryngology

## 2020-02-22 DIAGNOSIS — J300 Vasomotor rhinitis: Secondary | ICD-10-CM | POA: Diagnosis not present

## 2020-02-29 ENCOUNTER — Other Ambulatory Visit: Payer: Self-pay | Admitting: Otolaryngology

## 2020-02-29 DIAGNOSIS — H9202 Otalgia, left ear: Secondary | ICD-10-CM | POA: Diagnosis not present

## 2020-02-29 DIAGNOSIS — J301 Allergic rhinitis due to pollen: Secondary | ICD-10-CM | POA: Diagnosis not present

## 2020-02-29 DIAGNOSIS — H93292 Other abnormal auditory perceptions, left ear: Secondary | ICD-10-CM | POA: Diagnosis not present

## 2020-02-29 DIAGNOSIS — H6982 Other specified disorders of Eustachian tube, left ear: Secondary | ICD-10-CM | POA: Diagnosis not present

## 2020-03-03 ENCOUNTER — Other Ambulatory Visit: Payer: BC Managed Care – PPO

## 2020-03-03 DIAGNOSIS — Z20822 Contact with and (suspected) exposure to covid-19: Secondary | ICD-10-CM

## 2020-03-07 LAB — NOVEL CORONAVIRUS, NAA: SARS-CoV-2, NAA: DETECTED — AB

## 2020-03-08 ENCOUNTER — Telehealth: Payer: Self-pay

## 2020-03-08 NOTE — Telephone Encounter (Signed)
Called to discuss with patient about COVID-19 symptoms and the use of one of the available treatments for those with mild to moderate Covid symptoms and at a high risk of hospitalization.  Pt appears to qualify for outpatient treatment due to co-morbid conditions and/or a member of an at-risk group in accordance with the FDA Emergency Use Authorization.    Symptom onset: 03/01/20 Vaccinated: Yes Booster? No Immunocompromised? No Qualifiers: HTN  Unable to reach pt - Pt. Is out of 7 day window of treatment.   Marcello Moores

## 2020-03-30 ENCOUNTER — Ambulatory Visit (INDEPENDENT_AMBULATORY_CARE_PROVIDER_SITE_OTHER): Payer: BC Managed Care – PPO | Admitting: Family Medicine

## 2020-03-30 ENCOUNTER — Encounter: Payer: Self-pay | Admitting: Family Medicine

## 2020-03-30 ENCOUNTER — Other Ambulatory Visit: Payer: Self-pay | Admitting: Family Medicine

## 2020-03-30 ENCOUNTER — Other Ambulatory Visit: Payer: Self-pay

## 2020-03-30 VITALS — BP 120/84 | HR 93 | Temp 98.1°F | Ht 67.75 in | Wt 188.5 lb

## 2020-03-30 DIAGNOSIS — K644 Residual hemorrhoidal skin tags: Secondary | ICD-10-CM | POA: Diagnosis not present

## 2020-03-30 DIAGNOSIS — R6882 Decreased libido: Secondary | ICD-10-CM

## 2020-03-30 DIAGNOSIS — N528 Other male erectile dysfunction: Secondary | ICD-10-CM

## 2020-03-30 DIAGNOSIS — F900 Attention-deficit hyperactivity disorder, predominantly inattentive type: Secondary | ICD-10-CM

## 2020-03-30 MED ORDER — AMPHETAMINE-DEXTROAMPHETAMINE 15 MG PO TABS: 1.0000 | ORAL_TABLET | Freq: Two times a day (BID) | ORAL | 0 refills | Status: DC

## 2020-03-30 MED ORDER — TRAMADOL HCL 50 MG PO TABS
ORAL_TABLET | ORAL | 3 refills | Status: DC
Start: 2020-03-30 — End: 2020-03-30

## 2020-03-30 NOTE — Progress Notes (Signed)
Raymond [name] T. Raymond Inzunza, MD, [place]  [facility] and [facility] at [facility] [place], [address]  Phone: [phone]  FAX: [phone]  Raymond [name] - 45 y.o. male  MRN [id number]  Date of Birth: 24-Jun-1975  Date: 03/30/2020  PCP: [name], MD  Referral: [name], MD  Chief Complaint  Patient presents with  . Hemorrhoids  . Knot in Rectum    This visit occurred during the SARS-CoV-2 public health emergency.  Safety protocols were in place, including screening questions prior to the visit, additional usage of staff PPE, and extensive cleaning of exam room while observing appropriate contact time as indicated for disinfecting solutions.   Subjective:   Raymond [name] is a 45 y.o. very pleasant male patient with Body mass index is 28.87 kg/m. who presents with the following:  Hemorrhoids.   Has had them - at least 4 years.  Varies with BM: Does note that he has had some pain off and on for several years, and at times will be quite uncomfortable.  Occasionally, he will have a increased volume at the hemorrhoid as well.  ED:  Harder to get them.  He and his wife are trying to have sex about 4 times a week, but he often loses his erection, quite commonly.  This is even with him taking 100 mg of Viagra prior to intercourse.  He also is having some issues with some premature ejaculation when he is able to get a full erection.  He is not able to keep his erection with masturbation either.  Taking 100 mg of Viagra.   Loses an erection.  Inferior fairly large hemorrhoid. [facility] for banding    Review of Systems is noted in the HPI, as appropriate  Objective:   BP 120/84   Pulse 93   Temp 98.1 F (36.7 C) (Temporal)   Ht 5' 7.75" (1.721 m)   Wt 188 lb 8 oz (85.5 kg)   SpO2 95%   BMI 28.87 kg/m   GEN: No acute distress; alert,appropriate. PULM: Breathing comfortably in no respiratory  distress PSYCH: Normally interactive.   External inferior hemorrhoid is visualized exam.  Laboratory and Imaging Data:  Assessment and Plan:     ICD-10-CM   1. External hemorrhoid  K64.4 Ambulatory referral to Gastroenterology  2. Other male erectile dysfunction  N52.8 Testos,Total,Free and SHBG (Male)  3. Decreased libido  R68.82 Testos,Total,Free and SHBG (Male)  4. Attention deficit hyperactivity disorder (ADHD), predominantly inattentive type  F90.0 amphetamine-dextroamphetamine (ADDERALL) 15 MG tablet   I think that he should see GI.  These are certainly bothering him all the time.  They might be able to band this.  Erectile dysfunction and decreased libido at age 45.  This is reasonably uncommon.  I am going to check a testosterone today.  If this comes back abnormal then additional endocrinology work-up is appropriate.  For premature ejaculation, I think that some tramadol is probably his best bet.  Meds ordered this encounter  Medications  . traMADol (ULTRAM) 50 MG tablet    Sig: Take 45 minutes before intercourse    Dispense:  20 tablet    Refill:  3  . amphetamine-dextroamphetamine (ADDERALL) 15 MG tablet    Sig: Take 1 tablet by mouth 2 (two) times daily.    Dispense:  60 tablet    Refill:  0   Medications Discontinued During This Encounter  Medication Reason  .  amoxicillin-clavulanate (AUGMENTIN) 875-125 MG tablet Completed Course  . amphetamine-dextroamphetamine (ADDERALL) 15 MG tablet Reorder   Orders Placed This Encounter  Procedures  . Testos,Total,Free and SHBG (Male)  . Ambulatory referral to Gastroenterology    Follow-up: No follow-ups on file.  Signed,  Maud Deed. Latoyia Tecson, MD   Outpatient Encounter Medications as of 03/30/2020  Medication Sig  . acetaminophen (TYLENOL) 650 MG CR tablet Take 650 mg by mouth every 8 (eight) hours as needed for pain.  . ARIPiprazole (ABILIFY) 10 MG tablet Take 1 tablet (10 mg total) by mouth daily.  .  cholecalciferol (VITAMIN D) 25 MCG (1000 UNIT) tablet Take 1 tablet (1,000 Units total) by mouth daily. For bone health  . escitalopram (LEXAPRO) 20 MG tablet TAKE 1 TABLET (20 MG TOTAL) BY MOUTH DAILY.  Marland Kitchen etodolac (LODINE) 500 MG tablet Take 500 mg by mouth 2 (two) times daily as needed.  . hydrochlorothiazide (HYDRODIURIL) 25 MG tablet TAKE 1 TABLET BY MOUTH DAILY FOR HIGH BLOOD PRESSURE  . hydrOXYzine (ATARAX/VISTARIL) 50 MG tablet TAKE 1 TABLET BY MOUTH EVERY 6 HOURS AS NEEDED FOR ANXIETY.  Marland Kitchen ipratropium (ATROVENT) 0.03 % nasal spray Place 2 sprays into both nostrils at bedtime.  . ondansetron (ZOFRAN) 4 MG tablet Take 1 tablet (4 mg total) by mouth every 8 (eight) hours as needed for up to 10 doses for nausea or vomiting.  Marland Kitchen OVER THE COUNTER MEDICATION CBD gummies  . potassium chloride SA (K-DUR,KLOR-CON) 20 MEQ tablet Take 1 tablet (20 mEq total) by mouth daily. For low potassium  . sildenafil (VIAGRA) 100 MG tablet Take 0.5-1 tablets (50-100 mg total) by mouth daily as needed for erectile dysfunction.  . traMADol (ULTRAM) 50 MG tablet Take 45 minutes before intercourse  . traZODone (DESYREL) 150 MG tablet TAKE 1 TO 2 TABLETS BY MOUTH AT BEDTIME FOR SLEEP  . [DISCONTINUED] amphetamine-dextroamphetamine (ADDERALL) 15 MG tablet TAKE 1 TABLET BY MOUTH 2 TIMES DAILY.  Marland Kitchen amphetamine-dextroamphetamine (ADDERALL) 15 MG tablet Take 1 tablet by mouth 2 (two) times daily.  . [DISCONTINUED] amoxicillin-clavulanate (AUGMENTIN) 875-125 MG tablet Take 1 tablet by mouth 2 (two) times daily.   No facility-administered encounter medications on file as of 03/30/2020.

## 2020-04-03 LAB — TESTOS,TOTAL,FREE AND SHBG (FEMALE)
Free Testosterone: 83.4 pg/mL (ref 35.0–155.0)
Sex Hormone Binding: 45 nmol/L (ref 10–50)
Testosterone, Total, LC-MS-MS: 587 ng/dL (ref 250–1100)

## 2020-05-02 ENCOUNTER — Other Ambulatory Visit: Payer: Self-pay | Admitting: Family Medicine

## 2020-05-02 DIAGNOSIS — F900 Attention-deficit hyperactivity disorder, predominantly inattentive type: Secondary | ICD-10-CM

## 2020-05-02 NOTE — Telephone Encounter (Signed)
Last office visit 03/30/2020 for hemorrhoids.  Last refilled 03/30/2020 for #60 with no refills. No future appointments.  UDS/Contract 12/06/2019.

## 2020-05-03 ENCOUNTER — Other Ambulatory Visit: Payer: Self-pay | Admitting: Family Medicine

## 2020-05-23 ENCOUNTER — Other Ambulatory Visit: Payer: Self-pay

## 2020-05-23 DIAGNOSIS — F331 Major depressive disorder, recurrent, moderate: Secondary | ICD-10-CM | POA: Diagnosis not present

## 2020-06-11 ENCOUNTER — Other Ambulatory Visit: Payer: Self-pay | Admitting: Family Medicine

## 2020-06-12 ENCOUNTER — Other Ambulatory Visit: Payer: Self-pay

## 2020-06-12 ENCOUNTER — Other Ambulatory Visit: Payer: Self-pay | Admitting: Family Medicine

## 2020-06-12 DIAGNOSIS — F900 Attention-deficit hyperactivity disorder, predominantly inattentive type: Secondary | ICD-10-CM

## 2020-06-12 MED ORDER — AMPHETAMINE-DEXTROAMPHETAMINE 15 MG PO TABS
1.0000 | ORAL_TABLET | Freq: Two times a day (BID) | ORAL | 0 refills | Status: DC
  Filled 2020-06-12: qty 60, 30d supply, fill #0

## 2020-06-12 MED ORDER — TRAZODONE HCL 150 MG PO TABS
ORAL_TABLET | ORAL | 1 refills | Status: DC
  Filled 2020-06-12: qty 180, 90d supply, fill #0
  Filled 2020-09-29: qty 180, 90d supply, fill #1

## 2020-06-22 ENCOUNTER — Other Ambulatory Visit: Payer: Self-pay

## 2020-06-26 ENCOUNTER — Other Ambulatory Visit: Payer: Self-pay

## 2020-06-28 ENCOUNTER — Other Ambulatory Visit: Payer: Self-pay

## 2020-06-28 MED ORDER — AMOXICILLIN 875 MG PO TABS
875.0000 mg | ORAL_TABLET | Freq: Two times a day (BID) | ORAL | 0 refills | Status: DC
  Filled 2020-06-28: qty 20, 10d supply, fill #0

## 2020-07-06 NOTE — Telephone Encounter (Signed)
Can we get [place] an appointment to see someone face to face in the office tomorrow?

## 2020-07-06 NOTE — Telephone Encounter (Signed)
Spoke with patient scheduled appointment

## 2020-07-07 ENCOUNTER — Other Ambulatory Visit: Payer: Self-pay

## 2020-07-07 ENCOUNTER — Ambulatory Visit (INDEPENDENT_AMBULATORY_CARE_PROVIDER_SITE_OTHER): Payer: BC Managed Care – PPO | Admitting: Family Medicine

## 2020-07-07 VITALS — BP 138/98 | HR 97 | Temp 98.4°F | Ht 68.0 in | Wt 192.8 lb

## 2020-07-07 DIAGNOSIS — R059 Cough, unspecified: Secondary | ICD-10-CM | POA: Diagnosis not present

## 2020-07-07 MED ORDER — DOXYCYCLINE HYCLATE 100 MG PO TABS
100.0000 mg | ORAL_TABLET | Freq: Two times a day (BID) | ORAL | 0 refills | Status: DC
  Filled 2020-07-07: qty 20, 10d supply, fill #0

## 2020-07-07 MED ORDER — ALBUTEROL SULFATE HFA 108 (90 BASE) MCG/ACT IN AERS
2.0000 | INHALATION_SPRAY | Freq: Four times a day (QID) | RESPIRATORY_TRACT | 2 refills | Status: DC | PRN
  Filled 2020-07-07: qty 8.5, 25d supply, fill #0

## 2020-07-07 MED ORDER — PROMETHAZINE-DM 6.25-15 MG/5ML PO SYRP
5.0000 mL | ORAL_SOLUTION | Freq: Every evening | ORAL | 0 refills | Status: DC | PRN
  Filled 2020-07-07: qty 20, 4d supply, fill #0
  Filled 2020-07-07: qty 40, 8d supply, fill #0
  Filled 2020-07-07: qty 98, 19d supply, fill #0

## 2020-07-07 NOTE — Patient Instructions (Addendum)
Avoid decongestants. Fill cough suppressant at night.  Can use albuterol prn wheeze. Repeat COVID home test just in case Complete course of doxycycline 100 mg BID x 10 days. 

## 2020-07-07 NOTE — Progress Notes (Signed)
Patient ID: Raymond [name], male    DOB: [birth year], 45 y.o.   MRN: [id number]  This visit was conducted in person.  BP (!) 138/98   Pulse 97   Temp 98.4 F (36.9 C) (Temporal)   Ht 5\' 8"  (1.727 m)   Wt 192 lb 12 oz (87.4 kg)   SpO2 96%   BMI 29.31 kg/m    CC:  Chief Complaint  Patient presents with   Cough    X 2 weeks with yellow/brown mucous Had negative home test around 06/21/20   Wheezing    X 1 week    Nasal Congestion    Subjective:   HPI: Raymond [name] is a 45 y.o. male smoker presenting on 07/07/2020 for Cough (X 2 weeks with yellow/brown mucous/Had negative home test around 06/21/20), Wheezing (X 1 week ), and Nasal Congestion  PCP Dr. Lorelei Pont  Date of Onset:  06/26/2020 Treated for  Sinus infection 06/27/2020 amox  875 mg BID  Nasal congestion over face. Face pain bilaterally.  Had fever initially. No body aches.. had some initially. Minimal improvement.  Now with cough, occ productive of mucus, chest congestion. No recent fever. Has some SOB with moving around.  Using Vicks, Dayquil, Mucinex consistently for over a week.  Cough keeping him up at night. He is a current heavy smoker 2 packs per day,  No COPD, no asthma.   COVID 19 screen COVID testing: 06/26/2020 negative home test COVID vaccine: 10/02/2019 , 09/11/2019 COVID exposure: No recent travel or known exposure to [place]  The importance of social distancing was discussed today.     Relevant past medical, surgical, family and social history reviewed and updated as indicated. Interim medical history since our last visit reviewed. Allergies and medications reviewed and updated. Outpatient Medications Prior to Visit  Medication Sig Dispense Refill   acetaminophen (TYLENOL) 650 MG CR tablet Take 650 mg by mouth every 8 (eight) hours as needed for pain.     amoxicillin (AMOXIL) 875 MG tablet Take 1 tablet (875 mg total) by mouth 2 (two) times daily for 10 days. 20 tablet 0    amphetamine-dextroamphetamine (ADDERALL) 15 MG tablet TAKE 1 TABLET BY MOUTH TWO TIMES DAILY. 60 tablet 0   ARIPiprazole (ABILIFY) 10 MG tablet TAKE 1 TABLET BY MOUTH DAILY. 90 tablet 3   cholecalciferol (VITAMIN D) 25 MCG (1000 UNIT) tablet Take 1 tablet (1,000 Units total) by mouth daily. For bone health 90 tablet 3   escitalopram (LEXAPRO) 20 MG tablet TAKE 1 TABLET (20 MG TOTAL) BY MOUTH DAILY. 90 tablet 1   etodolac (LODINE) 500 MG tablet Take 500 mg by mouth 2 (two) times daily as needed.     etodolac (LODINE) 500 MG tablet TAKE 1 TABLET BY MOUTH 2 TIMES DAILY. 60 tablet 3   hydrochlorothiazide (HYDRODIURIL) 25 MG tablet TAKE 1 TABLET BY MOUTH DAILY FOR HIGH BLOOD PRESSURE 90 tablet 1   hydrOXYzine (ATARAX/VISTARIL) 50 MG tablet TAKE 1 TABLET BY MOUTH EVERY 6 HOURS AS NEEDED FOR ANXIETY. 75 tablet 1   ipratropium (ATROVENT) 0.03 % nasal spray Place 2 sprays into both nostrils at bedtime.     ipratropium (ATROVENT) 0.03 % nasal spray SPRAY 2 SPRAYS IN EACH NOSTRIL 3 TIMES DAILY AS NEEDED FOR DRAINAGE 30 mL 11   ondansetron (ZOFRAN) 4 MG tablet TAKE 1 TABLET (4 MG TOTAL) BY MOUTH EVERY 8 (EIGHT) HOURS AS NEEDED FOR UP TO 10 DOSES FOR NAUSEA OR VOMITING. 20 tablet 0  OVER THE COUNTER MEDICATION CBD gummies     potassium chloride SA (K-DUR,KLOR-CON) 20 MEQ tablet Take 1 tablet (20 mEq total) by mouth daily. For low potassium 90 tablet 1   sildenafil (VIAGRA) 100 MG tablet Take 0.5-1 tablets (50-100 mg total) by mouth daily as needed for erectile dysfunction. 10 tablet 5   traMADol (ULTRAM) 50 MG tablet TAKE 1 TABLET BY MOUTH 45 MINUTES BEFORE INTERCOURSE 20 tablet 3   traZODone (DESYREL) 150 MG tablet TAKE 1 TO 2 TABLETS BY MOUTH AT BEDTIME FOR SLEEP 180 tablet 1   amoxicillin-clavulanate (AUGMENTIN) 875-125 MG tablet TAKE 1 TABLET BY MOUTH TWICE DAILY 20 tablet 0   predniSONE (DELTASONE) 10 MG tablet TAKE 6 TABS BY MOUTH ON DAY 1; 5 TABS ON DAY 2; 4 TABS ON DAY 3; 3 TABS ON DAY 4; 2 TABS ON DAY  5; 1 TAB ON DAY 6 THEN STOP 21 tablet 11   No facility-administered medications prior to visit.     Per HPI unless specifically indicated in ROS section below Review of Systems  All other systems reviewed and are negative. Objective:  BP (!) 138/98   Pulse 97   Temp 98.4 F (36.9 C) (Temporal)   Ht 5\' 8"  (1.727 m)   Wt 192 lb 12 oz (87.4 kg)   SpO2 96%   BMI 29.31 kg/m   Wt Readings from Last 3 Encounters:  07/07/20 192 lb 12 oz (87.4 kg)  03/30/20 188 lb 8 oz (85.5 kg)  12/29/19 179 lb (81.2 kg)      Physical Exam Constitutional:      Appearance: He is well-developed.  HENT:     Head: Normocephalic.     Right Ear: Hearing, tympanic membrane and external ear normal.     Left Ear: Hearing, tympanic membrane and external ear normal.     Nose: Congestion present.     Mouth/Throat:     Mouth: Mucous membranes are moist.     Pharynx: No oropharyngeal exudate or posterior oropharyngeal erythema.  Neck:     Thyroid: No thyroid mass or thyromegaly.     Vascular: No carotid bruit.     Trachea: Trachea normal.  Cardiovascular:     Rate and Rhythm: Normal rate and regular rhythm.     Pulses: Normal pulses.     Heart sounds: Heart sounds not distant. No murmur heard.   No friction rub. No gallop.     Comments: No peripheral edema Pulmonary:     Effort: Pulmonary effort is normal. No respiratory distress.     Breath sounds: Normal breath sounds.  Skin:    General: Skin is warm and dry.     Findings: No rash.  Psychiatric:        Speech: Speech normal.        Behavior: Behavior normal.        Thought Content: Thought content normal.      Results for orders placed or performed in visit on 03/30/20  Testos,Total,Free and SHBG (Male)  Result Value Ref Range   Testosterone, Total, LC-MS-MS 587 250 - 1,100 ng/dL   Free Testosterone 83.4 35.0 - 155.0 pg/mL   Sex Hormone Binding 45 10 - 50 nmol/L    This visit occurred during the SARS-CoV-2 public health emergency.   Safety protocols were in place, including screening questions prior to the visit, additional usage of staff PPE, and extensive cleaning of exam room while observing appropriate contact time as indicated for disinfecting solutions.  COVID 19 screen:  No recent travel or known exposure to COVID19 The patient denies respiratory symptoms of COVID 19 at this time. The importance of social distancing was discussed today.   Assessment and Plan     Eliezer Lofts, MD

## 2020-07-10 ENCOUNTER — Other Ambulatory Visit: Payer: Self-pay

## 2020-07-11 MED ORDER — PREDNISONE 20 MG PO TABS
ORAL_TABLET | ORAL | 0 refills | Status: DC
  Filled 2020-07-11: qty 15, 7d supply, fill #0

## 2020-07-12 ENCOUNTER — Other Ambulatory Visit: Payer: Self-pay

## 2020-07-19 ENCOUNTER — Other Ambulatory Visit: Payer: Self-pay

## 2020-07-19 ENCOUNTER — Other Ambulatory Visit: Payer: Self-pay | Admitting: Family Medicine

## 2020-07-19 DIAGNOSIS — F900 Attention-deficit hyperactivity disorder, predominantly inattentive type: Secondary | ICD-10-CM

## 2020-07-19 MED FILL — Amphetamine-Dextroamphetamine Tab 15 MG: ORAL | 30 days supply | Qty: 60 | Fill #0 | Status: AC

## 2020-07-19 NOTE — Telephone Encounter (Signed)
Refill request Adderall Last office visit 07/07/20 acuate No upcoming appointment scheduled Last UDS 12/06/19

## 2020-07-20 ENCOUNTER — Other Ambulatory Visit: Payer: Self-pay

## 2020-08-09 ENCOUNTER — Other Ambulatory Visit: Payer: Self-pay

## 2020-08-09 ENCOUNTER — Other Ambulatory Visit: Payer: Self-pay | Admitting: Family Medicine

## 2020-08-09 MED ORDER — HYDROXYZINE HCL 50 MG PO TABS
50.0000 mg | ORAL_TABLET | Freq: Four times a day (QID) | ORAL | 1 refills | Status: DC | PRN
  Filled 2020-08-09: qty 75, 19d supply, fill #0

## 2020-08-09 MED ORDER — ESCITALOPRAM OXALATE 20 MG PO TABS
ORAL_TABLET | Freq: Every day | ORAL | 1 refills | Status: DC
  Filled 2020-08-09: qty 90, 90d supply, fill #0

## 2020-08-09 MED ORDER — HYDROCHLOROTHIAZIDE 25 MG PO TABS
1.0000 | ORAL_TABLET | Freq: Every day | ORAL | 1 refills | Status: DC
Start: 2020-08-09 — End: 2022-03-04
  Filled 2020-08-09: qty 90, 90d supply, fill #0

## 2020-08-09 MED FILL — Etodolac Tab 500 MG: ORAL | 30 days supply | Qty: 60 | Fill #0 | Status: AC

## 2020-08-09 MED FILL — Aripiprazole Tab 10 MG: ORAL | 90 days supply | Qty: 90 | Fill #0 | Status: AC

## 2020-08-10 ENCOUNTER — Other Ambulatory Visit: Payer: Self-pay

## 2020-08-19 DIAGNOSIS — R059 Cough, unspecified: Secondary | ICD-10-CM | POA: Insufficient documentation

## 2020-08-19 NOTE — Assessment & Plan Note (Signed)
Avoid decongestants. Fill cough suppressant at night.  Can use albuterol prn wheeze. Repeat COVID home test just in case Complete course of doxycycline 100 mg BID x 10 days.

## 2020-08-25 ENCOUNTER — Other Ambulatory Visit: Payer: Self-pay | Admitting: Family Medicine

## 2020-08-25 ENCOUNTER — Other Ambulatory Visit: Payer: Self-pay

## 2020-08-25 DIAGNOSIS — F900 Attention-deficit hyperactivity disorder, predominantly inattentive type: Secondary | ICD-10-CM

## 2020-08-25 NOTE — Telephone Encounter (Signed)
Last office visit 07/07/2020 with Dr. Diona Browner for cough. Last refilled 07/19/2020 for #60 with no refills.  CPE scheduled for 12/13/2020.

## 2020-08-27 MED ORDER — AMPHETAMINE-DEXTROAMPHETAMINE 15 MG PO TABS
1.0000 | ORAL_TABLET | Freq: Two times a day (BID) | ORAL | 0 refills | Status: DC
  Filled 2020-08-27: qty 60, 30d supply, fill #0

## 2020-08-28 ENCOUNTER — Other Ambulatory Visit: Payer: Self-pay

## 2020-09-29 ENCOUNTER — Other Ambulatory Visit: Payer: Self-pay | Admitting: Family Medicine

## 2020-09-29 ENCOUNTER — Other Ambulatory Visit: Payer: Self-pay

## 2020-09-29 DIAGNOSIS — F900 Attention-deficit hyperactivity disorder, predominantly inattentive type: Secondary | ICD-10-CM

## 2020-09-29 MED FILL — Amphetamine-Dextroamphetamine Tab 15 MG: ORAL | 30 days supply | Qty: 60 | Fill #0 | Status: AC

## 2020-09-29 NOTE — Telephone Encounter (Signed)
Last office visit 07/07/2020 with Dr. Diona Browner for cough. Last refilled 08/27/2020 for #60 with no refills.  CPE scheduled for 12/13/2020.

## 2020-10-02 ENCOUNTER — Other Ambulatory Visit: Payer: Self-pay

## 2020-10-09 ENCOUNTER — Telehealth: Payer: Self-pay

## 2020-10-09 ENCOUNTER — Ambulatory Visit: Payer: BC Managed Care – PPO | Admitting: Family Medicine

## 2020-10-09 NOTE — Telephone Encounter (Signed)
Apt has already been cancelled and pt was RS for 10/26

## 2020-10-09 NOTE — Telephone Encounter (Signed)
[place] Night - Client Nonclinical Telephone Record  [facility] Night - Client Client Site [place] Physician [name] - MD Contact Type Call Who Is Calling Patient / Member / Family / Caregiver Caller Name [place] Phone Number [phone] Patient Name Raymond [name] Patient DOB 05/11/1975 Call Type Message Only Information Provided Reason for Call Request to [facility] Appointment Initial Comment Caller states she will like to cancel an appointment this morning. Patient request to speak to RN No Additional Comment Provided office hours. Disp. Time Disposition Final User 10/09/2020 8:08:46 AM General Information Provided Yes Hammett, Kristen Call Closed By: [place] Billing Transaction Date/Time: 10/09/2020 8:05:15 AM (ET)

## 2020-11-17 ENCOUNTER — Other Ambulatory Visit: Payer: Self-pay

## 2020-11-17 ENCOUNTER — Other Ambulatory Visit: Payer: Self-pay | Admitting: Family Medicine

## 2020-11-17 DIAGNOSIS — F900 Attention-deficit hyperactivity disorder, predominantly inattentive type: Secondary | ICD-10-CM

## 2020-11-20 ENCOUNTER — Other Ambulatory Visit: Payer: Self-pay

## 2020-11-20 MED FILL — Amphetamine-Dextroamphetamine Tab 15 MG: ORAL | 30 days supply | Qty: 60 | Fill #0 | Status: AC

## 2020-12-06 ENCOUNTER — Other Ambulatory Visit: Payer: BC Managed Care – PPO

## 2020-12-13 ENCOUNTER — Other Ambulatory Visit: Payer: Self-pay

## 2020-12-13 ENCOUNTER — Ambulatory Visit (INDEPENDENT_AMBULATORY_CARE_PROVIDER_SITE_OTHER): Payer: 59 | Admitting: Family Medicine

## 2020-12-13 ENCOUNTER — Encounter: Payer: Self-pay | Admitting: Family Medicine

## 2020-12-13 VITALS — BP 110/76 | HR 61 | Temp 97.4°F | Ht 67.75 in | Wt 170.5 lb

## 2020-12-13 DIAGNOSIS — Z23 Encounter for immunization: Secondary | ICD-10-CM | POA: Diagnosis not present

## 2020-12-13 DIAGNOSIS — Z0000 Encounter for general adult medical examination without abnormal findings: Secondary | ICD-10-CM | POA: Diagnosis not present

## 2020-12-13 DIAGNOSIS — Z13220 Encounter for screening for lipoid disorders: Secondary | ICD-10-CM

## 2020-12-13 DIAGNOSIS — R5383 Other fatigue: Secondary | ICD-10-CM | POA: Diagnosis not present

## 2020-12-13 DIAGNOSIS — Z1159 Encounter for screening for other viral diseases: Secondary | ICD-10-CM | POA: Diagnosis not present

## 2020-12-13 DIAGNOSIS — Z114 Encounter for screening for human immunodeficiency virus [HIV]: Secondary | ICD-10-CM | POA: Diagnosis not present

## 2020-12-13 DIAGNOSIS — Z131 Encounter for screening for diabetes mellitus: Secondary | ICD-10-CM

## 2020-12-13 LAB — CBC WITH DIFFERENTIAL/PLATELET
Basophils Absolute: 0.1 10*3/uL (ref 0.0–0.1)
Basophils Relative: 0.7 % (ref 0.0–3.0)
Eosinophils Absolute: 0.1 10*3/uL (ref 0.0–0.7)
Eosinophils Relative: 1.8 % (ref 0.0–5.0)
HCT: 44.8 % (ref 39.0–52.0)
Hemoglobin: 15.5 g/dL (ref 13.0–17.0)
Lymphocytes Relative: 37.8 % (ref 12.0–46.0)
Lymphs Abs: 3 10*3/uL (ref 0.7–4.0)
MCHC: 34.6 g/dL (ref 30.0–36.0)
MCV: 88.1 fl (ref 78.0–100.0)
Monocytes Absolute: 0.5 10*3/uL (ref 0.1–1.0)
Monocytes Relative: 6.1 % (ref 3.0–12.0)
Neutro Abs: 4.2 10*3/uL (ref 1.4–7.7)
Neutrophils Relative %: 53.6 % (ref 43.0–77.0)
Platelets: 254 10*3/uL (ref 150.0–400.0)
RBC: 5.08 Mil/uL (ref 4.22–5.81)
RDW: 13.1 % (ref 11.5–15.5)
WBC: 7.9 10*3/uL (ref 4.0–10.5)

## 2020-12-13 LAB — LIPID PANEL
Cholesterol: 212 mg/dL — ABNORMAL HIGH (ref 0–200)
HDL: 41.4 mg/dL (ref >39.00)
LDL Cholesterol: 139 mg/dL — ABNORMAL HIGH (ref 0–99)
NonHDL: 170.52
Total CHOL/HDL Ratio: 5
Triglycerides: 156 mg/dL — ABNORMAL HIGH (ref 0.0–149.0)
VLDL: 31.2 mg/dL (ref 0.0–40.0)

## 2020-12-13 LAB — HEPATIC FUNCTION PANEL
ALT: 20 U/L (ref 0–53)
AST: 14 U/L (ref 0–37)
Albumin: 4.8 g/dL (ref 3.5–5.2)
Alkaline Phosphatase: 76 U/L (ref 39–117)
Bilirubin, Direct: 0.1 mg/dL (ref 0.0–0.3)
Total Bilirubin: 0.4 mg/dL (ref 0.2–1.2)
Total Protein: 7.2 g/dL (ref 6.0–8.3)

## 2020-12-13 LAB — BASIC METABOLIC PANEL
BUN: 11 mg/dL (ref 6–23)
CO2: 30 mEq/L (ref 19–32)
Calcium: 9.6 mg/dL (ref 8.4–10.5)
Chloride: 105 mEq/L (ref 96–112)
Creatinine, Ser: 1.13 mg/dL (ref 0.40–1.50)
GFR: 79.81 mL/min (ref >60.00)
Glucose, Bld: 88 mg/dL (ref 70–99)
Potassium: 5.2 mEq/L — ABNORMAL HIGH (ref 3.5–5.1)
Sodium: 140 mEq/L (ref 135–145)

## 2020-12-13 LAB — HEMOGLOBIN A1C: Hgb A1c MFr Bld: 5.4 % (ref 4.6–6.5)

## 2020-12-13 NOTE — Progress Notes (Signed)
[name], MD, [facility] [facility] [place], [address]  Phone: [phone]  FAX: [phone]  Raymond [name] - 45 y.o. male  MRN [id number]  Date of Birth: January 03, 1976  Date: 12/13/2020  PCP: [name], MD  Referral: [name], MD  Chief Complaint  Patient presents with   Annual Exam    This visit occurred during the SARS-CoV-2 public health emergency.  Safety protocols were in place, including screening questions prior to the visit, additional usage of staff PPE, and extensive cleaning of exam room while observing appropriate contact time as indicated for disinfecting solutions.   Patient Care Team: [name], MD as PCP - General (Family Medicine) Subjective:   Raymond [name] is a 45 y.o. pleasant patient who presents with the following:  Preventative Health Maintenance Visit:  Health Maintenance Summary Reviewed and updated, unless pt declines services.  Tobacco History Reviewed. Quit Alcohol: No concerns, no excessive use Exercise Habits: Some activity, rec at least 30 mins 5 times a week STD concerns: no risk or activity to increase risk Drug Use: None  He has lost a lot of weight, family doing great,   All labs  HIV, Hep C  Stopped his anti-depresants  No ETOH  Quit smoking in August.   Health Maintenance  Topic Date Due   Pneumococcal Vaccine 49-38 Years old (1 - PCV) Never done   Hepatitis C Screening  Never done   COVID-19 Vaccine (3 - Pfizer risk series) 10/30/2019   TETANUS/TDAP  08/14/2026   INFLUENZA VACCINE  Completed   HIV Screening  Completed   HPV VACCINES  Aged Out   Immunization History  Administered Date(s) Administered   Influenza,inj,Quad PF,6+ Mos 11/18/2018, 12/09/2019, 12/13/2020   PFIZER(Purple Top)SARS-COV-2 Vaccination 09/11/2019, 10/02/2019   Tdap 08/13/2016   Patient Active Problem List   Diagnosis Date Noted   Alcohol use  disorder, severe, dependence ([place]) 05/09/2017   Major depressive disorder, recurrent severe without psychotic features ([place]) 05/08/2017   Generalized anxiety disorder 05/31/2016   Attention deficit hyperactivity disorder (ADHD), predominantly inattentive type 05/19/2014   HTN (hypertension) 04/06/2013   Allergic rhinitis 01/18/2013   Family history of long QT syndrome 06/12/2012   Cardiac arrest-aborted 06/12/2012   Smoker 06/12/2012   Common migraine 05/26/2012   Fibromyalgia 05/26/2012    Past Medical History:  Diagnosis Date   Alcohol use disorder, severe, dependence ([place]) 05/09/2017   Attention deficit hyperactivity disorder (ADHD), predominantly inattentive type 05/19/2014   Deviated septum    Generalized anxiety disorder 05/31/2016   GERD (gastroesophageal reflux disease)    Headache    sinus issues   History of gastric ulcer    summer 2015   Hypertension    has not been taking his medication; advised to start back on med. today   Major depressive disorder, recurrent severe without psychotic features ([place]) 05/08/2017   Nasal turbinate hypertrophy    Osteoarthritis of shoulder 01/2014   right, arms, elbows, neck    Past Surgical History:  Procedure Laterality Date   CHOLECYSTECTOMY     COLONOSCOPY WITH PROPOFOL N/A 05/14/2016   Procedure: COLONOSCOPY WITH PROPOFOL;  Surgeon: Lucilla Lame, MD;  Location: [facility];  Service: Endoscopy;  Laterality: N/A;   ENDOSCOPIC CONCHA BULLOSA RESECTION Left 12/29/2019   Procedure: ENDOSCOPIC CONCHA BULLOSA RESECTION;  Surgeon: Carloyn Manner, MD;  Location: [place];  Service: ENT;  Laterality: Left;   ESOPHAGOGASTRODUODENOSCOPY (EGD) WITH PROPOFOL N/A  05/14/2016   Procedure: ESOPHAGOGASTRODUODENOSCOPY (EGD) WITH PROPOFOL;  Surgeon: Lucilla Lame, MD;  Location: [facility];  Service: Endoscopy;  Laterality: N/A;   NASAL SEPTOPLASTY W/ TURBINOPLASTY Bilateral 12/29/2019   Procedure: NASAL SEPTOPLASTY WITH INFERIOR  TURBINATE REDUCTION;  Surgeon: Carloyn Manner, MD;  Location: [place];  Service: ENT;  Laterality: Bilateral;   RESECTION DISTAL CLAVICAL Right 02/21/2014   Procedure: RESECTION DISTAL CLAVICAL;  Surgeon: Nita Sells, MD;  Location: [place];  Service: Orthopedics;  Laterality: Right;   SHOULDER ARTHROSCOPY WITH SUBACROMIAL DECOMPRESSION Right 02/21/2014   Procedure: SHOULDER ARTHROSCOPY WITH SUBACROMIAL DECOMPRESSION, DISTAL CLAVICAL EXCISION, DEBRIDIMENT OF PARTIAL ROTATOR CUFF TEAR;  Surgeon: Nita Sells, MD;  Location: [place];  Service: Orthopedics;  Laterality: Right;  Right shoulder arthroscopy subacromial decompression, distal clavical excision    Family History  Problem Relation Age of Onset   Diabetes Mother    Heart disease Mother    Long QT syndrome Mother    Lung disease Father    Diabetes Father     Past Medical History, Surgical History, Social History, Family History, Problem List, Medications, and Allergies have been reviewed and updated if relevant.  Review of Systems: Pertinent positives are listed above.  Otherwise, a full 14 point review of systems has been done in full and it is negative except where it is noted positive.  Objective:   BP 110/76   Pulse 61   Temp (!) 97.4 F (36.3 C) (Temporal)   Ht 5' 7.75" (1.721 m)   Wt 170 lb 8 oz (77.3 kg)   SpO2 96%   BMI 26.12 kg/m  Ideal Body Weight: Weight in (lb) to have BMI = 25: 162.9  Ideal Body Weight: Weight in (lb) to have BMI = 25: 162.9 No results found. Depression screen [facility] 2/9 12/09/2019 08/23/2016  Decreased Interest 0 0  Down, Depressed, Hopeless 0 0  PHQ - 2 Score 0 0  Altered sleeping 3 -  Tired, decreased energy 2 -  Change in appetite 0 -  Feeling bad or failure about yourself  0 -  Trouble concentrating 2 -  Moving slowly or fidgety/restless 0 -  Suicidal thoughts 0 -  PHQ-9 Score 7 -  Difficult doing work/chores Not  difficult at all -     GEN: well developed, well nourished, no acute distress Eyes: conjunctiva and lids normal, PERRLA, EOMI ENT: TM clear, nares clear, oral exam WNL Neck: supple, no lymphadenopathy, no thyromegaly, no JVD Pulm: clear to auscultation and percussion, respiratory effort normal CV: regular rate and rhythm, S1-S2, no murmur, rub or gallop, no bruits, peripheral pulses normal and symmetric, no cyanosis, clubbing, edema or varicosities GI: soft, non-tender; no hepatosplenomegaly, masses; active bowel sounds all quadrants GU: deferred Lymph: no cervical, axillary or inguinal adenopathy MSK: gait normal, muscle tone and strength WNL, no joint swelling, effusions, discoloration, crepitus  SKIN: clear, good turgor, color WNL, no rashes, lesions, or ulcerations Neuro: normal mental status, normal strength, sensation, and motion Psych: alert; oriented to person, place and time, normally interactive and not anxious or depressed in appearance.  All labs reviewed with patient. Results for orders placed or performed in visit on 03/30/20  Testos,Total,Free and SHBG (Male)  Result Value Ref Range   Testosterone, Total, LC-MS-MS 587 250 - 1,100 ng/dL   Free Testosterone 83.4 35.0 - 155.0 pg/mL   Sex Hormone Binding 45 10 - 50 nmol/L    Assessment and Plan:     ICD-10-CM  1. Healthcare maintenance  Z00.00     2. Need for influenza vaccination  Z23 Flu Vaccine QUAD 6+ mos PF IM (Fluarix Quad PF)    3. Screening for diabetes mellitus  Z79.1 Basic metabolic panel    Hemoglobin A1c    4. Screening for HIV (human immunodeficiency virus)  Z11.4 HIV Antibody (routine testing w rflx)    5. Other fatigue  R53.83 CBC with Differential/Platelet    Hepatic function panel    6. Need for hepatitis C screening test  Z11.59 Hepatitis C antibody    7. Screening, lipid  Z13.220 Lipid panel     He is really doing great.  Lost a lot of weight, completely off of alcohol for a long time,  family life is going really well now.   I am very happy for him.  We will check routine labs today.  Health Maintenance Exam: The patient's preventative maintenance and recommended screening tests for an annual wellness exam were reviewed in full today. Brought up to date unless services declined.  Counselled on the importance of diet, exercise, and its role in overall health and mortality. The patient's FH and SH was reviewed, including their home life, tobacco status, and drug and alcohol status.  Follow-up in 1 year for physical exam or additional follow-up below.  Follow-up: No follow-ups on file. Or follow-up in 1 year if not noted.  No orders of the defined types were placed in this encounter.  Medications Discontinued During This Encounter  Medication Reason   albuterol (VENTOLIN HFA) 108 (90 Base) MCG/ACT inhaler Completed Course   ARIPiprazole (ABILIFY) 10 MG tablet Completed Course   cholecalciferol (VITAMIN D) 25 MCG (1000 UNIT) tablet Completed Course   doxycycline (VIBRA-TABS) 100 MG tablet Completed Course   escitalopram (LEXAPRO) 20 MG tablet Completed Course   etodolac (LODINE) 500 MG tablet Completed Course   ipratropium (ATROVENT) 0.03 % nasal spray Completed Course   ondansetron (ZOFRAN) 4 MG tablet Completed Course   promethazine-dextromethorphan (PROMETHAZINE-DM) 6.25-15 MG/5ML syrup Completed Course   predniSONE (DELTASONE) 20 MG tablet Completed Course   potassium chloride SA (K-DUR,KLOR-CON) 20 MEQ tablet Completed Course   OVER THE COUNTER MEDICATION Completed Course   hydrOXYzine (ATARAX/VISTARIL) 50 MG tablet No longer needed (for PRN medications)   Orders Placed This Encounter  Procedures   Flu Vaccine QUAD 6+ mos PF IM (Fluarix Quad PF)   Basic metabolic panel   CBC with Differential/Platelet   Hepatic function panel   Hemoglobin A1c   Lipid panel   Hepatitis C antibody   HIV Antibody (routine testing w rflx)    Signed,  Raymond Victoria T. Nela Bascom,  MD   Allergies as of 12/13/2020       Reactions   Azithromycin Nausea And Vomiting   Percocet [oxycodone-acetaminophen] Hives   Vicodin [hydrocodone-acetaminophen] Hives   Adhesive [tape] Other (See Comments)   SKIN IRRITATION        Medication List        Accurate as of December 13, 2020  8:58 AM. If you have any questions, ask your nurse or doctor.          STOP taking these medications    albuterol 108 (90 Base) MCG/ACT inhaler Commonly known as: VENTOLIN HFA Stopped by: [name], MD   ARIPiprazole 10 MG tablet Commonly known as: ABILIFY Stopped by: [name], MD   cholecalciferol 25 MCG (1000 UNIT) tablet Commonly known as: VITAMIN D Stopped by: [name], MD   doxycycline 100 MG  tablet Commonly known as: VIBRA-TABS Stopped by: [name], MD   escitalopram 20 MG tablet Commonly known as: LEXAPRO Stopped by: [name], MD   etodolac 500 MG tablet Commonly known as: LODINE Stopped by: [name], MD   hydrOXYzine 50 MG tablet Commonly known as: ATARAX/VISTARIL Stopped by: [name], MD   ondansetron 4 MG tablet Commonly known as: ZOFRAN Stopped by: [name], MD   OVER THE COUNTER MEDICATION Stopped by: [name], MD   potassium chloride SA 20 MEQ tablet Commonly known as: KLOR-CON Stopped by: [name], MD   predniSONE 20 MG tablet Commonly known as: DELTASONE Stopped by: [name], MD   promethazine-dextromethorphan 6.25-15 MG/5ML syrup Commonly known as: PROMETHAZINE-DM Stopped by: [name], MD       TAKE these medications    acetaminophen 650 MG CR tablet Commonly known as: TYLENOL Take 650 mg by mouth every 8 (eight) hours as needed for pain.   amphetamine-dextroamphetamine 15 MG tablet Commonly known as: ADDERALL TAKE 1 TABLET BY MOUTH TWO TIMES DAILY.   hydrochlorothiazide 25 MG tablet Commonly known as: HYDRODIURIL TAKE 1 TABLET BY MOUTH DAILY FOR  HIGH BLOOD PRESSURE   ipratropium 0.03 % nasal spray Commonly known as: ATROVENT SPRAY 2 SPRAYS IN EACH NOSTRIL 3 TIMES DAILY AS NEEDED FOR DRAINAGE What changed: Another medication with the same name was removed. Continue taking this medication, and follow the directions you see here. Changed by: [name], MD   sildenafil 100 MG tablet Commonly known as: Viagra Take 0.5-1 tablets (50-100 mg total) by mouth daily as needed for erectile dysfunction.   traZODone 150 MG tablet Commonly known as: DESYREL TAKE 1 TO 2 TABLETS BY MOUTH AT BEDTIME FOR SLEEP

## 2020-12-14 LAB — HEPATITIS C ANTIBODY
Hepatitis C Ab: NONREACTIVE
SIGNAL TO CUT-OFF: 0.09 (ref <1.00)

## 2020-12-14 LAB — HIV ANTIBODY (ROUTINE TESTING W REFLEX): HIV 1&2 Ab, 4th Generation: NONREACTIVE

## 2021-01-15 ENCOUNTER — Other Ambulatory Visit: Payer: Self-pay

## 2021-01-15 ENCOUNTER — Other Ambulatory Visit: Payer: Self-pay | Admitting: Family Medicine

## 2021-01-15 DIAGNOSIS — F900 Attention-deficit hyperactivity disorder, predominantly inattentive type: Secondary | ICD-10-CM

## 2021-01-15 MED FILL — Amphetamine-Dextroamphetamine Tab 15 MG: ORAL | 30 days supply | Qty: 60 | Fill #0 | Status: AC

## 2021-01-15 NOTE — Telephone Encounter (Signed)
Last office visit 12/13/2020 for CPE.  Last refilled 11/20/2020 for #60 with no refills.  No future appointments.

## 2021-01-16 ENCOUNTER — Other Ambulatory Visit: Payer: Self-pay

## 2021-01-19 ENCOUNTER — Other Ambulatory Visit: Payer: Self-pay

## 2021-02-01 DIAGNOSIS — H524 Presbyopia: Secondary | ICD-10-CM | POA: Diagnosis not present

## 2021-02-17 ENCOUNTER — Ambulatory Visit
Admission: EM | Admit: 2021-02-17 | Discharge: 2021-02-17 | Disposition: A | Discharge status: Home / Self Care | Payer: 59 | Attending: Emergency Medicine | Admitting: Emergency Medicine

## 2021-02-17 ENCOUNTER — Other Ambulatory Visit: Payer: Self-pay

## 2021-02-17 ENCOUNTER — Encounter: Payer: Self-pay | Admitting: Emergency Medicine

## 2021-02-17 DIAGNOSIS — B349 Viral infection, unspecified: Secondary | ICD-10-CM

## 2021-02-17 MED ORDER — BENZONATATE 100 MG PO CAPS: 100.0000 mg | ORAL_CAPSULE | Freq: Three times a day (TID) | ORAL | 0 refills | Status: DC | PRN

## 2021-02-17 NOTE — ED Provider Notes (Signed)
Raymond [name]    CSN: [id number] Arrival date & time: 02/17/21  0831      History   Chief Complaint Chief Complaint  Patient presents with   Generalized Body Aches   Fatigue   Nasal Congestion    HPI Raymond [name] is a 45 y.o. male.  Patient presents with fatigue, body aches, nasal congestion, scratchy throat, cough for 5 to 6 days.  No fever, rash, shortness of breath, vomiting, diarrhea, or other symptoms.  He took 3 days worth of leftover amoxicillin which did not help his symptoms.  He has also taken DayQuil and NyQuil.  Patient's medical history includes osteoarthritis, fibromyalgia, ADHD, anxiety, depression, alcohol use disorder, former smoker, migraine headaches, hypertension, gastric ulcer.  The history is provided by the patient and medical records.   Past Medical History:  Diagnosis Date   Alcohol use disorder, severe, dependence ([place]) 05/09/2017   Attention deficit hyperactivity disorder (ADHD), predominantly inattentive type 05/19/2014   Deviated septum    Generalized anxiety disorder 05/31/2016   GERD (gastroesophageal reflux disease)    Headache    sinus issues   History of gastric ulcer    summer 2015   Hypertension    has not been taking his medication; advised to start back on med. 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   right, arms, elbows, neck    Patient Active Problem List   Diagnosis Date Noted   Alcohol use disorder, severe, dependence ([place]) 05/09/2017   Major depressive disorder, recurrent severe without psychotic features ([place]) 05/08/2017   Generalized anxiety disorder 05/31/2016   Attention deficit hyperactivity disorder (ADHD), predominantly inattentive type 05/19/2014   HTN (hypertension) 04/06/2013   Allergic rhinitis 01/18/2013   Family history of long QT syndrome 06/12/2012   Cardiac arrest-aborted  06/12/2012   Smoker 06/12/2012   Common migraine 05/26/2012   Fibromyalgia 05/26/2012    Past Surgical History:  Procedure Laterality Date   CHOLECYSTECTOMY     COLONOSCOPY WITH PROPOFOL N/A 05/14/2016   Procedure: COLONOSCOPY WITH PROPOFOL;  Surgeon: Lucilla Lame, MD;  Location: [facility];  Service: Endoscopy;  Laterality: N/A;   ENDOSCOPIC CONCHA BULLOSA RESECTION Left 12/29/2019   Procedure: ENDOSCOPIC CONCHA BULLOSA RESECTION;  Surgeon: Carloyn Manner, MD;  Location: [facility];  Service: ENT;  Laterality: Left;   ESOPHAGOGASTRODUODENOSCOPY (EGD) WITH PROPOFOL N/A 05/14/2016   Procedure: ESOPHAGOGASTRODUODENOSCOPY (EGD) WITH PROPOFOL;  Surgeon: Lucilla Lame, MD;  Location: [facility];  Service: Endoscopy;  Laterality: N/A;   NASAL SEPTOPLASTY W/ TURBINOPLASTY Bilateral 12/29/2019   Procedure: NASAL SEPTOPLASTY WITH INFERIOR TURBINATE REDUCTION;  Surgeon: Carloyn Manner, MD;  Location: [place];  Service: ENT;  Laterality: Bilateral;   RESECTION DISTAL CLAVICAL Right 02/21/2014   Procedure: RESECTION DISTAL CLAVICAL;  Surgeon: Nita Sells, MD;  Location: [place];  Service: Orthopedics;  Laterality: Right;   SHOULDER ARTHROSCOPY WITH SUBACROMIAL DECOMPRESSION Right 02/21/2014   Procedure: SHOULDER ARTHROSCOPY WITH SUBACROMIAL DECOMPRESSION, DISTAL CLAVICAL EXCISION, DEBRIDIMENT OF PARTIAL ROTATOR CUFF TEAR;  Surgeon: Nita Sells, MD;  Location: [place];  Service: Orthopedics;  Laterality: Right;  Right shoulder arthroscopy subacromial decompression, distal clavical excision       Home Medications    Prior to Admission medications   Medication Sig Start Date End Date Taking? Authorizing Provider  acetaminophen (TYLENOL) 650 MG CR tablet Take 650 mg by mouth every  8 (eight) hours as needed for pain.    [provider]  amphetamine-dextroamphetamine (ADDERALL) 15 MG tablet TAKE 1 TABLET BY  MOUTH TWO TIMES DAILY. 01/15/21 07/14/21  Copland, Frederico Hamman, MD  benzonatate (TESSALON) 100 MG capsule Take 1 capsule (100 mg total) by mouth 3 (three) times daily as needed for cough. 02/17/21  Yes Sharion Balloon, NP  hydrochlorothiazide (HYDRODIURIL) 25 MG tablet TAKE 1 TABLET BY MOUTH DAILY FOR HIGH BLOOD PRESSURE Patient not taking: Reported on 12/13/2020 08/09/20   Copland, Frederico Hamman, MD  ipratropium (ATROVENT) 0.03 % nasal spray SPRAY 2 SPRAYS IN EACH NOSTRIL 3 TIMES DAILY AS NEEDED FOR DRAINAGE 02/22/20 02/21/21  Carloyn Manner, MD  sildenafil (VIAGRA) 100 MG tablet Take 0.5-1 tablets (50-100 mg total) by mouth daily as needed for erectile dysfunction. 09/25/18   Copland, Frederico Hamman, MD  traZODone (DESYREL) 150 MG tablet TAKE 1 TO 2 TABLETS BY MOUTH AT BEDTIME FOR SLEEP 06/12/20 06/12/21  Copland, Frederico Hamman, MD    Family History Family History  Problem Relation Age of Onset   Diabetes Mother    Heart disease Mother    Long QT syndrome Mother    Lung disease Father    Diabetes Father     Social History Social History   Tobacco Use   Smoking status: Former    Packs/day: 2.00    Years: 20.00    Pack years: 40.00    Types: Cigarettes    Quit date: 10/11/2020    Years since quitting: 0.3   Smokeless tobacco: Never  Substance Use Topics   Alcohol use: Not Currently    Alcohol/week: 0.0 standard drinks   Drug use: No     Allergies   Azithromycin, Percocet [oxycodone-acetaminophen], Vicodin [hydrocodone-acetaminophen], and Adhesive [tape]   Review of Systems Review of Systems  Constitutional:  Positive for fatigue. Negative for chills and fever.  HENT:  Positive for congestion, postnasal drip, rhinorrhea and sore throat. Negative for ear pain.   Respiratory:  Positive for cough. Negative for shortness of breath.   Cardiovascular:  Negative for chest pain and palpitations.  Gastrointestinal:  Negative for diarrhea and vomiting.  Skin:  Negative for color change and rash.  All other  systems reviewed and are negative.   Physical Exam Triage Vital Signs ED Triage Vitals  Enc Vitals Group     BP 02/17/21 0859 (!) 144/72     Pulse Rate 02/17/21 0859 74     Resp 02/17/21 0859 20     Temp 02/17/21 0859 98 F (36.7 C)     Temp src --      SpO2 02/17/21 0859 96 %     Weight --      Height --      Head Circumference --      Peak Flow --      Pain Score 02/17/21 0907 4     Pain Loc --      Pain Edu? --      Excl. in [place]? --    No data found.  Updated Vital Signs BP (!) 144/72    Pulse 74    Temp 98 F (36.7 C)    Resp 20    SpO2 96%   Visual Acuity Right Eye Distance:   Left Eye Distance:   Bilateral Distance:    Right Eye Near:   Left Eye Near:    Bilateral Near:     Physical Exam Vitals and nursing note reviewed.  Constitutional:  General: He is not in acute distress.    Appearance: Normal appearance. He is well-developed. He is not ill-appearing.  HENT:     Right Ear: Tympanic membrane normal.     Left Ear: Tympanic membrane normal.     Nose: Nose normal.     Mouth/Throat:     Mouth: Mucous membranes are moist.     Pharynx: Oropharynx is clear.  Cardiovascular:     Rate and Rhythm: Normal rate and regular rhythm.     Heart sounds: Normal heart sounds.  Pulmonary:     Effort: Pulmonary effort is normal. No respiratory distress.     Breath sounds: Normal breath sounds.  Musculoskeletal:     Cervical back: Neck supple.  Skin:    General: Skin is warm and dry.  Neurological:     Mental Status: He is alert.  Psychiatric:        Mood and Affect: Mood normal.        Behavior: Behavior normal.     UC Treatments / Results  Labs (all labs ordered are listed, but only abnormal results are displayed) Labs Reviewed  COVID-19, FLU A+B AND RSV    EKG   Radiology No results found.  Procedures Procedures (including critical care time)  Medications Ordered in UC Medications - No data to display  Initial Impression / Assessment and  Plan / UC Course  I have reviewed the triage vital signs and the nursing notes.  Pertinent labs & imaging results that were available during my care of the patient were reviewed by me and considered in my medical decision making (see chart for details).    Viral illness.  Cautioned patient not to take left-over antibiotics.  COVID, Flu, and RSV pending.  Instructed patient to self quarantine per CDC guidelines.  Discussed symptomatic treatment including Tessalon Perles, Tylenol or ibuprofen, rest, hydration.  Instructed patient to follow up with PCP if symptoms are not improving.  Patient agrees to plan of care.   Final Clinical Impressions(s) / UC Diagnoses   Final diagnoses:  Viral illness     Discharge Instructions      Your COVID, RSV, and Flu tests are pending.  You should self quarantine until the test results are back.    Take the [facility] as needed for cough.  Take Tylenol or ibuprofen as needed for fever or discomfort.  Rest and keep yourself hydrated.    Follow-up with your primary care provider if your symptoms are not improving.         ED Prescriptions     Medication Sig Dispense Auth. Provider   benzonatate (TESSALON) 100 MG capsule Take 1 capsule (100 mg total) by mouth 3 (three) times daily as needed for cough. 21 capsule Sharion Balloon, NP      PDMP not reviewed this encounter.   Sharion Balloon, NP 02/17/21 [phone]

## 2021-02-17 NOTE — Discharge Instructions (Addendum)
Your COVID, RSV, and Flu tests are pending.  You should self quarantine until the test results are back.    Take the [facility] as needed for cough.  Take Tylenol or ibuprofen as needed for fever or discomfort.  Rest and keep yourself hydrated.    Follow-up with your primary care provider if your symptoms are not improving.

## 2021-02-17 NOTE — ED Triage Notes (Signed)
Pt here with URI sx, body aches, fatigue, but no fever x 6 days. Requests send out testing for flu covid and RSV

## 2021-02-19 LAB — COVID-19, FLU A+B AND RSV
Influenza A, NAA: NOT DETECTED
Influenza B, NAA: NOT DETECTED
RSV, NAA: NOT DETECTED
SARS-CoV-2, NAA: NOT DETECTED

## 2021-03-08 ENCOUNTER — Encounter: Payer: Self-pay | Admitting: Family Medicine

## 2021-03-08 ENCOUNTER — Telehealth (INDEPENDENT_AMBULATORY_CARE_PROVIDER_SITE_OTHER): Payer: 59 | Admitting: Nurse Practitioner

## 2021-03-08 ENCOUNTER — Encounter: Payer: Self-pay | Admitting: Nurse Practitioner

## 2021-03-08 VITALS — Temp 100.1°F

## 2021-03-08 DIAGNOSIS — R509 Fever, unspecified: Secondary | ICD-10-CM

## 2021-03-08 DIAGNOSIS — U071 COVID-19: Secondary | ICD-10-CM | POA: Diagnosis not present

## 2021-03-08 DIAGNOSIS — R52 Pain, unspecified: Secondary | ICD-10-CM | POA: Diagnosis not present

## 2021-03-08 DIAGNOSIS — R051 Acute cough: Secondary | ICD-10-CM

## 2021-03-08 LAB — POCT INFLUENZA A/B
Influenza A, POC: NEGATIVE
Influenza B, POC: NEGATIVE

## 2021-03-08 LAB — POC COVID19 BINAXNOW: SARS Coronavirus 2 Ag: POSITIVE — AB

## 2021-03-08 NOTE — Assessment & Plan Note (Signed)
Patient came to office for POC testing tested positive for COVID-19 and negative for flu.  Discussed antiviral treatment synovator EUA only currently.  Patient like to defer on using medication at current time states he wants to think about it and speak to his spouse about it.  Discussed timeline in regards to when he needs to start medications.  Patient acknowledged.  Did discuss CDC recommendations in regards to quarantine.  Also discussed signs and symptoms when patient needs to be seen urgent or emergently.  Continue over-the-counter regimens as we discussed and reach out if he decides he wants to use antiviral treatment

## 2021-03-08 NOTE — Progress Notes (Signed)
Patient ID: Raymond [name], male    DOB: Apr 28, 1975, 46 y.o.   MRN: [id number]  Virtual visit completed through [place], a video enabled telemedicine application. Due to national recommendations of social distancing due to COVID-19, a virtual visit is felt to be most appropriate for this patient at this time. Reviewed limitations, risks, security and privacy concerns of performing a virtual visit and the availability of in person appointments. I also reviewed that there may be a patient responsible charge related to this service. The patient agreed to proceed.   Patient location: home Provider location: [place] at [facility], office Persons participating in this virtual visit: patient, provider   If any vitals were documented, they were collected by patient at home unless specified below.    Temp 100.1 F (37.8 C) Comment: per patient   CC: Nasal Congestion  Subjective:   HPI: Raymond [name] is a 46 y.o. male presenting on 03/08/2021 for Nasal Congestion (Sx started on 03/06/21-Sneezing, sinus pressure, ear ache, fever 100.1, head pressure, coughing up yellowish/green mucus, scratchy throat, body aches, chills. Covid test was not done. Has taking Vix and Tylenol for his symptoms.)   Symptoms started on Tuesday  No covid test yet Pfizer x 2 Flu vaccine this season Wife has been sick and is still sick  Vicks cold and flu and tylenol sinus. Has not made a difference    Relevant past medical, surgical, family and social history reviewed and updated as indicated. Interim medical history since our last visit reviewed. Allergies and medications reviewed and updated. Outpatient Medications Prior to Visit  Medication Sig Dispense Refill   acetaminophen (TYLENOL) 650 MG CR tablet Take 650 mg by mouth every 8 (eight) hours as needed for pain.     amphetamine-dextroamphetamine (ADDERALL) 15 MG tablet TAKE 1 TABLET BY MOUTH TWO TIMES DAILY. 60 tablet 0   sildenafil (VIAGRA) 100 MG tablet  Take 0.5-1 tablets (50-100 mg total) by mouth daily as needed for erectile dysfunction. 10 tablet 5   hydrochlorothiazide (HYDRODIURIL) 25 MG tablet TAKE 1 TABLET BY MOUTH DAILY FOR HIGH BLOOD PRESSURE (Patient not taking: Reported on 12/13/2020) 90 tablet 1   ipratropium (ATROVENT) 0.03 % nasal spray SPRAY 2 SPRAYS IN EACH NOSTRIL 3 TIMES DAILY AS NEEDED FOR DRAINAGE (Patient not taking: Reported on 03/08/2021) 30 mL 11   benzonatate (TESSALON) 100 MG capsule Take 1 capsule (100 mg total) by mouth 3 (three) times daily as needed for cough. 21 capsule 0   traZODone (DESYREL) 150 MG tablet TAKE 1 TO 2 TABLETS BY MOUTH AT BEDTIME FOR SLEEP (Patient not taking: Reported on 03/08/2021) 180 tablet 1   No facility-administered medications prior to visit.     Per HPI unless specifically indicated in ROS section below Review of Systems  Constitutional:  Positive for chills, fatigue and fever. Negative for appetite change.  HENT:  Positive for congestion, ear pain (stabbing pain), postnasal drip, sinus pressure and sore throat (scratchy).   Respiratory:  Positive for cough (yellow and clear). Negative for shortness of breath.   Cardiovascular:  Negative for chest pain.  Gastrointestinal:  Negative for abdominal pain, diarrhea, nausea and vomiting.  Musculoskeletal:  Positive for arthralgias and myalgias.  Neurological:  Positive for headaches.  Objective:  Temp 100.1 F (37.8 C) Comment: per patient  Wt Readings from Last 3 Encounters:  12/13/20 170 lb 8 oz (77.3 kg)  07/07/20 192 lb 12 oz (87.4 kg)  03/30/20 188 lb 8 oz (85.5 kg)  Physical exam: Gen: alert, NAD, not ill appearing Pulm: speaks in complete sentences without increased work of breathing Psych: normal mood, normal thought content      Results for orders placed or performed during the hospital encounter of 02/17/21  COVID-19, Flu A+B and RSV ([facility])  Result Value Ref Range   SARS-CoV-2, NAA Not Detected Not Detected    Influenza A, NAA Not Detected Not Detected   Influenza B, NAA Not Detected Not Detected   RSV, NAA Not Detected Not Detected   Test Information: Comment    Assessment & Plan:   Problem List Items Addressed This Visit       Other   COVID-19    Patient came to office for POC testing tested positive for COVID-19 and negative for flu.  Discussed antiviral treatment synovator EUA only currently.  Patient like to defer on using medication at current time states he wants to think about it and speak to his spouse about it.  Discussed timeline in regards to when he needs to start medications.  Patient acknowledged.  Did discuss CDC recommendations in regards to quarantine.  Also discussed signs and symptoms when patient needs to be seen urgent or emergently.  Continue over-the-counter regimens as we discussed and reach out if he decides he wants to use antiviral treatment      Other Visit Diagnoses     Fever, unspecified fever cause    -  Primary   Relevant Orders   POC COVID-19 (Completed)   Influenza A/B (Completed)   Body aches       Relevant Orders   POC COVID-19 (Completed)   Influenza A/B (Completed)   Acute cough       Relevant Orders   POC COVID-19 (Completed)   Influenza A/B (Completed)        No orders of the defined types were placed in this encounter.  No orders of the defined types were placed in this encounter.   I discussed the assessment and treatment plan with the patient. The patient was provided an opportunity to ask questions and all were answered. The patient agreed with the plan and demonstrated an understanding of the instructions. The patient was advised to call back or seek an in-person evaluation if the symptoms worsen or if the condition fails to improve as anticipated.  Follow up plan: No follow-ups on file.  Romilda Garret, NP

## 2021-03-09 ENCOUNTER — Other Ambulatory Visit: Payer: Self-pay

## 2021-03-09 ENCOUNTER — Other Ambulatory Visit: Payer: Self-pay | Admitting: Nurse Practitioner

## 2021-03-09 ENCOUNTER — Encounter: Payer: Self-pay | Admitting: Nurse Practitioner

## 2021-03-09 DIAGNOSIS — H9203 Otalgia, bilateral: Secondary | ICD-10-CM

## 2021-03-09 MED ORDER — PREDNISONE 20 MG PO TABS
20.0000 mg | ORAL_TABLET | Freq: Every day | ORAL | 0 refills | Status: AC
  Filled 2021-03-09: qty 5, 5d supply, fill #0

## 2021-03-12 NOTE — Telephone Encounter (Signed)
Spoke to patient by telephone and was advised that his ear is not currently bleeding. Patient stated that his left ear bleed some once yesterday morning. Patient stated that the ear pain level is about a level 4. Patient stated that he has not noticed any improvement in his ear since starting medication. Patient stated that everything sounds muffled in that ear.  Pharmacy  [place]

## 2021-03-12 NOTE — Telephone Encounter (Signed)
Sounds like he needs to be seen in person. Looking at the last office note he should be out of the first 5 days of his covid infection. We can see what his symptoms are like since it has been several days

## 2021-03-13 ENCOUNTER — Other Ambulatory Visit: Payer: Self-pay

## 2021-03-13 ENCOUNTER — Ambulatory Visit: Payer: 59 | Admitting: Nurse Practitioner

## 2021-03-13 ENCOUNTER — Encounter: Payer: Self-pay | Admitting: Nurse Practitioner

## 2021-03-13 VITALS — BP 124/76 | HR 60 | Temp 97.8°F | Resp 10 | Ht 67.75 in | Wt 175.5 lb

## 2021-03-13 DIAGNOSIS — H938X3 Other specified disorders of ear, bilateral: Secondary | ICD-10-CM | POA: Insufficient documentation

## 2021-03-13 DIAGNOSIS — H9202 Otalgia, left ear: Secondary | ICD-10-CM | POA: Insufficient documentation

## 2021-03-13 DIAGNOSIS — J0140 Acute pansinusitis, unspecified: Secondary | ICD-10-CM | POA: Insufficient documentation

## 2021-03-13 MED ORDER — AMOXICILLIN-POT CLAVULANATE 875-125 MG PO TABS
1.0000 | ORAL_TABLET | Freq: Two times a day (BID) | ORAL | 0 refills | Status: AC
  Filled 2021-03-13: qty 14, 7d supply, fill #0

## 2021-03-13 MED ORDER — FLUTICASONE PROPIONATE 50 MCG/ACT NA SUSP
2.0000 | Freq: Every day | NASAL | 0 refills | Status: DC
  Filled 2021-03-13: qty 16, 30d supply, fill #0

## 2021-03-13 NOTE — Assessment & Plan Note (Signed)
He can finish the prednisone today.  It was not of much benefit for him.  He can try some Flonase see if this is beneficial did give him precautions in regards to Flonase continue to monitor.  Did tell patient if he continues to have that muffled/decreased hearing on left side post antibiotic use he needs to follow back up in office patient acknowledged

## 2021-03-13 NOTE — Assessment & Plan Note (Signed)
Patient does have a scrape in the left ear canal.  Some erythema noted we will cover with Augmentin for other combined illnesses should cover for his otitis externa.  Return to clinic precautions discussed with patient

## 2021-03-13 NOTE — Patient Instructions (Signed)
Nice to see you today I sent in oral antibiotics to your pharmacy If your hearing does not return you need to let us know Follow up if no improvement or symptoms worsen

## 2021-03-13 NOTE — Progress Notes (Signed)
Acute Office Visit  Subjective:    Patient ID: Raymond [name], male    DOB: Nov 05, 1975, 46 y.o.   MRN: [id number]  Chief Complaint  Patient presents with   Ear Pain    Left ear, started on 03/06/21-getting worse, pain, popping and muffle sensation present. Had some bleeding on 03/10/21. No fever    HPI Patient is in today for Ear pain   Was seen virtually on 03/08/2021 and was dx with covid. His symptoms originally started on 03/06/2021 Did not want antiviral treatment at that time. States the majority of his symptoms have improved, minus the sinus and ear pressure and now left ear pain. Prednisone was prescribed and he did not notice a relief. Last dose of prednisone would be today. He has not   States that this past Sunday he noticed blood out of his left ear. States he cleaned the ear after and has not placed anything into his ear  Past Medical History:  Diagnosis Date   Alcohol use disorder, severe, dependence ([place]) 05/09/2017   Attention deficit hyperactivity disorder (ADHD), predominantly inattentive type 05/19/2014   Deviated septum    Generalized anxiety disorder 05/31/2016   GERD (gastroesophageal reflux disease)    Headache    sinus issues   History of gastric ulcer    summer 2015   Hypertension    has not been taking his medication; advised to start back on med. today   Major depressive disorder, recurrent severe without psychotic features ([place]) 05/08/2017   Nasal turbinate hypertrophy    Osteoarthritis of shoulder 01/2014   right, arms, elbows, neck    Past Surgical History:  Procedure Laterality Date   CHOLECYSTECTOMY     COLONOSCOPY WITH PROPOFOL N/A 05/14/2016   Procedure: COLONOSCOPY WITH PROPOFOL;  Surgeon: Lucilla Lame, MD;  Location: [facility];  Service: Endoscopy;  Laterality: N/A;   ENDOSCOPIC CONCHA BULLOSA RESECTION Left 12/29/2019   Procedure: ENDOSCOPIC CONCHA BULLOSA RESECTION;  Surgeon: Carloyn Manner, MD;  Location: [place];   Service: ENT;  Laterality: Left;   ESOPHAGOGASTRODUODENOSCOPY (EGD) WITH PROPOFOL N/A 05/14/2016   Procedure: ESOPHAGOGASTRODUODENOSCOPY (EGD) WITH PROPOFOL;  Surgeon: Lucilla Lame, MD;  Location: [facility];  Service: Endoscopy;  Laterality: N/A;   NASAL SEPTOPLASTY W/ TURBINOPLASTY Bilateral 12/29/2019   Procedure: NASAL SEPTOPLASTY WITH INFERIOR TURBINATE REDUCTION;  Surgeon: Carloyn Manner, MD;  Location: [place];  Service: ENT;  Laterality: Bilateral;   RESECTION DISTAL CLAVICAL Right 02/21/2014   Procedure: RESECTION DISTAL CLAVICAL;  Surgeon: Nita Sells, MD;  Location: [place];  Service: Orthopedics;  Laterality: Right;   SHOULDER ARTHROSCOPY WITH SUBACROMIAL DECOMPRESSION Right 02/21/2014   Procedure: SHOULDER ARTHROSCOPY WITH SUBACROMIAL DECOMPRESSION, DISTAL CLAVICAL EXCISION, DEBRIDIMENT OF PARTIAL ROTATOR CUFF TEAR;  Surgeon: Nita Sells, MD;  Location: [place];  Service: Orthopedics;  Laterality: Right;  Right shoulder arthroscopy subacromial decompression, distal clavical excision    Family History  Problem Relation Age of Onset   Diabetes Mother    Heart disease Mother    Long QT syndrome Mother    Lung disease Father    Diabetes Father     Social History   Socioeconomic History   Marital status: Married    Spouse name: Not on file   Number of children: Not on file   Years of education: Not on file   Highest education level: Not on file  Occupational History   Occupation: electrician    Employer: Programme researcher, broadcasting/film/video  Tobacco  Use   Smoking status: Former    Packs/day: 2.00    Years: 20.00    Pack years: 40.00    Types: Cigarettes    Quit date: 10/11/2020    Years since quitting: 0.4   Smokeless tobacco: Never  Substance and Sexual Activity   Alcohol use: Not Currently    Alcohol/week: 0.0 standard drinks   Drug use: No   Sexual activity: Yes    Partners: Female  Other Topics Concern   Not  on file  Social History Narrative   Mom was in hospital with long QT syndrome, then heart in A Fib.   Sister has long QT syndrome   1st cousin: long QT syndrome         Social Determinants of Radio broadcast assistant Strain: Not on file  Food Insecurity: Not on file  Transportation Needs: Not on file  Physical Activity: Not on file  Stress: Not on file  Social Connections: Not on file  Intimate Partner Violence: Not on file    Outpatient Medications Prior to Visit  Medication Sig Dispense Refill   acetaminophen (TYLENOL) 650 MG CR tablet Take 650 mg by mouth every 8 (eight) hours as needed for pain.     amphetamine-dextroamphetamine (ADDERALL) 15 MG tablet TAKE 1 TABLET BY MOUTH TWO TIMES DAILY. 60 tablet 0   hydrochlorothiazide (HYDRODIURIL) 25 MG tablet TAKE 1 TABLET BY MOUTH DAILY FOR HIGH BLOOD PRESSURE 90 tablet 1   predniSONE (DELTASONE) 20 MG tablet Take 1 tablet (20 mg total) by mouth daily with breakfast for 5 days. Do not take along with NSAIDs such as advil, aleve, naproxen, BC/Goody powders, or motrin 5 tablet 0   sildenafil (VIAGRA) 100 MG tablet Take 0.5-1 tablets (50-100 mg total) by mouth daily as needed for erectile dysfunction. 10 tablet 5   ipratropium (ATROVENT) 0.03 % nasal spray SPRAY 2 SPRAYS IN EACH NOSTRIL 3 TIMES DAILY AS NEEDED FOR DRAINAGE (Patient not taking: Reported on 03/08/2021) 30 mL 11   No facility-administered medications prior to visit.    Allergies  Allergen Reactions   Azithromycin Nausea And Vomiting   Percocet [Oxycodone-Acetaminophen] Hives   Vicodin [Hydrocodone-Acetaminophen] Hives   Adhesive [Tape] Other (See Comments)    SKIN IRRITATION    Review of Systems  Constitutional:  Negative for chills and fever.  HENT:  Positive for congestion, ear discharge, ear pain, postnasal drip, sinus pressure and sinus pain. Negative for sore throat.        Left side decreaed hearing   Respiratory:  Negative for cough and shortness of  breath.   Cardiovascular:  Negative for chest pain.  Gastrointestinal:  Negative for abdominal pain, constipation, diarrhea, nausea and vomiting.  Neurological:  Negative for headaches.      Objective:    Physical Exam Vitals and nursing note reviewed.  Constitutional:      Appearance: Normal appearance.  HENT:     Right Ear: Tympanic membrane, ear canal and external ear normal. There is no impacted cerumen.     Left Ear: Tympanic membrane and external ear normal. Laceration (looks as if the anterior ear canal was scraped) and tenderness present. There is no impacted cerumen. Tympanic membrane is not injected, perforated, erythematous or bulging.     Nose:     Right Sinus: Maxillary sinus tenderness and frontal sinus tenderness present.     Left Sinus: Maxillary sinus tenderness and frontal sinus tenderness present.     Mouth/Throat:     Mouth:  Mucous membranes are moist.     Pharynx: Oropharynx is clear.  Eyes:     Comments: Wears corrective lenses  Neck:     Thyroid: No thyroid mass, thyromegaly or thyroid tenderness.  Cardiovascular:     Rate and Rhythm: Normal rate and regular rhythm.  Pulmonary:     Effort: Pulmonary effort is normal.     Breath sounds: Normal breath sounds.  Lymphadenopathy:     Cervical: No cervical adenopathy.  Neurological:     Mental Status: He is alert.    BP 124/76    Pulse 60    Temp 97.8 F (36.6 C)    Resp 10    Ht 5' 7.75" (1.721 m)    Wt 175 lb 8 oz (79.6 kg)    SpO2 97%    BMI 26.88 kg/m  Wt Readings from Last 3 Encounters:  03/13/21 175 lb 8 oz (79.6 kg)  12/13/20 170 lb 8 oz (77.3 kg)  07/07/20 192 lb 12 oz (87.4 kg)    Health Maintenance Due  Topic Date Due   COVID-19 Vaccine (3 - Pfizer risk series) 10/30/2019    There are no preventive care reminders to display for this patient.   Lab Results  Component Value Date   TSH 1.09 01/26/2014   Lab Results  Component Value Date   WBC 7.9 12/13/2020   HGB 15.5 12/13/2020    HCT 44.8 12/13/2020   MCV 88.1 12/13/2020   PLT 254.0 12/13/2020   Lab Results  Component Value Date   NA 140 12/13/2020   K 5.2 (H) 12/13/2020   CO2 30 12/13/2020   GLUCOSE 88 12/13/2020   BUN 11 12/13/2020   CREATININE 1.13 12/13/2020   BILITOT 0.4 12/13/2020   ALKPHOS 76 12/13/2020   AST 14 12/13/2020   ALT 20 12/13/2020   PROT 7.2 12/13/2020   ALBUMIN 4.8 12/13/2020   CALCIUM 9.6 12/13/2020   ANIONGAP 17 (H) 05/07/2017   GFR 79.81 12/13/2020   Lab Results  Component Value Date   CHOL 212 (H) 12/13/2020   Lab Results  Component Value Date   HDL 41.40 12/13/2020   Lab Results  Component Value Date   LDLCALC 139 (H) 12/13/2020   Lab Results  Component Value Date   TRIG 156.0 (H) 12/13/2020   Lab Results  Component Value Date   CHOLHDL 5 12/13/2020   Lab Results  Component Value Date   HGBA1C 5.4 12/13/2020       Assessment & Plan:   Problem List Items Addressed This Visit       Respiratory   Acute non-recurrent pansinusitis - Primary    Patient is like the symptoms and physical exam we will treat for a pansinusitis.  Did discuss with patient.  Start patient on Augmentin 875-125 twice daily for 7 days.      Relevant Medications   amoxicillin-clavulanate (AUGMENTIN) 875-125 MG tablet   fluticasone (FLONASE) 50 MCG/ACT nasal spray     Nervous and Auditory   Sensation of fullness in both ears    He can finish the prednisone today.  It was not of much benefit for him.  He can try some Flonase see if this is beneficial did give him precautions in regards to Flonase continue to monitor.  Did tell patient if he continues to have that muffled/decreased hearing on left side post antibiotic use he needs to follow back up in office patient acknowledged      Relevant Medications   fluticasone (  FLONASE) 50 MCG/ACT nasal spray     Other   Otalgia, left ear    Patient does have a scrape in the left ear canal.  Some erythema noted we will cover with Augmentin  for other combined illnesses should cover for his otitis externa.  Return to clinic precautions discussed with patient        Meds ordered this encounter  Medications   amoxicillin-clavulanate (AUGMENTIN) 875-125 MG tablet    Sig: Take 1 tablet by mouth 2 (two) times daily for 7 days.    Dispense:  14 tablet    Refill:  0    Order Specific Question:   Supervising Provider    Answer:   Glori Bickers MARNE A [[id number]]   fluticasone (FLONASE) 50 MCG/ACT nasal spray    Sig: Place 2 sprays into both nostrils daily.    Dispense:  16 g    Refill:  0    Order Specific Question:   Supervising Provider    Answer:   Loura Pardon A [[id number]]   This visit occurred during the SARS-CoV-2 public health emergency.  Safety protocols were in place, including screening questions prior to the visit, additional usage of staff PPE, and extensive cleaning of exam room while observing appropriate contact time as indicated for disinfecting solutions.    Romilda Garret, NP

## 2021-03-13 NOTE — Assessment & Plan Note (Signed)
Patient is like the symptoms and physical exam we will treat for a pansinusitis.  Did discuss with patient.  Start patient on Augmentin 875-125 twice daily for 7 days.

## 2021-04-13 ENCOUNTER — Other Ambulatory Visit: Payer: Self-pay | Admitting: Family Medicine

## 2021-04-13 ENCOUNTER — Other Ambulatory Visit: Payer: Self-pay

## 2021-04-13 DIAGNOSIS — F900 Attention-deficit hyperactivity disorder, predominantly inattentive type: Secondary | ICD-10-CM

## 2021-04-13 NOTE — Telephone Encounter (Signed)
Last office visit 03/13/2021 with Raymond [name] for sinusitis & otalgia.  Last refilled 01/15/2021 for #60 with no refills.  No future appointments.

## 2021-04-14 MED ORDER — AMPHETAMINE-DEXTROAMPHETAMINE 15 MG PO TABS
1.0000 | ORAL_TABLET | Freq: Two times a day (BID) | ORAL | 0 refills | Status: DC
  Filled 2021-04-14: qty 60, 30d supply, fill #0

## 2021-04-15 DIAGNOSIS — B9689 Other specified bacterial agents as the cause of diseases classified elsewhere: Secondary | ICD-10-CM | POA: Diagnosis not present

## 2021-04-15 DIAGNOSIS — J019 Acute sinusitis, unspecified: Secondary | ICD-10-CM | POA: Diagnosis not present

## 2021-04-15 DIAGNOSIS — Z03818 Encounter for observation for suspected exposure to other biological agents ruled out: Secondary | ICD-10-CM | POA: Diagnosis not present

## 2021-04-16 ENCOUNTER — Other Ambulatory Visit: Payer: Self-pay

## 2021-05-06 ENCOUNTER — Encounter: Payer: Self-pay | Admitting: Family Medicine

## 2021-07-06 ENCOUNTER — Ambulatory Visit (INDEPENDENT_AMBULATORY_CARE_PROVIDER_SITE_OTHER): Payer: BC Managed Care – PPO | Admitting: Family

## 2021-07-06 VITALS — BP 140/82 | HR 76 | Temp 98.6°F | Ht 65.75 in | Wt 180.0 lb

## 2021-07-06 DIAGNOSIS — Z207 Contact with and (suspected) exposure to pediculosis, acariasis and other infestations: Secondary | ICD-10-CM | POA: Diagnosis not present

## 2021-07-06 DIAGNOSIS — R21 Rash and other nonspecific skin eruption: Secondary | ICD-10-CM | POA: Diagnosis not present

## 2021-07-06 MED ORDER — ELIMITE 5 % EX CREA: TOPICAL_CREAM | CUTANEOUS | 0 refills | Status: DC

## 2021-07-06 NOTE — Patient Instructions (Signed)
Due to recent changes in healthcare laws, you may see results of your imaging and/or laboratory studies on MyChart before I have had a chance to review them.  I understand that in some cases there may be results that are confusing or concerning to you. Please understand that not all results are received at the same time and often I may need to interpret multiple results in order to provide you with the best plan of care or course of treatment. Therefore, I ask that you please give me 2 business days to thoroughly review all your results before contacting my office for clarification. Should we see a critical lab result, you will be contacted sooner.   It was a pleasure seeing you today! Please do not hesitate to reach out with any questions and or concerns.  Regards,   Aydden Cumpian FNP-C  

## 2021-07-06 NOTE — Progress Notes (Signed)
Established Patient Office Visit  Subjective:  Patient ID: Raymond [name], male    DOB: May 10, 1975  Age: 46 y.o. MRN: [id number]  CC:  Chief Complaint  Patient presents with  . scabies    HPI Raymond [name] is here today with concerns.  Daughter recently diagnosed with scabies.  Started noticing the rash about four days ago. Itching everywhere also between skin folds on arms, on trunk back and legs. Constantly itching.   Past Medical History:  Diagnosis Date  . Alcohol use disorder, severe, dependence ([place]) 05/09/2017  . 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 Deviated septum   . Generalized anxiety disorder 05/31/2016  . GERD (gastroesophageal reflux disease)   . Headache    sinus issues  . History of gastric ulcer    summer 2015  . Hypertension    has not been taking his medication; advised to start back on med. today  . Major depressive disorder, recurrent severe without psychotic features ([place]) 05/08/2017  . Nasal turbinate hypertrophy   . Osteoarthritis of shoulder 01/2014   right, arms, elbows, neck    Past Surgical History:  Procedure Laterality Date  . CHOLECYSTECTOMY    . COLONOSCOPY WITH PROPOFOL N/A 05/14/2016   Procedure: COLONOSCOPY WITH PROPOFOL;  Surgeon: Lucilla Lame, MD;  Location: [facility];  Service: Endoscopy;  Laterality: N/A;  . ENDOSCOPIC CONCHA BULLOSA RESECTION Left 12/29/2019   Procedure: ENDOSCOPIC CONCHA BULLOSA RESECTION;  Surgeon: Carloyn Manner, MD;  Location: [place];  Service: ENT;  Laterality: Left;  . ESOPHAGOGASTRODUODENOSCOPY (EGD) WITH PROPOFOL N/A 05/14/2016   Procedure: ESOPHAGOGASTRODUODENOSCOPY (EGD) WITH PROPOFOL;  Surgeon: Lucilla Lame, MD;  Location: [facility];  Service: Endoscopy;  Laterality: N/A;  . NASAL SEPTOPLASTY W/ TURBINOPLASTY Bilateral 12/29/2019   Procedure: NASAL SEPTOPLASTY WITH INFERIOR TURBINATE REDUCTION;  Surgeon: Carloyn Manner, MD;   Location: [place];  Service: ENT;  Laterality: Bilateral;  . RESECTION DISTAL CLAVICAL Right 02/21/2014   Procedure: RESECTION DISTAL CLAVICAL;  Surgeon: Nita Sells, MD;  Location: [place];  Service: Orthopedics;  Laterality: Right;  . SHOULDER ARTHROSCOPY WITH SUBACROMIAL DECOMPRESSION Right 02/21/2014   Procedure: SHOULDER ARTHROSCOPY WITH SUBACROMIAL DECOMPRESSION, DISTAL CLAVICAL EXCISION, DEBRIDIMENT OF PARTIAL ROTATOR CUFF TEAR;  Surgeon: Nita Sells, MD;  Location: [place];  Service: Orthopedics;  Laterality: Right;  Right shoulder arthroscopy subacromial decompression, distal clavical excision    Family History  Problem Relation Age of Onset  . Diabetes Mother   . Heart disease Mother   . Long QT syndrome Mother   . Lung disease Father   . Diabetes Father     Social History   Socioeconomic History  . Marital status: Married    Spouse name: Not on file  . Number of children: Not on file  . Years of education: Not on file  . Highest education level: Not on file  Occupational History  . 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 Smoking status: Former    Packs/day: 2.00    Years: 20.00    Pack years: 40.00    Types: Cigarettes    Quit date: 10/11/2020    Years since quitting: 0.7  . Smokeless tobacco: Never  Substance and Sexual Activity  . Alcohol use: Not Currently    Alcohol/week: 0.0 standard drinks  . Drug use: No  . Sexual activity: Yes    Partners: Female  Other Topics Concern  .  Not on file  Social History Narrative   Mom was in hospital with long QT syndrome, then heart in A Fib.   Sister has long QT syndrome   1st cousin: long QT syndrome         Social Determinants of Radio broadcast assistant Strain: Not on file  Food Insecurity: Not on file  Transportation Needs: Not on file  Physical Activity: Not on file  Stress: Not on file  Social Connections:  Not on file  Intimate Partner Violence: Not on file    Outpatient Medications Prior to Visit  Medication Sig Dispense Refill  . acetaminophen (TYLENOL) 650 MG CR tablet Take 650 mg by mouth every 8 (eight) hours as needed for pain.    Marland Kitchen amphetamine-dextroamphetamine (ADDERALL) 15 MG tablet TAKE 1 TABLET BY MOUTH TWO TIMES DAILY. 60 tablet 0  . fluticasone (FLONASE) 50 MCG/ACT nasal spray Place 2 sprays into both nostrils daily. 16 g 0  . hydrochlorothiazide (HYDRODIURIL) 25 MG tablet TAKE 1 TABLET BY MOUTH DAILY FOR HIGH BLOOD PRESSURE 90 tablet 1  . sildenafil (VIAGRA) 100 MG tablet Take 0.5-1 tablets (50-100 mg total) by mouth daily as needed for erectile dysfunction. 10 tablet 5  . ipratropium (ATROVENT) 0.03 % nasal spray SPRAY 2 SPRAYS IN EACH NOSTRIL 3 TIMES DAILY AS NEEDED FOR DRAINAGE (Patient not taking: Reported on 03/08/2021) 30 mL 11   No facility-administered medications prior to visit.    Allergies  Allergen Reactions  . Azithromycin Nausea And Vomiting  . Percocet [Oxycodone-Acetaminophen] Hives  . Vicodin [Hydrocodone-Acetaminophen] Hives  . Adhesive [Tape] Other (See Comments)    SKIN IRRITATION    ROS Review of Systems  Constitutional:  Negative for chills, fatigue and fever.  Respiratory:  Negative for cough and shortness of breath.   Cardiovascular:  Negative for chest pain, palpitations and leg swelling.  Skin:  Positive for rash (itchy rash all over body).     Objective:    Physical Exam Constitutional:      General: He is not in acute distress.    Appearance: Normal appearance. He is normal weight. He is not ill-appearing, toxic-appearing or diaphoretic.  Pulmonary:     Effort: Pulmonary effort is normal.  Skin:    Comments: Raised papular rash on bil hands, bil ue, bil le trunk and back   Neurological:     Mental Status: He is alert.    BP 140/82   Pulse 76   Temp 98.6 F (37 C) (Oral)   Ht 5' 5.75" (1.67 m)   Wt 180 lb (81.6 kg)   SpO2 97%    BMI 29.27 kg/m  Wt Readings from Last 3 Encounters:  07/06/21 180 lb (81.6 kg)  03/13/21 175 lb 8 oz (79.6 kg)  12/13/20 170 lb 8 oz (77.3 kg)     Health Maintenance Due  Topic Date Due  . COVID-19 Vaccine (3 - Pfizer risk series) 10/30/2019    There are no preventive care reminders to display for this patient.  Lab Results  Component Value Date   TSH 1.09 01/26/2014   Lab Results  Component Value Date   WBC 7.9 12/13/2020   HGB 15.5 12/13/2020   HCT 44.8 12/13/2020   MCV 88.1 12/13/2020   PLT 254.0 12/13/2020   Lab Results  Component Value Date   NA 140 12/13/2020   K 5.2 (H) 12/13/2020   CO2 30 12/13/2020   GLUCOSE 88 12/13/2020   BUN 11 12/13/2020   CREATININE  1.13 12/13/2020   BILITOT 0.4 12/13/2020   ALKPHOS 76 12/13/2020   AST 14 12/13/2020   ALT 20 12/13/2020   PROT 7.2 12/13/2020   ALBUMIN 4.8 12/13/2020   CALCIUM 9.6 12/13/2020   ANIONGAP 17 (H) 05/07/2017   GFR 79.81 12/13/2020   Lab Results  Component Value Date   HGBA1C 5.4 12/13/2020      Assessment & Plan:   Problem List Items Addressed This Visit       Musculoskeletal and Integument   Skin eruption    Suspected scabies rx elimite 5% cream        Relevant Medications   ELIMITE 5 % cream     Other   Scabies exposure - Primary    Handout given to pt Pt to clean home this weekend as discussed       Relevant Medications   ELIMITE 5 % cream    Meds ordered this encounter  Medications  . ELIMITE 5 % cream    Sig: Apply to entire skin from chin down to and including toes, also under fingernails and toenails. May require 30 g. Leave on 8-14 hours. Repeat in 1 week. Reapply to hands after handwashing.    Dispense:  60 g    Refill:  0    Order Specific Question:   Supervising Provider    Answer:   BEDSOLE, AMY E [[id number]]    Follow-up: Return if symptoms worsen or fail to improve with pcp.    Eugenia Pancoast, FNP

## 2021-07-11 DIAGNOSIS — Z207 Contact with and (suspected) exposure to pediculosis, acariasis and other infestations: Secondary | ICD-10-CM | POA: Insufficient documentation

## 2021-07-11 DIAGNOSIS — R21 Rash and other nonspecific skin eruption: Secondary | ICD-10-CM | POA: Insufficient documentation

## 2021-07-11 NOTE — Assessment & Plan Note (Signed)
Handout given to pt Pt to clean home this weekend as discussed

## 2021-07-11 NOTE — Assessment & Plan Note (Signed)
Suspected scabies rx elimite 5% cream

## 2021-07-17 ENCOUNTER — Encounter: Payer: Self-pay | Admitting: Family

## 2021-07-17 DIAGNOSIS — Z207 Contact with and (suspected) exposure to pediculosis, acariasis and other infestations: Secondary | ICD-10-CM

## 2021-07-17 DIAGNOSIS — B86 Scabies: Secondary | ICD-10-CM

## 2021-07-18 ENCOUNTER — Other Ambulatory Visit: Payer: Self-pay

## 2021-07-18 MED ORDER — IVERMECTIN 3 MG PO TABS
ORAL_TABLET | ORAL | 0 refills | Status: DC
  Filled 2021-07-18: qty 11, 14d supply, fill #0

## 2021-07-19 ENCOUNTER — Other Ambulatory Visit: Payer: Self-pay

## 2021-07-20 ENCOUNTER — Other Ambulatory Visit: Payer: Self-pay

## 2021-11-06 ENCOUNTER — Other Ambulatory Visit: Payer: Self-pay

## 2021-11-06 DIAGNOSIS — J0140 Acute pansinusitis, unspecified: Secondary | ICD-10-CM | POA: Diagnosis not present

## 2021-11-06 DIAGNOSIS — Z03818 Encounter for observation for suspected exposure to other biological agents ruled out: Secondary | ICD-10-CM | POA: Diagnosis not present

## 2021-11-06 MED ORDER — BENZONATATE 200 MG PO CAPS
ORAL_CAPSULE | ORAL | 0 refills | Status: DC
  Filled 2021-11-06: qty 20, 7d supply, fill #0

## 2021-11-06 MED ORDER — PREDNISONE 20 MG PO TABS
ORAL_TABLET | ORAL | 0 refills | Status: DC
  Filled 2021-11-06: qty 5, 5d supply, fill #0

## 2021-11-06 MED ORDER — FLUTICASONE PROPIONATE 50 MCG/ACT NA SUSP
NASAL | 0 refills | Status: DC
  Filled 2021-11-06: qty 16, 30d supply, fill #0

## 2021-11-06 MED ORDER — AMOXICILLIN-POT CLAVULANATE 875-125 MG PO TABS
ORAL_TABLET | ORAL | 0 refills | Status: DC
  Filled 2021-11-06: qty 14, 7d supply, fill #0

## 2021-11-22 ENCOUNTER — Encounter: Payer: Self-pay | Admitting: Family Medicine

## 2022-03-04 ENCOUNTER — Other Ambulatory Visit: Payer: Self-pay

## 2022-03-04 ENCOUNTER — Ambulatory Visit (INDEPENDENT_AMBULATORY_CARE_PROVIDER_SITE_OTHER): Payer: BC Managed Care – PPO | Admitting: Family Medicine

## 2022-03-04 ENCOUNTER — Encounter: Payer: Self-pay | Admitting: Family Medicine

## 2022-03-04 VITALS — BP 130/80 | HR 65 | Temp 98.1°F | Ht 68.0 in | Wt 161.1 lb

## 2022-03-04 DIAGNOSIS — M545 Low back pain, unspecified: Secondary | ICD-10-CM | POA: Diagnosis not present

## 2022-03-04 MED ORDER — CYCLOBENZAPRINE HCL 10 MG PO TABS
10.0000 mg | ORAL_TABLET | Freq: Every evening | ORAL | 1 refills | Status: DC | PRN
  Filled 2022-03-04: qty 30, 30d supply, fill #0

## 2022-03-04 MED ORDER — PREDNISONE 20 MG PO TABS
ORAL_TABLET | ORAL | 0 refills | Status: AC
  Filled 2022-03-04: qty 15, 10d supply, fill #0

## 2022-03-04 NOTE — Progress Notes (Unsigned)
Elma Shands T. Ayano Douthitt, MD, [facility] [address] [facility] at [facility] [place], [address]  Phone: [phone]  FAX: [phone]  LAKEEM ROZO - 47 y.o. male  MRN [id number]  Date of Birth: [birth year]  Date: 03/04/2022  PCP: [name], MD  Referral: [name], MD  Chief Complaint  Patient presents with   Back Pain    X 2 weeks   Subjective:   Raymond [name] is a 47 y.o. very pleasant male patient with Body mass index is 24.5 kg/m. who presents with the following:  He is a well-known patient who has a history of some back pain, but not any chronic history of prolonged back pain.  He presents today with some right greater than left back pain in the low back.  He also has some in the flank and posterior aspect of the lowest part of the thoracic spine.  This is predominantly parascapular inferiorly.  He has been doing a lot of climbing recently, and he thinks that this could have exacerbated his back.  He has relatively constant source of pain in the back, and he is radiating to the flank and occasionally into the buttocks region.  He denies any numbness or tingling in his legs.  No bowel or bladder incontinence.  Back pain - has been doing a lot of climbing.  Low back is sort of a constant pain and radiating.   Will sometimes wrap around his ribs.  Knotted up some on the R side.  Will go into the living room, cannot stay in the bed.     Review of Systems is noted in the HPI, as appropriate  Objective:   BP 130/80   Pulse 65   Temp 98.1 F (36.7 C) (Oral)   Ht '5\' 8"'$  (1.727 m)   Wt 161 lb 2 oz (73.1 kg)   SpO2 98%   BMI 24.50 kg/m   GEN: No acute distress; alert,appropriate. PULM: Breathing comfortably in no respiratory distress PSYCH: Normally interactive.   Range of motion at  the waist: Flexion, extension, lateral bending and rotation: Approaching full motion with some modest restriction in forward flexion  and extension.  No echymosis or edema Rises to examination table with mild difficulty Gait: minimally antalgic  Inspection/Deformity: N Paraspinus Tenderness: L3-S1, right greater than left.  He also has some pain in the parascapular region on the right side.  B Ankle Dorsiflexion (L5,4): 5/5 B Great Toe Dorsiflexion (L5,4): 5/5 Heel Walk (L5): WNL Toe Walk (S1): WNL Rise/Squat (L4): WNL, mild pain  SENSORY B Medial Foot (L4): WNL B Dorsum (L5): WNL B Lateral (S1): WNL Light Touch: WNL Pinprick: WNL  B SLR, seated: neg B SLR, supine: neg B FABER: neg B Reverse FABER: neg B Greater Troch: NT B Log Roll: neg B Sciatic Notch: NT   Laboratory and Imaging Data:  Assessment and Plan:     ICD-10-CM   1. Acute right-sided low back pain without sciatica  M54.50      Acute on chronic low back pain with exacerbation.  Some of this is on the lowest back, and additionally has some in the parascapular region which is tender to palpation.  Probably instigated by his increase in climbing recently.  I am going to have him relatively rest, give him a burst of some steroids and some Flexeril at nighttime to help as a muscle relaxant and to help him sleep a little bit better.  Medication Management  during today's office visit: Meds ordered this encounter  Medications   predniSONE (DELTASONE) 20 MG tablet    Sig: Take 2 tablets (40 mg total) by mouth daily for 5 days, THEN 1 tablet (20 mg total) daily for 5 days.    Dispense:  15 tablet    Refill:  0   cyclobenzaprine (FLEXERIL) 10 MG tablet    Sig: Take 1 tablet (10 mg total) by mouth at bedtime as needed for muscle spasms.    Dispense:  30 tablet    Refill:  1   Medications Discontinued During This Encounter  Medication Reason   fluticasone (FLONASE) 50 MCG/ACT nasal spray Completed Course   amoxicillin-clavulanate (AUGMENTIN) 875-125 MG tablet Completed Course   benzonatate (TESSALON) 200 MG capsule Completed Course    predniSONE (DELTASONE) 20 MG tablet Completed Course   acetaminophen (TYLENOL) 650 MG CR tablet No longer needed (for PRN medications)   amphetamine-dextroamphetamine (ADDERALL) 15 MG tablet Completed Course   sildenafil (VIAGRA) 100 MG tablet Completed Course   hydrochlorothiazide (HYDRODIURIL) 25 MG tablet Completed Course   ivermectin (STROMECTOL) 3 MG TABS tablet Completed Course   fluticasone (FLONASE) 50 MCG/ACT nasal spray Completed Course    Orders placed today for conditions managed today: No orders of the defined types were placed in this encounter.   Disposition: No follow-ups on file.  [employer] One speech-to-text software was used for transcription in this dictation.  Possible transcriptional errors can occur using Editor, commissioning.   Signed,  Maud Deed. Masae Lukacs, MD   Outpatient Encounter Medications as of 03/04/2022  Medication Sig   cyclobenzaprine (FLEXERIL) 10 MG tablet Take 1 tablet (10 mg total) by mouth at bedtime as needed for muscle spasms.   predniSONE (DELTASONE) 20 MG tablet Take 2 tablets (40 mg total) by mouth daily for 5 days, THEN 1 tablet (20 mg total) daily for 5 days.   [DISCONTINUED] acetaminophen (TYLENOL) 650 MG CR tablet Take 650 mg by mouth every 8 (eight) hours as needed for pain.   [DISCONTINUED] amoxicillin-clavulanate (AUGMENTIN) 875-125 MG tablet Take 1 tablet (875 mg total) by mouth every 12 (twelve) hours for 7 days   [DISCONTINUED] amphetamine-dextroamphetamine (ADDERALL) 15 MG tablet TAKE 1 TABLET BY MOUTH TWO TIMES DAILY.   [DISCONTINUED] benzonatate (TESSALON) 200 MG capsule Take 1 capsule (200 mg total) by mouth 3 (three) times daily as needed for Cough for up to 7 days   [DISCONTINUED] fluticasone (FLONASE) 50 MCG/ACT nasal spray Place 2 sprays into both nostrils daily.   [DISCONTINUED] fluticasone (FLONASE) 50 MCG/ACT nasal spray Place 1 spray into both nostrils 2 (two) times daily   [DISCONTINUED] hydrochlorothiazide (HYDRODIURIL)  25 MG tablet TAKE 1 TABLET BY MOUTH DAILY FOR HIGH BLOOD PRESSURE   [DISCONTINUED] ivermectin (STROMECTOL) 3 MG TABS tablet Take 5.5 tablets (total 16,500 mg) for one dose, repeat again in two weeks   [DISCONTINUED] predniSONE (DELTASONE) 20 MG tablet Take 1 tablet (20 mg total) by mouth once daily for 5 days   [DISCONTINUED] sildenafil (VIAGRA) 100 MG tablet Take 0.5-1 tablets (50-100 mg total) by mouth daily as needed for erectile dysfunction.   No facility-administered encounter medications on file as of 03/04/2022.

## 2022-10-03 ENCOUNTER — Encounter (INDEPENDENT_AMBULATORY_CARE_PROVIDER_SITE_OTHER): Payer: Self-pay

## 2022-10-04 ENCOUNTER — Encounter: Payer: Self-pay | Admitting: Family Medicine

## 2022-10-08 ENCOUNTER — Encounter: Payer: Self-pay | Admitting: Family Medicine

## 2022-10-08 NOTE — Progress Notes (Signed)
Raymond Wellen T. [name], MD, CAQ Sports Medicine [facility] at [facility] [address] [place], [address]  Phone: [phone]  FAX: [phone]  Raymond [name] - 47 y.o. male  MRN [id number]  Date of Birth: [birth year]  Date: 10/10/2022  PCP: Raymond Beat, MD  Referral: Raymond Beat, MD  Chief Complaint  Patient presents with   Annual Exam   Patient Care Team: Raymond Beat, MD as PCP - General (Family Medicine) Subjective:   Raymond [name] is a 47 y.o. pleasant patient who presents with the following:  Preventative Health Maintenance Visit:  Health Maintenance Summary Reviewed and updated, unless pt declines services.  Tobacco History Reviewed. Alcohol: No concerns, no excessive use Exercise Habits: Some activity, rec at least 30 mins 5 times a week - works all th etime.  Walks a lot on the weekend.  Climbs ladders a lot.  STD concerns: no risk or activity to increase risk Drug Use: None  Colon at 2028 needed update Annual flu Covid booster  Lost weight - 30+ pounds Cut out fast food  Feels like in his 20's - aches and pains are doing a lot better No alcohol at all now No cigs  Wt Readings from Last 3 Encounters:  10/10/22 150 lb 8 oz (68.3 kg)  03/04/22 161 lb 2 oz (73.1 kg)  07/06/21 180 lb (81.6 kg)    Health Maintenance  Topic Date Due   COVID-19 Vaccine (3 - Pfizer risk series) 10/30/2019   INFLUENZA VACCINE  05/19/2023 (Originally 09/19/2022)   DTaP/Tdap/Td (2 - Td or Tdap) 08/14/2026   Hepatitis C Screening  Completed   HIV Screening  Completed   HPV VACCINES  Aged Out   Immunization History  Administered Date(s) Administered   Influenza,inj,Quad PF,6+ Mos 11/18/2018, 12/09/2019, 12/13/2020   PFIZER(Purple Top)SARS-COV-2 Vaccination 09/11/2019, 10/02/2019   Tdap 08/13/2016   Patient Active Problem List   Diagnosis Date Noted   Alcohol use disorder, severe, dependence (HCC) 05/09/2017   Major depressive  disorder, recurrent severe without psychotic features (HCC) 05/08/2017   Generalized anxiety disorder 05/31/2016   Attention deficit hyperactivity disorder (ADHD), predominantly inattentive type 05/19/2014   HTN (hypertension) 04/06/2013   Allergic rhinitis 01/18/2013   Cardiac arrest-aborted 06/12/2012   Smoker 06/12/2012   Common migraine 05/26/2012   Fibromyalgia 05/26/2012    Past Medical History:  Diagnosis Date   Alcohol use disorder, severe, dependence (HCC) 05/09/2017   Attention deficit hyperactivity disorder (ADHD), predominantly inattentive type 05/19/2014   Generalized anxiety disorder 05/31/2016   GERD (gastroesophageal reflux disease)    History of gastric ulcer    summer 2015   Hypertension    Major depressive disorder, recurrent severe without psychotic features (HCC) 05/08/2017    Past Surgical History:  Procedure Laterality Date   CHOLECYSTECTOMY     COLONOSCOPY WITH PROPOFOL N/A 05/14/2016   Procedure: COLONOSCOPY WITH PROPOFOL;  Surgeon: Midge Minium, MD;  Location: [facility];  Service: Endoscopy;  Laterality: N/A;   ENDOSCOPIC CONCHA BULLOSA RESECTION Left 12/29/2019   Procedure: ENDOSCOPIC CONCHA BULLOSA RESECTION;  Surgeon: Bud Face, MD;  Location[facility];  Service: ENT;  Laterality: Left;   ESOPHAGOGASTRODUODENOSCOPY (EGD) WITH PROPOFOL N/A 05/14/2016   Procedure: ESOPHAGOGASTRODUODENOSCOPY (EGD) WITH PROPOFOL;  Surgeon: Midge Minium, MD;  Location: [facility];  Service: Endoscopy;  Laterality: N/A;   NASAL SEPTOPLASTY W/ TURBINOPLASTY Bilateral 12/29/2019   Procedure: NASAL SEPTOPLASTY WITH INFERIOR TURBINATE REDUCTION;  Surgeon: Bud Face, MD;  Location: [facility];  Service: ENT;  Laterality: Bilateral;   RESECTION DISTAL CLAVICAL Right 02/21/2014   Procedure: RESECTION DISTAL CLAVICAL;  Surgeon: Mable Paris, MD;  Location: [facility];  Service: Orthopedics;  Laterality: Right;    SHOULDER ARTHROSCOPY WITH SUBACROMIAL DECOMPRESSION Right 02/21/2014   Procedure: SHOULDER ARTHROSCOPY WITH SUBACROMIAL DECOMPRESSION, DISTAL CLAVICAL EXCISION, DEBRIDIMENT OF PARTIAL ROTATOR CUFF TEAR;  Surgeon: Mable Paris, MD;  Location: [facility];  Service: Orthopedics;  Laterality: Right;  Right shoulder arthroscopy subacromial decompression, distal clavical excision    Family History  Problem Relation Age of Onset   Diabetes Mother    Heart disease Mother    Long QT syndrome Mother    Lung disease Father    Diabetes Father     Social History   Social History Narrative   Mom was in hospital with long QT syndrome, then heart in A Fib.   Sister has long QT syndrome   1st cousin: long QT syndrome          Past Medical History, Surgical History, Social History, Family History, Problem List, Medications, and Allergies have been reviewed and updated if relevant.  Review of Systems: Pertinent positives are listed above.  Otherwise, a full 14 point review of systems has been done in full and it is negative except where it is noted positive.  Objective:   BP 110/86 (BP Location: Right Arm, Patient Position: Sitting, Cuff Size: Large)   Pulse (!) 53   Temp 98.3 F (36.8 C) (Temporal)   Ht 5' 7.25" (1.708 m)   Wt 150 lb 8 oz (68.3 kg)   SpO2 99%   BMI 23.40 kg/m  Ideal Body Weight: Weight in (lb) to have BMI = 25: 160.5  Ideal Body Weight: Weight in (lb) to have BMI = 25: 160.5 No results found.    03/04/2022   12:01 PM 12/09/2019    9:55 AM 08/23/2016   11:51 AM  Depression screen PHQ 2/9  Decreased Interest 0 0 0  Down, Depressed, Hopeless 0 0 0  PHQ - 2 Score 0 0 0  Altered sleeping  3   Tired, decreased energy  2   Change in appetite  0   Feeling bad or failure about yourself   0   Trouble concentrating  2   Moving slowly or fidgety/restless  0   Suicidal thoughts  0   PHQ-9 Score  7   Difficult doing work/chores  Not difficult at all       GEN: well developed, well nourished, no acute distress Eyes: conjunctiva and lids normal, PERRLA, EOMI ENT: TM clear, nares clear, oral exam WNL Neck: supple, no lymphadenopathy, no thyromegaly, no JVD Pulm: clear to auscultation and percussion, respiratory effort normal CV: regular rate and rhythm, S1-S2, no murmur, rub or gallop, no bruits, peripheral pulses normal and symmetric, no cyanosis, clubbing, edema or varicosities GI: soft, non-tender; no hepatosplenomegaly, masses; active bowel sounds all quadrants GU: deferred Lymph: no cervical, axillary or inguinal adenopathy MSK: gait normal, muscle tone and strength WNL, no joint swelling, effusions, discoloration, crepitus  SKIN: clear, good turgor, color WNL, no rashes, lesions, or ulcerations Neuro: normal mental status, normal strength, sensation, and motion Psych: alert; oriented to person, place and time, normally interactive and not anxious or depressed in appearance.  All labs reviewed with patient. Results for orders placed or performed in visit on 10/10/22  Basic metabolic panel  Result Value Ref Range   Sodium 138 135 - 145  mEq/L   Potassium 4.6 3.5 - 5.1 mEq/L   Chloride 105 96 - 112 mEq/L   CO2 27 19 - 32 mEq/L   Glucose, Bld 68 (L) 70 - 99 mg/dL   BUN 10 6 - 23 mg/dL   Creatinine, Ser 1.47 0.40 - 1.50 mg/dL   GFR 829.56 >21.30 mL/min   Calcium 9.4 8.4 - 10.5 mg/dL  CBC with Differential/Platelet  Result Value Ref Range   WBC 8.9 4.0 - 10.5 K/uL   RBC 4.91 4.22 - 5.81 Mil/uL   Hemoglobin 14.8 13.0 - 17.0 g/dL   HCT 86.5 78.4 - 69.6 %   MCV 89.6 78.0 - 100.0 fl   MCHC 33.7 30.0 - 36.0 g/dL   RDW 29.5 28.4 - 13.2 %   Platelets 252.0 150.0 - 400.0 K/uL   Neutrophils Relative % 56.7 43.0 - 77.0 %   Lymphocytes Relative 34.5 12.0 - 46.0 %   Monocytes Relative 7.0 3.0 - 12.0 %   Eosinophils Relative 1.3 0.0 - 5.0 %   Basophils Relative 0.5 0.0 - 3.0 %   Neutro Abs 5.1 1.4 - 7.7 K/uL   Lymphs Abs 3.1 0.7 -  4.0 K/uL   Monocytes Absolute 0.6 0.1 - 1.0 K/uL   Eosinophils Absolute 0.1 0.0 - 0.7 K/uL   Basophils Absolute 0.0 0.0 - 0.1 K/uL  Hepatic function panel  Result Value Ref Range   Total Bilirubin 0.9 0.2 - 1.2 mg/dL   Bilirubin, Direct 0.2 0.0 - 0.3 mg/dL   Alkaline Phosphatase 49 39 - 117 U/L   AST 17 0 - 37 U/L   ALT 14 0 - 53 U/L   Total Protein 7.6 6.0 - 8.3 g/dL   Albumin 4.7 3.5 - 5.2 g/dL  Hemoglobin G4W  Result Value Ref Range   Hgb A1c MFr Bld 5.1 4.6 - 6.5 %  Lipid panel  Result Value Ref Range   Cholesterol 180 0 - 200 mg/dL   Triglycerides 10.2 0.0 - 149.0 mg/dL   HDL 72.53 >66.44 mg/dL   VLDL 03.4 0.0 - 74.2 mg/dL   LDL Cholesterol 595 (H) 0 - 99 mg/dL   Total CHOL/HDL Ratio 4    NonHDL 136.17     Assessment and Plan:     ICD-10-CM   1. Healthcare maintenance  Z00.00     2. Screening, lipid  Z13.220 Lipid panel    3. Screening for diabetes mellitus  Z13.1 Basic metabolic panel    Hemoglobin A1c    4. Screening for malignant neoplasm of prostate  Z12.5 PSA, Total with Reflex to PSA, Free    5. Other fatigue  R53.83 CBC with Differential/Platelet    Hepatic function panel     Maccoy is really doing well, and he feels great after losing all of his weight.  He stopped drinking and smoking.  We are going to check all of his basic labs today.  At this point, they have returned and all look very good and reassuring.  Health Maintenance Exam: The patient's preventative maintenance and recommended screening tests for an annual wellness exam were reviewed in full today. Brought up to date unless services declined.  Counselled on the importance of diet, exercise, and its role in overall health and mortality. The patient's FH and SH was reviewed, including their home life, tobacco status, and drug and alcohol status.  Follow-up in 1 year for physical exam or additional follow-up below.  Disposition: No follow-ups on file.  No orders of the defined  types were  placed in this encounter.  Medications Discontinued During This Encounter  Medication Reason   cyclobenzaprine (FLEXERIL) 10 MG tablet No longer needed (for PRN medications)   Orders Placed This Encounter  Procedures   Basic metabolic panel   CBC with Differential/Platelet   Hepatic function panel   Hemoglobin A1c   Lipid panel   PSA, Total with Reflex to PSA, Free    Signed,  Forrest Jaroszewski T. Marquinn Meschke, MD   Allergies as of 10/10/2022       Reactions   Azithromycin Nausea And Vomiting   Percocet [oxycodone-acetaminophen] Hives   Vicodin [hydrocodone-acetaminophen] Hives   Adhesive [tape] Other (See Comments)   SKIN IRRITATION        Medication List        Accurate as of October 10, 2022 11:59 PM. If you have any questions, ask your nurse or doctor.          STOP taking these medications    cyclobenzaprine 10 MG tablet Commonly known as: FLEXERIL Stopped by: Karleen Hampshire Jarett Dralle

## 2022-10-09 ENCOUNTER — Other Ambulatory Visit: Payer: BC Managed Care – PPO

## 2022-10-10 ENCOUNTER — Encounter: Payer: Self-pay | Admitting: Family Medicine

## 2022-10-10 ENCOUNTER — Ambulatory Visit (INDEPENDENT_AMBULATORY_CARE_PROVIDER_SITE_OTHER): Payer: BC Managed Care – PPO | Admitting: Family Medicine

## 2022-10-10 VITALS — BP 110/86 | HR 53 | Temp 98.3°F | Ht 67.25 in | Wt 150.5 lb

## 2022-10-10 DIAGNOSIS — Z131 Encounter for screening for diabetes mellitus: Secondary | ICD-10-CM

## 2022-10-10 DIAGNOSIS — R5383 Other fatigue: Secondary | ICD-10-CM

## 2022-10-10 DIAGNOSIS — Z13220 Encounter for screening for lipoid disorders: Secondary | ICD-10-CM

## 2022-10-10 DIAGNOSIS — Z0000 Encounter for general adult medical examination without abnormal findings: Secondary | ICD-10-CM | POA: Diagnosis not present

## 2022-10-10 DIAGNOSIS — Z125 Encounter for screening for malignant neoplasm of prostate: Secondary | ICD-10-CM

## 2022-10-11 ENCOUNTER — Encounter: Payer: Self-pay | Admitting: Family Medicine

## 2022-10-11 LAB — CBC WITH DIFFERENTIAL/PLATELET
Basophils Absolute: 0 10*3/uL (ref 0.0–0.1)
Basophils Relative: 0.5 % (ref 0.0–3.0)
Eosinophils Absolute: 0.1 10*3/uL (ref 0.0–0.7)
Eosinophils Relative: 1.3 % (ref 0.0–5.0)
HCT: 44 % (ref 39.0–52.0)
Hemoglobin: 14.8 g/dL (ref 13.0–17.0)
Lymphocytes Relative: 34.5 % (ref 12.0–46.0)
Lymphs Abs: 3.1 10*3/uL (ref 0.7–4.0)
MCHC: 33.7 g/dL (ref 30.0–36.0)
MCV: 89.6 fl (ref 78.0–100.0)
Monocytes Absolute: 0.6 10*3/uL (ref 0.1–1.0)
Monocytes Relative: 7 % (ref 3.0–12.0)
Neutro Abs: 5.1 10*3/uL (ref 1.4–7.7)
Neutrophils Relative %: 56.7 % (ref 43.0–77.0)
Platelets: 252 10*3/uL (ref 150.0–400.0)
RBC: 4.91 Mil/uL (ref 4.22–5.81)
RDW: 13.6 % (ref 11.5–15.5)
WBC: 8.9 10*3/uL (ref 4.0–10.5)

## 2022-10-11 LAB — BASIC METABOLIC PANEL
BUN: 10 mg/dL (ref 6–23)
CO2: 27 meq/L (ref 19–32)
Calcium: 9.4 mg/dL (ref 8.4–10.5)
Chloride: 105 meq/L (ref 96–112)
Creatinine, Ser: 0.84 mg/dL (ref 0.40–1.50)
GFR: 105.71 mL/min (ref >60.00)
Glucose, Bld: 68 mg/dL — ABNORMAL LOW (ref 70–99)
Potassium: 4.6 meq/L (ref 3.5–5.1)
Sodium: 138 meq/L (ref 135–145)

## 2022-10-11 LAB — HEPATIC FUNCTION PANEL
ALT: 14 U/L (ref 0–53)
AST: 17 U/L (ref 0–37)
Albumin: 4.7 g/dL (ref 3.5–5.2)
Alkaline Phosphatase: 49 U/L (ref 39–117)
Bilirubin, Direct: 0.2 mg/dL (ref 0.0–0.3)
Total Bilirubin: 0.9 mg/dL (ref 0.2–1.2)
Total Protein: 7.6 g/dL (ref 6.0–8.3)

## 2022-10-11 LAB — LIPID PANEL
Cholesterol: 180 mg/dL (ref 0–200)
HDL: 44.1 mg/dL (ref >39.00)
LDL Cholesterol: 117 mg/dL — ABNORMAL HIGH (ref 0–99)
NonHDL: 136.17
Total CHOL/HDL Ratio: 4
Triglycerides: 98 mg/dL (ref 0.0–149.0)
VLDL: 19.6 mg/dL (ref 0.0–40.0)

## 2022-10-11 LAB — HEMOGLOBIN A1C: Hgb A1c MFr Bld: 5.1 % (ref 4.6–6.5)

## 2022-10-14 LAB — PSA, TOTAL WITH REFLEX TO PSA, FREE: PSA, Total: 0.4 ng/mL (ref <=4.0)

## 2022-10-24 ENCOUNTER — Other Ambulatory Visit: Payer: Self-pay

## 2022-10-24 ENCOUNTER — Other Ambulatory Visit: Payer: BC Managed Care – PPO

## 2022-10-24 DIAGNOSIS — S61432A Puncture wound without foreign body of left hand, initial encounter: Secondary | ICD-10-CM | POA: Diagnosis not present

## 2022-10-24 MED ORDER — DOXYCYCLINE HYCLATE 100 MG PO TABS
100.0000 mg | ORAL_TABLET | Freq: Two times a day (BID) | ORAL | 0 refills | Status: DC
  Filled 2022-10-24: qty 20, 10d supply, fill #0

## 2022-10-24 MED ORDER — MUPIROCIN 2 % EX OINT
1.0000 | TOPICAL_OINTMENT | Freq: Two times a day (BID) | CUTANEOUS | 0 refills | Status: DC
  Filled 2022-10-24: qty 22, 7d supply, fill #0

## 2022-10-30 ENCOUNTER — Encounter: Payer: BC Managed Care – PPO | Admitting: Family Medicine

## 2022-12-18 DIAGNOSIS — M65331 Trigger finger, right middle finger: Secondary | ICD-10-CM | POA: Diagnosis not present

## 2023-01-15 DIAGNOSIS — M79644 Pain in right finger(s): Secondary | ICD-10-CM | POA: Diagnosis not present

## 2023-01-15 DIAGNOSIS — M65331 Trigger finger, right middle finger: Secondary | ICD-10-CM | POA: Diagnosis not present

## 2023-01-28 ENCOUNTER — Other Ambulatory Visit: Payer: Self-pay

## 2023-01-28 ENCOUNTER — Encounter: Payer: Self-pay | Admitting: Dermatology

## 2023-01-28 ENCOUNTER — Ambulatory Visit (INDEPENDENT_AMBULATORY_CARE_PROVIDER_SITE_OTHER): Payer: 59 | Admitting: Dermatology

## 2023-01-28 DIAGNOSIS — R21 Rash and other nonspecific skin eruption: Secondary | ICD-10-CM | POA: Diagnosis not present

## 2023-01-28 MED ORDER — TRIAMCINOLONE ACETONIDE 0.1 % EX CREA
1.0000 | TOPICAL_CREAM | Freq: Two times a day (BID) | CUTANEOUS | 1 refills | Status: DC | PRN
Start: 2023-01-28 — End: 2023-09-11
  Filled 2023-01-28: qty 80, 40d supply, fill #0

## 2023-01-28 NOTE — Patient Instructions (Signed)

## 2023-01-28 NOTE — Progress Notes (Signed)
   Follow-Up Visit   Subjective  Raymond [name] is a 47 y.o. male who presents for the following: rash at arms, chest, legs, very itchy, started over a month ago. Rash does go away and comes back, uses tea tree oil which makes it go away quicker. No new products, uses all natural soaps. Rash ongoing and on and off since possible scabies may 2023. Back then daughter and wife were affected. Everyone received treatment and the others cleared but patient did not. [place] with salt water. 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  The patient has spots, moles and lesions to be evaluated, some may be new or changing and the patient may have concern these could be cancer.   The following portions of the chart were reviewed this encounter and updated as appropriate: medications, allergies, medical history  Review of Systems:  No other skin or systemic complaints except as noted in HPI or Assessment and Plan.  Objective  Well appearing patient in no apparent distress; mood and affect are within normal limits.     A focused examination was performed of the following areas: Abdomen, arms  Relevant exam findings are noted in the Assessment and Plan.    Assessment & Plan   Rash Exam: erythematous eczematous and urticarial papules on abdomen, proximal upper arms, axillae. Erythematous telangiectatic patches on lower legs  Differential diagnosis:  Type IV Hypersensitivity vs atopic dermatitis  Treatment Plan: Start TMC 0.1% cream twice daily until clear for up to 2 weeks. Avoid applying to face, groin, and axilla. Use as directed. Long-term use can cause thinning of the skin. Difficult to definitively diagnose scabies. Sometimes other people in household do not react Offered patient oral ivermectin to rule out/empirically treat scabies, patient defers and would prefer to treat topically.   Rash and other nonspecific skin eruption  Related  Medications triamcinolone cream (KENALOG) 0.1 % Apply 1 Application topically 2 (two) times daily as needed (Rash). Avoid applying to face, groin, and axilla. Use as directed. Long-term use can cause thinning of the skin.    Return in about 4 weeks (around 02/25/2023) for Rash.  Anise Salvo, RMA, am acting as scribe for Elie Goody, MD .   Documentation: I have reviewed the above documentation for accuracy and completeness, and I agree with the above.  Elie Goody, MD

## 2023-02-20 DIAGNOSIS — S66801A Unspecified injury of other specified muscles, fascia and tendons at wrist and hand level, right hand, initial encounter: Secondary | ICD-10-CM | POA: Diagnosis not present

## 2023-02-20 DIAGNOSIS — M79644 Pain in right finger(s): Secondary | ICD-10-CM | POA: Diagnosis not present

## 2023-02-20 DIAGNOSIS — M65331 Trigger finger, right middle finger: Secondary | ICD-10-CM | POA: Diagnosis not present

## 2023-03-04 ENCOUNTER — Ambulatory Visit: Payer: 59 | Admitting: Dermatology

## 2023-09-11 ENCOUNTER — Encounter: Payer: Self-pay | Admitting: Family Medicine

## 2023-09-11 ENCOUNTER — Ambulatory Visit (INDEPENDENT_AMBULATORY_CARE_PROVIDER_SITE_OTHER): Admitting: Family Medicine

## 2023-09-11 VITALS — BP 112/80 | HR 60 | Temp 97.8°F | Ht 67.25 in | Wt 152.4 lb

## 2023-09-11 DIAGNOSIS — M545 Low back pain, unspecified: Secondary | ICD-10-CM

## 2023-09-11 NOTE — Progress Notes (Unsigned)
     Zoiey Christy T. Gentle Hoge, MD, CAQ Sports Medicine [facility] at [facility] [address] [place], [address]  Phone: [phone]  FAX: [phone]  MEL LANGAN - 48 y.o. male  MRN [id number]  Date of Birth: Oct 20, 1975  Date: 09/11/2023  PCP: Watt Mirza, MD  Referral: Watt Mirza, MD  Chief Complaint  Patient presents with   Back Pain   Subjective:   Raymond [name] is a 48 y.o. very pleasant male patient with Body mass index is 23.69 kg/m. 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 PULM: Breathing comfortably in no respiratory distress PSYCH: Normally interactive.   Laboratory and Imaging Data:  Assessment and Plan:   ***
